# Patient Record
Sex: Female | Born: 1950
Health system: Southern US, Community
[De-identification: ages and names within clinical notes are randomized; demographics above are authoritative.]

## PROBLEM LIST (undated history)

## (undated) DIAGNOSIS — J45909 Unspecified asthma, uncomplicated: Secondary | ICD-10-CM

## (undated) DIAGNOSIS — E669 Obesity, unspecified: Secondary | ICD-10-CM

## (undated) DIAGNOSIS — K219 Gastro-esophageal reflux disease without esophagitis: Secondary | ICD-10-CM

## (undated) DIAGNOSIS — I10 Essential (primary) hypertension: Secondary | ICD-10-CM

## (undated) DIAGNOSIS — F329 Major depressive disorder, single episode, unspecified: Secondary | ICD-10-CM

## (undated) DIAGNOSIS — K5730 Diverticulosis of large intestine without perforation or abscess without bleeding: Secondary | ICD-10-CM

## (undated) DIAGNOSIS — G500 Trigeminal neuralgia: Secondary | ICD-10-CM

## (undated) DIAGNOSIS — K8020 Calculus of gallbladder without cholecystitis without obstruction: Secondary | ICD-10-CM

## (undated) DIAGNOSIS — C44311 Basal cell carcinoma of skin of nose: Secondary | ICD-10-CM

## (undated) DIAGNOSIS — F32A Depression, unspecified: Secondary | ICD-10-CM

## (undated) DIAGNOSIS — G4730 Sleep apnea, unspecified: Secondary | ICD-10-CM

## (undated) DIAGNOSIS — K635 Polyp of colon: Secondary | ICD-10-CM

## (undated) DIAGNOSIS — F419 Anxiety disorder, unspecified: Secondary | ICD-10-CM

## (undated) HISTORY — PX: ABDOMINAL HYSTERECTOMY: SHX81

## (undated) HISTORY — DX: Major depressive disorder, single episode, unspecified: F32.9

## (undated) HISTORY — DX: Depression, unspecified: F32.A

## (undated) HISTORY — PX: UPPER GASTROINTESTINAL ENDOSCOPY: SHX188

## (undated) HISTORY — DX: Calculus of gallbladder without cholecystitis without obstruction: K80.20

## (undated) HISTORY — DX: Unspecified asthma, uncomplicated: J45.909

## (undated) HISTORY — DX: Basal cell carcinoma of skin of nose: C44.311

## (undated) HISTORY — PX: COLONOSCOPY: SHX174

## (undated) HISTORY — DX: Essential (primary) hypertension: I10

## (undated) HISTORY — DX: Gastro-esophageal reflux disease without esophagitis: K21.9

## (undated) HISTORY — DX: Polyp of colon: K63.5

## (undated) HISTORY — DX: Sleep apnea, unspecified: G47.30

## (undated) HISTORY — PX: EYE SURGERY: SHX253

## (undated) HISTORY — DX: Obesity, unspecified: E66.9

## (undated) HISTORY — DX: Anxiety disorder, unspecified: F41.9

## (undated) HISTORY — DX: Diverticulosis of large intestine without perforation or abscess without bleeding: K57.30

## (undated) HISTORY — DX: Trigeminal neuralgia: G50.0

---

## 1999-08-17 HISTORY — PX: TOTAL ABDOMINAL HYSTERECTOMY W/ BILATERAL SALPINGOOPHORECTOMY: SHX83

## 1999-10-26 ENCOUNTER — Other Ambulatory Visit: Admission: RE | Admit: 1999-10-26 | Discharge: 1999-10-26 | Payer: Self-pay | Admitting: Obstetrics and Gynecology

## 2000-08-16 HISTORY — PX: CHOLECYSTECTOMY: SHX55

## 2000-09-29 ENCOUNTER — Encounter: Payer: Self-pay | Admitting: Internal Medicine

## 2000-11-29 ENCOUNTER — Other Ambulatory Visit: Admission: RE | Admit: 2000-11-29 | Discharge: 2000-11-29 | Payer: Self-pay | Admitting: Orthopedic Surgery

## 2001-02-02 ENCOUNTER — Other Ambulatory Visit: Admission: RE | Admit: 2001-02-02 | Discharge: 2001-02-02 | Payer: Self-pay | Admitting: Obstetrics and Gynecology

## 2003-10-14 ENCOUNTER — Ambulatory Visit (HOSPITAL_COMMUNITY): Admission: RE | Admit: 2003-10-14 | Discharge: 2003-10-14 | Payer: Self-pay | Admitting: Family Medicine

## 2003-10-14 ENCOUNTER — Encounter: Payer: Self-pay | Admitting: Family Medicine

## 2004-09-08 ENCOUNTER — Ambulatory Visit: Payer: Self-pay | Admitting: Family Medicine

## 2004-09-15 ENCOUNTER — Ambulatory Visit: Payer: Self-pay | Admitting: Family Medicine

## 2004-09-30 ENCOUNTER — Ambulatory Visit: Payer: Self-pay | Admitting: Internal Medicine

## 2004-11-23 ENCOUNTER — Ambulatory Visit: Payer: Self-pay | Admitting: Family Medicine

## 2004-11-25 ENCOUNTER — Encounter: Admission: RE | Admit: 2004-11-25 | Discharge: 2004-11-25 | Payer: Self-pay | Admitting: Family Medicine

## 2004-12-11 ENCOUNTER — Ambulatory Visit (HOSPITAL_COMMUNITY): Admission: RE | Admit: 2004-12-11 | Discharge: 2004-12-11 | Payer: Self-pay | Admitting: Neurology

## 2006-01-26 ENCOUNTER — Ambulatory Visit: Payer: Self-pay | Admitting: Family Medicine

## 2006-05-10 ENCOUNTER — Ambulatory Visit: Payer: Self-pay | Admitting: Family Medicine

## 2007-04-12 ENCOUNTER — Telehealth: Payer: Self-pay | Admitting: Family Medicine

## 2007-05-17 ENCOUNTER — Ambulatory Visit: Payer: Self-pay | Admitting: Family Medicine

## 2007-05-17 LAB — CONVERTED CEMR LAB
ALT: 23 units/L (ref 0–35)
AST: 22 units/L (ref 0–37)
Albumin: 4.2 g/dL (ref 3.5–5.2)
Alkaline Phosphatase: 58 units/L (ref 39–117)
BUN: 11 mg/dL (ref 6–23)
Basophils Absolute: 0 10*3/uL (ref 0.0–0.1)
Basophils Relative: 0.5 % (ref 0.0–1.0)
Bilirubin Urine: NEGATIVE
Bilirubin, Direct: 0.1 mg/dL (ref 0.0–0.3)
CO2: 31 meq/L (ref 19–32)
Calcium: 9.6 mg/dL (ref 8.4–10.5)
Chloride: 106 meq/L (ref 96–112)
Cholesterol: 207 mg/dL (ref 0–200)
Creatinine, Ser: 0.7 mg/dL (ref 0.4–1.2)
Direct LDL: 125.3 mg/dL
Eosinophils Absolute: 0.1 10*3/uL (ref 0.0–0.6)
Eosinophils Relative: 1.4 % (ref 0.0–5.0)
GFR calc Af Amer: 111 mL/min
GFR calc non Af Amer: 92 mL/min
Glucose, Bld: 94 mg/dL (ref 70–99)
Glucose, Urine, Semiquant: NEGATIVE
HCT: 36.7 % (ref 36.0–46.0)
HDL: 34.8 mg/dL — ABNORMAL LOW (ref >39.0)
Hemoglobin: 12.7 g/dL (ref 12.0–15.0)
Ketones, urine, test strip: NEGATIVE
Lymphocytes Relative: 20.7 % (ref 12.0–46.0)
MCHC: 34.7 g/dL (ref 30.0–36.0)
MCV: 83.7 fL (ref 78.0–100.0)
Monocytes Absolute: 0.4 10*3/uL (ref 0.2–0.7)
Monocytes Relative: 5.5 % (ref 3.0–11.0)
Neutro Abs: 4.8 10*3/uL (ref 1.4–7.7)
Neutrophils Relative %: 71.9 % (ref 43.0–77.0)
Nitrite: NEGATIVE
Platelets: 248 10*3/uL (ref 150–400)
Potassium: 4.7 meq/L (ref 3.5–5.1)
Protein, U semiquant: NEGATIVE
RBC: 4.39 M/uL (ref 3.87–5.11)
RDW: 13.2 % (ref 11.5–14.6)
Sodium: 143 meq/L (ref 135–145)
Specific Gravity, Urine: <1.005
TSH: 1.28 microintl units/mL (ref 0.35–5.50)
Total Bilirubin: 0.8 mg/dL (ref 0.3–1.2)
Total CHOL/HDL Ratio: 5.9
Total Protein: 6.8 g/dL (ref 6.0–8.3)
Triglycerides: 285 mg/dL (ref 0–149)
Urobilinogen, UA: 0.2
VLDL: 57 mg/dL — ABNORMAL HIGH (ref 0–40)
WBC: 6.7 10*3/uL (ref 4.5–10.5)
pH: 5.5

## 2007-05-24 ENCOUNTER — Ambulatory Visit: Payer: Self-pay | Admitting: Family Medicine

## 2007-05-24 DIAGNOSIS — R635 Abnormal weight gain: Secondary | ICD-10-CM | POA: Insufficient documentation

## 2007-05-24 DIAGNOSIS — F411 Generalized anxiety disorder: Secondary | ICD-10-CM | POA: Insufficient documentation

## 2007-05-24 DIAGNOSIS — F32A Depression, unspecified: Secondary | ICD-10-CM | POA: Insufficient documentation

## 2007-05-24 DIAGNOSIS — F329 Major depressive disorder, single episode, unspecified: Secondary | ICD-10-CM

## 2007-05-24 DIAGNOSIS — K5730 Diverticulosis of large intestine without perforation or abscess without bleeding: Secondary | ICD-10-CM | POA: Insufficient documentation

## 2008-01-29 ENCOUNTER — Ambulatory Visit: Payer: Self-pay | Admitting: Family Medicine

## 2008-04-01 ENCOUNTER — Encounter: Payer: Self-pay | Admitting: Family Medicine

## 2008-06-12 ENCOUNTER — Telehealth: Payer: Self-pay | Admitting: Family Medicine

## 2008-07-16 ENCOUNTER — Ambulatory Visit: Payer: Self-pay | Admitting: Family Medicine

## 2008-07-16 ENCOUNTER — Telehealth: Payer: Self-pay | Admitting: Family Medicine

## 2008-07-29 ENCOUNTER — Encounter: Payer: Self-pay | Admitting: *Deleted

## 2008-08-19 ENCOUNTER — Ambulatory Visit: Payer: Self-pay | Admitting: Family Medicine

## 2008-08-19 DIAGNOSIS — G500 Trigeminal neuralgia: Secondary | ICD-10-CM | POA: Insufficient documentation

## 2008-08-22 ENCOUNTER — Telehealth: Payer: Self-pay | Admitting: Family Medicine

## 2008-10-02 ENCOUNTER — Ambulatory Visit: Payer: Self-pay | Admitting: Family Medicine

## 2008-10-02 LAB — CONVERTED CEMR LAB
ALT: 21 units/L (ref 0–35)
AST: 21 units/L (ref 0–37)
Albumin: 4.1 g/dL (ref 3.5–5.2)
Alkaline Phosphatase: 63 units/L (ref 39–117)
BUN: 9 mg/dL (ref 6–23)
Basophils Absolute: 0 10*3/uL (ref 0.0–0.1)
Basophils Relative: 0.2 % (ref 0.0–3.0)
Bilirubin Urine: NEGATIVE
Bilirubin, Direct: 0.1 mg/dL (ref 0.0–0.3)
CO2: 26 meq/L (ref 19–32)
Calcium: 9.3 mg/dL (ref 8.4–10.5)
Chloride: 103 meq/L (ref 96–112)
Cholesterol: 192 mg/dL (ref 0–200)
Creatinine, Ser: 0.6 mg/dL (ref 0.4–1.2)
Direct LDL: 109.2 mg/dL
Eosinophils Absolute: 0.1 10*3/uL (ref 0.0–0.7)
Eosinophils Relative: 1.6 % (ref 0.0–5.0)
GFR calc Af Amer: 132 mL/min
GFR calc non Af Amer: 109 mL/min
Glucose, Bld: 91 mg/dL (ref 70–99)
Glucose, Urine, Semiquant: NEGATIVE
HCT: 39.3 % (ref 36.0–46.0)
HDL: 34.8 mg/dL — ABNORMAL LOW (ref >39.0)
Hemoglobin: 13.3 g/dL (ref 12.0–15.0)
Ketones, urine, test strip: NEGATIVE
Lymphocytes Relative: 20.3 % (ref 12.0–46.0)
MCHC: 33.9 g/dL (ref 30.0–36.0)
MCV: 85.8 fL (ref 78.0–100.0)
Monocytes Absolute: 0.4 10*3/uL (ref 0.1–1.0)
Monocytes Relative: 6.9 % (ref 3.0–12.0)
Neutro Abs: 4 10*3/uL (ref 1.4–7.7)
Neutrophils Relative %: 71 % (ref 43.0–77.0)
Nitrite: NEGATIVE
Platelets: 218 10*3/uL (ref 150–400)
Potassium: 3.9 meq/L (ref 3.5–5.1)
Protein, U semiquant: NEGATIVE
RBC: 4.58 M/uL (ref 3.87–5.11)
RDW: 12.9 % (ref 11.5–14.6)
Sodium: 139 meq/L (ref 135–145)
Specific Gravity, Urine: 1.01
TSH: 1.12 microintl units/mL (ref 0.35–5.50)
Total Bilirubin: 0.7 mg/dL (ref 0.3–1.2)
Total CHOL/HDL Ratio: 5.5
Total Protein: 7.3 g/dL (ref 6.0–8.3)
Triglycerides: 208 mg/dL (ref 0–149)
Urobilinogen, UA: 0.2
VLDL: 42 mg/dL — ABNORMAL HIGH (ref 0–40)
WBC: 5.7 10*3/uL (ref 4.5–10.5)
pH: 5

## 2008-10-11 ENCOUNTER — Ambulatory Visit: Payer: Self-pay | Admitting: Family Medicine

## 2009-09-01 ENCOUNTER — Telehealth: Payer: Self-pay | Admitting: Family Medicine

## 2009-11-11 ENCOUNTER — Telehealth: Payer: Self-pay | Admitting: Family Medicine

## 2009-11-26 ENCOUNTER — Ambulatory Visit: Payer: Self-pay | Admitting: Family Medicine

## 2009-11-26 LAB — CONVERTED CEMR LAB
ALT: 16 units/L (ref 0–35)
AST: 15 units/L (ref 0–37)
Albumin: 4.2 g/dL (ref 3.5–5.2)
Alkaline Phosphatase: 53 units/L (ref 39–117)
BUN: 13 mg/dL (ref 6–23)
Basophils Absolute: 0 10*3/uL (ref 0.0–0.1)
Basophils Relative: 0.3 % (ref 0.0–3.0)
Bilirubin Urine: NEGATIVE
Bilirubin, Direct: 0.1 mg/dL (ref 0.0–0.3)
CO2: 29 meq/L (ref 19–32)
Calcium: 9.1 mg/dL (ref 8.4–10.5)
Chloride: 104 meq/L (ref 96–112)
Cholesterol: 178 mg/dL (ref 0–200)
Creatinine, Ser: 0.5 mg/dL (ref 0.4–1.2)
Direct LDL: 63.1 mg/dL
Eosinophils Absolute: 0.1 10*3/uL (ref 0.0–0.7)
Eosinophils Relative: 1.4 % (ref 0.0–5.0)
GFR calc non Af Amer: 133.62 mL/min (ref >60)
Glucose, Bld: 97 mg/dL (ref 70–99)
Glucose, Urine, Semiquant: NEGATIVE
HCT: 35.3 % — ABNORMAL LOW (ref 36.0–46.0)
HDL: 35.4 mg/dL — ABNORMAL LOW (ref >39.00)
Hemoglobin: 12.3 g/dL (ref 12.0–15.0)
Ketones, urine, test strip: NEGATIVE
Lymphocytes Relative: 24.1 % (ref 12.0–46.0)
Lymphs Abs: 1.5 10*3/uL (ref 0.7–4.0)
MCHC: 35 g/dL (ref 30.0–36.0)
MCV: 83.9 fL (ref 78.0–100.0)
Monocytes Absolute: 0.3 10*3/uL (ref 0.1–1.0)
Monocytes Relative: 5.2 % (ref 3.0–12.0)
Neutro Abs: 4.2 10*3/uL (ref 1.4–7.7)
Neutrophils Relative %: 69 % (ref 43.0–77.0)
Nitrite: NEGATIVE
Platelets: 202 10*3/uL (ref 150.0–400.0)
Potassium: 4 meq/L (ref 3.5–5.1)
Protein, U semiquant: NEGATIVE
RBC: 4.21 M/uL (ref 3.87–5.11)
RDW: 13.9 % (ref 11.5–14.6)
Sodium: 139 meq/L (ref 135–145)
Specific Gravity, Urine: 1.02
TSH: 1.09 microintl units/mL (ref 0.35–5.50)
Total Bilirubin: 0.5 mg/dL (ref 0.3–1.2)
Total CHOL/HDL Ratio: 5
Total Protein: 7.2 g/dL (ref 6.0–8.3)
Triglycerides: 533 mg/dL — ABNORMAL HIGH (ref 0.0–149.0)
Urobilinogen, UA: 0.2
VLDL: 106.6 mg/dL — ABNORMAL HIGH (ref 0.0–40.0)
WBC Urine, dipstick: NEGATIVE
WBC: 6 10*3/uL (ref 4.5–10.5)
pH: 5

## 2009-12-03 ENCOUNTER — Ambulatory Visit: Payer: Self-pay | Admitting: Family Medicine

## 2009-12-03 ENCOUNTER — Encounter (INDEPENDENT_AMBULATORY_CARE_PROVIDER_SITE_OTHER): Payer: Self-pay | Admitting: *Deleted

## 2009-12-03 DIAGNOSIS — G4733 Obstructive sleep apnea (adult) (pediatric): Secondary | ICD-10-CM | POA: Insufficient documentation

## 2009-12-15 ENCOUNTER — Ambulatory Visit: Payer: Self-pay | Admitting: Family Medicine

## 2009-12-17 ENCOUNTER — Ambulatory Visit: Payer: Self-pay | Admitting: Family Medicine

## 2009-12-17 ENCOUNTER — Telehealth: Payer: Self-pay | Admitting: Family Medicine

## 2009-12-19 ENCOUNTER — Telehealth: Payer: Self-pay | Admitting: Internal Medicine

## 2009-12-19 ENCOUNTER — Ambulatory Visit: Payer: Self-pay | Admitting: Family Medicine

## 2009-12-19 DIAGNOSIS — K219 Gastro-esophageal reflux disease without esophagitis: Secondary | ICD-10-CM | POA: Insufficient documentation

## 2009-12-19 DIAGNOSIS — K449 Diaphragmatic hernia without obstruction or gangrene: Secondary | ICD-10-CM | POA: Insufficient documentation

## 2009-12-19 DIAGNOSIS — Z860100 Personal history of colon polyps, unspecified: Secondary | ICD-10-CM | POA: Insufficient documentation

## 2009-12-19 DIAGNOSIS — K649 Unspecified hemorrhoids: Secondary | ICD-10-CM | POA: Insufficient documentation

## 2009-12-25 ENCOUNTER — Ambulatory Visit: Payer: Self-pay | Admitting: Internal Medicine

## 2010-06-10 ENCOUNTER — Encounter (INDEPENDENT_AMBULATORY_CARE_PROVIDER_SITE_OTHER): Payer: Self-pay | Admitting: *Deleted

## 2010-06-11 LAB — HM MAMMOGRAPHY: HM Mammogram: NORMAL

## 2010-06-12 ENCOUNTER — Encounter: Payer: Self-pay | Admitting: Family Medicine

## 2010-07-15 ENCOUNTER — Encounter (INDEPENDENT_AMBULATORY_CARE_PROVIDER_SITE_OTHER): Payer: Self-pay | Admitting: *Deleted

## 2010-07-16 ENCOUNTER — Ambulatory Visit: Payer: Self-pay | Admitting: Internal Medicine

## 2010-07-28 ENCOUNTER — Ambulatory Visit: Payer: Self-pay | Admitting: Internal Medicine

## 2010-07-29 ENCOUNTER — Encounter: Payer: Self-pay | Admitting: Internal Medicine

## 2010-07-30 LAB — HM COLONOSCOPY

## 2010-09-05 ENCOUNTER — Encounter: Payer: Self-pay | Admitting: Family Medicine

## 2010-09-14 NOTE — Progress Notes (Addendum)
Summary: TRIAGE: Diverticulitits    Phone Note Call from Patient Call back at Home Phone [phone]   Call For: Dr Juanda Chance Reason for Call: Talk to Nurse Summary of Call: Really bad diverticulitis attack wonders if she can be worked in. 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 Initial call taken by: Leanor Kail [facility],  Dec 19, 2009 12:00 PM  Follow-up for Phone Call        Pt. can see Mike Gip [facility] today at 2:30pm.  Message left for patient to callback.  Follow-up by: Laureen Ochs LPN,  Dec 20, 2950 12:06 PM  Additional Follow-up for Phone Call Additional follow up Details #1::        Pt. states she has had diverticulitis and is on Cipro/Flagyl from her PCP, she is feeling much better, just wants to schedule a follow-up w/Dr.Colleen Donahoe to discuss possible surgery. She is scheduled for 12-25-09 at 4pm. Pt. instructed to call back as needed.  Additional Follow-up by: Laureen Ochs LPN,  Dec 20, 8411 12:46 PM

## 2010-09-14 NOTE — Miscellaneous (Addendum)
Summary: mammogram update   Clinical Lists Changes  Observations: Added new observation of MAMMOGRAM: normal (06/11/2010 11:06)      Preventive Care Screening  Mammogram:    Date:  06/11/2010    Results:  normal

## 2010-09-14 NOTE — Progress Notes (Addendum)
Summary: lexapro refill  Phone Note Refill Request Message from:  Fax from Pharmacy on August 22, 2008 11:04 AM  Refills Requested: Medication #1:  LEXAPRO 10 MG TABS Take 1 tablet by mouth once a day Initial call taken by: Kern Reap CMA,  August 22, 2008 11:04 AM      Prescriptions: LEXAPRO 10 MG TABS (ESCITALOPRAM OXALATE) Take 1 tablet by mouth once a day  #100 x 1   Entered by:   Kern Reap CMA   Authorized by:   Roderick Pee MD   Signed by:   Kern Reap CMA on 08/22/2008   Method used:   F[facility]ed to ...       [facility]       [address]       [place], [place]  [address]       Ph: [phone] or [phone]       Fax: [phone]   RxID:   [id number]

## 2010-09-14 NOTE — Assessment & Plan Note (Addendum)
Summary: Gastroenterology  Krystal Monroe   MR#:  [id number] Page #2  NAME:  Krystal Monroe, Krystal Monroe  OFFICE NO:  [id number]  DATE: 09/30/04  DOB: Jul 08, 2051  The patient is a 60 year old white female who is here today at Dr. Nelida Meuse recommendation because of intermittent discomfort in her chest when swallowing, suggestive of odynophagia.  She is aware of the food going down and there is actually a hesitation of the food at times, although there has never been any regurgitation of the food.  This has occurred during the day after meals, about once a week to once a month.  About a year ago, she was started on Nexium, which she took with complete resolution on her symptoms, but she discontinued the medicine.  This time Dr. Tawanna Cooler put her on Protonix 40 a day and she has been on it for the past 2 weeks.  She is not sure yet whether it has improved her symptoms.  Krystal Monroe was seen in the past in our office for gastroesophageal reflux in 1996 by Sharrell Ku, and actually underwent an upper endoscopy, which showed a 3 cm hiatal hernia.  There was no endoscopic evidence of stricture.  She since then has had some symptoms of reflux, although she does not have true heartburn during the day.  Her symptoms are more subtle.  She also had a cholecystectomy in 1996 by Dr. Orpah Greek and had 2 colonoscopies, one in 1996 for Hemoccult-positive stool and one in February 2002 with finding of a left colon polyp which was removed but not retrieved.  She is due for a colonoscopy' in February 2007.  PHYSICAL EXAMINATION:  Blood pressure 110/62, pulse 70, weight 172 pounds.  Her abdominal exam showed soft abdomen with no palpable mass or tenderness in epigastrium, liver edge was at margin costal margin.  Bowel sounds were normoactive.  Lower abdomen was unremarkable.  IMPRESSION:  A 60 year old white female with intermittent odynophagia suggestive of gastroesophageal reflux and possibly mild stricture.  She has a documented history of a hiatal hernia, and  I am suspecting that she has a fibrous ring at the gastroesophageal junction, which is partially obstructing at times depending on what she eats and how fast she eats.  Her eating habits are not very good.  She often eats very late and that may be precipitating more acid reflux.  Since she is not experiencing a heartburn during the day, I feel that her disease is more of a silent nocturnal reflux.   She feels that her symptoms are not severe enough to warrant any further evaluation at this time, but would be willing to have an endoscopy done at the time of her next colonoscopy, which would be in February 2007.  PLAN: 1.  Nexium 40 mg p.o. q. d.  The patient felt that Nexium was helping her more than Protonix. 2.  Colonoscopy scheduled for February 2007. 3.  Antireflux measures which include head of the bed elevation and eating early in the evening. 5.  Upper endoscopy at the time of colonoscopy.  If this symptom becomes more bothersome, we will proceed with endoscopy prior to that.  At the time of endoscopy, we would plan to obtain biopsies at the gastroesophageal junction to rule out Barrett's esophagus.      Hedwig Morton. Juanda Chance, M.D.  DMB/kl/cc cc:  Alonza Smoker, M.D. D:  09/30/04; T:  10/01/04; Job [phone]

## 2010-09-14 NOTE — Letter (Addendum)
Summary: Previsit letter  [facility]  [address] [place], [place] [address]   Phone: [phone]  Fax: [phone]       12/03/2009 MRN: [id number]  [facility] [address] [place], [place]  [address]  Dear Ms. Faulds,  Welcome to the Gastroenterology Division at [facility].    You are scheduled to see a nurse for your pre-procedure visit on 12-16-09 at 1:30p.m. on the 3rd floor at [facility], [address]  We ask that you try to arrive at our office 15 minutes prior to your appointment time to allow for check-in.  Your nurse visit will consist of discussing your medical and surgical history, your immediate family medical history, and your medications.    Please bring a complete list of all your medications or, if you prefer, bring the medication bottles and we will list them.  We will need to be aware of both prescribed and over the counter drugs.  We will need to know exact dosage information as well.  If you are on blood thinners (Coumadin, Plavix, Aggrenox, Ticlid, etc.) please call our office today/prior to your appointment, as we need to consult with your physician about holding your medication.   Please be prepared to read and sign documents such as consent forms, a financial agreement, and acknowledgement forms.  If necessary, and with your consent, a friend or relative is welcome to sit-in on the nurse visit with you.  Please bring your insurance card so that we may make a copy of it.  If your insurance requires a referral to see a specialist, please bring your referral form from your primary care physician.  No co-pay is required for this nurse visit.     If you cannot keep your appointment, please call [phone] to cancel or reschedule prior to your appointment date.  This allows [place] the opportunity to schedule an appointment for another patient in need of care.    Thank you for choosing [facility] for your medical needs.  We  appreciate the opportunity to care for you.  Please visit [place] at our website  to learn more about our practice.                     Sincerely.                                                                                                                   The Gastroenterology Division

## 2010-09-14 NOTE — Assessment & Plan Note (Addendum)
Summary: CPX/PAP/NJR   Vital Signs:  Patient profile:   60 year old female Menstrual status:  hysterectomy Height:      63 inches Weight:      180 pounds BMI:     32.00 Temp:     98.4 degrees F oral BP sitting:   120 / 80  (left arm) Cuff size:   regular  Vitals Entered By: Kern Reap CMA Duncan Dull) (December 03, 2009 8:42 AM) CC: cpx Is Patient Diabetic? No Pain Assessment Patient in pain? no          Menstrual Status hysterectomy   CC:  cpx.  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  Comes in today for physical vibration.  She takes Lexapro 10 mg nightly for mild depression.  Doing well.  She has a history of reflux esophagitis.  She is currently taking Prilosec 40 mg daily.  She ran out of her medication a week ago and started having severe reflux.  Again.  She tried over-the-counter Zantac, but it didn't help.  She also takes Trileptal 300 mg q.i.d. because of a history of trigeminal neuralgia.  She also uses Premarin vaginal cream for vaginal dryness.  She gets routine eye care.  Dental care does BSE monthly.  Gets any mammography.  She's had her uterus removed for nonmalignant reasons.  Tetanus 2010 seasonal flu 2010.  She's due for a follow-up screening colonoscopy.  Her biggest problem now is snoring.  She sleeps well at night.  Does not complain of daytime fatigue, but her family says she snores all night long.  It's gotten to a point where she would like an evaluation.  She had a skin lesion.  This year on her nose.  She went to a dermatologist.  It turned out to be a basal cell carcinoma.  She had Mohs surgery.  Successful  Allergies: No Known Drug Allergies  Past History:  Past medical, surgical, family and social histories (including risk factors) reviewed, and no changes noted (except as noted below).  Past Medical  History: Anxiety Depression Diverticulosis, colon Gerd status post TAH, BSO,  status post gallbladder removal history of trigeminal neuralgia hx basal cell - nose  Past Surgical History: Reviewed history from 10/11/2008 and no changes required. Cholecystectomy Hysterectomy Oophorectomy  Family History: Reviewed history from 10/11/2008 and no changes required. Family History of Anxiety Family History Depression  Social History: Reviewed history from 05/24/2007 and no changes required. Occupation: Press photographer div. Married Never Smoked Alcohol use-no Drug use-no Regular exercise-no  Review of Systems      See HPI  Physical Exam  General:  Well-developed,well-nourished,in no acute distress; alert,appropriate and cooperative throughout examination Head:  Normocephalic and atraumatic without obvious abnormalities. No apparent alopecia or balding. Eyes:  No corneal or conjunctival inflammation noted. EOMI. Perrla. Funduscopic exam benign, without hemorrhages, exudates or papilledema. Vision grossly normal. Ears:  External ear exam shows no significant lesions or deformities.  Otoscopic examination reveals clear canals, tympanic membranes are intact bilaterally without bulging, retraction, inflammation or discharge. Hearing is grossly normal bilaterally. Nose:  External nasal examination shows no deformity or inflammation. Nasal mucosa are pink and moist without lesions or exudates. Mouth:  Oral mucosa and oropharynx without lesions or exudates.  Teeth in good repair. Neck:  No deformities, masses, or tenderness noted. Chest Wall:  No deformities, masses, or tenderness noted. Breasts:  No mass, nodules, thickening, tenderness, bulging, retraction,  inflamation, nipple discharge or skin changes noted.   Lungs:  Normal respiratory effort, chest expands symmetrically. Lungs are clear to auscultation, no crackles or wheezes. Heart:  Normal rate and regular rhythm. S1 and S2 normal  without gallop, murmur, click, rub or other extra sounds. Abdomen:  Bowel sounds positive,abdomen soft and non-tender without masses, organomegaly or hernias noted. Rectal:  No external abnormalities noted. Normal sphincter tone. No rectal masses or tenderness. Genitalia:  Pelvic Exam:        External: normal female genitalia without lesions or masses        Vagina: normal without lesions or masses        Cervix: normal without lesions or masses        Adnexa: normal bimanual exam without masses or fullness        Uterus: normal by palpation        Pap smear: not performed Msk:  No deformity or scoliosis noted of thoracic or lumbar spine.   Pulses:  R and L carotid,radial,femoral,dorsalis pedis and posterior tibial pulses are full and equal bilaterally Extremities:  No clubbing, cyanosis, edema, or deformity noted with normal full range of motion of all joints.   Neurologic:  No cranial nerve deficits noted. Station and gait are normal. Plantar reflexes are down-going bilaterally. DTRs are symmetrical throughout. Sensory, motor and coordinative functions appear intact. Skin:  Intact without suspicious lesions or rashes Cervical Nodes:  No lymphadenopathy noted Axillary Nodes:  No palpable lymphadenopathy Inguinal Nodes:  No significant adenopathy Psych:  Cognition and judgment appear intact. Alert and cooperative with normal attention span and concentration. No apparent delusions, illusions, hallucinations   Impression & Recommendations:  Problem # 1:  NEURALGIA, TRIGEMINAL (ICD-350.1) Assessment Improved  Orders: Prescription Created Electronically [id number])  Problem # 2:  DEPRESSION (ICD-311) Assessment: Improved  Her updated medication list for this problem includes:    Lexapro 10 Mg Tabs (Escitalopram oxalate) .Marland Kitchen... Take 1 tablet by mouth once a day  Orders: Prescription Created Electronically [id number])  Problem # 3:  HEALTH SCREENING (ICD-V70.0) Assessment:  Unchanged  Orders: Prescription Created Electronically [phone]) EKG w/ Interpretation ([address])  Problem # 4:  HYPERSOMNIA WITH SLEEP APNEA UNSPECIFIED (ICD-780.53) Assessment: New  Orders: Pulmonary Referral (Pulmonary) Prescription Created Electronically [phone] [id number]) EKG w/ Interpretation ([address])  Complete Medication List: 1)  Lexapro 10 Mg Tabs (Escitalopram oxalate) .... Take 1 tablet by mouth once a day 2)  Prilosec 40 Mg Cpdr (Omeprazole) .... Once daily 3)  Metronidazole 500 Mg Tabs (Metronidazole) .... Take 1 tablet by mouth two times a day 4)  Trileptal 300 Mg Tabs (Oxcarbazepine) .... Take one tab four times a day 5)  Premarin 0.625 Mg/gm Crea (Estrogens, conjugated) .... Appply weekly  Other Orders: Gastroenterology Referral (GI)  Patient Instructions: 1)  Please schedule a follow-up appointment in 1 year. 2)  It is important that you exercise regularly at least 20 minutes 5 times a week. If you develop chest pain, have severe difficulty breathing, or feel very tired , stop exercising immediately and seek medical attention. 3)  Schedule your mammogram. 4)  Schedule a colonoscopy/sigmoidoscopy to help detect colon cancer. 5)  Take calcium +Vitamin D daily. 6)  do a thorough skin exam monthly when you do your breast exam. 7)  I will call and get his for a consult with Mellody Dance about the snoring.  Issue.  Also put a note for Dora for follow-up colonoscopy Prescriptions: PREMARIN 0.625 MG/GM CREA (ESTROGENS, CONJUGATED) appply weekly  #3 tubes x  3   Entered and Authorized by:   Roderick Pee MD   Signed by:   Roderick Pee MD on 12/03/2009   Method used:   Electronically to        [facility]* (retail)       [address]       [address] Shipping/mailing       [place], [place]  [address]       Ph: [phone]       Fax: [phone]   RxID:   [id number] TRILEPTAL 300 MG TABS (OXCARBAZEPINE) take one tab four times a day  #360 x 3   Entered and  Authorized by:   Roderick Pee MD   Signed by:   Roderick Pee MD on 12/03/2009   Method used:   Electronically to        [facility]* (retail)       [address]       [address] Shipping/mailing       [place], [place]  [address]       Ph: [phone]       Fax: [phone]   RxID:   [id number] PRILOSEC 40 MG  CPDR (OMEPRAZOLE) once daily  #100 x 3   Entered and Authorized by:   Roderick Pee MD   Signed by:   Roderick Pee MD on 12/03/2009   Method used:   Electronically to        [facility]* (retail)       [address]       [address] Shipping/mailing       [place], [place]  [address]       Ph: [phone]       Fax: [phone]   RxID:   [id number] LEXAPRO 10 MG TABS (ESCITALOPRAM OXALATE) Take 1 tablet by mouth once a day  #100 x 3   Entered and Authorized by:   Roderick Pee MD   Signed by:   Roderick Pee MD on 12/03/2009   Method used:   Electronically to        [facility]* (retail)       [address]       [address]/mail[address]       [place], [place]  [address]       Ph: [phone]       Fax: [phone]   RxID:   [id number]

## 2010-09-14 NOTE — Assessment & Plan Note (Addendum)
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  She states today she starting to feel better.  Her pain is less.  She continues to stay on a soft diet and take her medication.  This in hindsight, is the worst episode of diverticulitis, that she's had.  We discussed options going forward, which would include consideration of a partial colectomy.  I'm concerned because of severe nature of her episodes that she could perforate and then require emergency surgery.  She had a screening colonoscopy set up with Dr. Dickie La, the 19th of this month.  Instead will get her set up for a office consult.  Allergies: No Known Drug Allergies  Past History:  Past medical, surgical, family and social histories (including risk factors) reviewed for relevance to current acute and chronic problems.  Past Medical History: Reviewed history from 12/03/2009 and no changes required. Anxiety Depression Diverticulosis, colon Gerd status post TAH, BSO,  status post gallbladder removal history of trigeminal neuralgia hx basal cell - nose  Past Surgical History: Reviewed history from 10/11/2008 and no changes required. Cholecystectomy Hysterectomy Oophorectomy  Family History: Reviewed history from 10/11/2008 and no changes required. Family History of Anxiety Family History Depression  Social History: Reviewed history from 05/24/2007 and no changes required. Occupation: Press photographer div. Married Never Smoked Alcohol use-no Drug use-no Regular exercise-no  Review of Systems      See HPI  Physical  Exam  General:  Well-developed,well-nourished,in no acute distress; alert,appropriate and cooperative throughout examination Abdomen:  the abdomen is no longer bloated.  Bowel sounds normal tenderness.  Left lower quadrant.  No rebound   Impression & Recommendations:  Problem # 1:  DIVERTICULITIS, COLON, NOS (ICD-562.11) Assessment Improved  Complete Medication List: 1)  Lexapro 10 Mg Tabs (Escitalopram oxalate) .... Take 1 tablet by mouth once a day 2)  Prilosec 40 Mg Cpdr (Omeprazole) .... Once daily 3)  Metronidazole 500 Mg Tabs (Metronidazole) .... Take 1 tablet by mouth two times a day 4)  Trileptal 300 Mg Tabs (Oxcarbazepine) .... Take one tab four times a day 5)  Premarin 0.625 Mg/gm Crea (Estrogens, conjugated) .... Appply weekly 6)  Ciprofloxacin Hcl 500 Mg Tabs (Ciprofloxacin hcl) .... Take 1 tablet by mouth two times a day  Patient Instructions: 1)  continue current medications.  See Dr. Dickie La in two weeks for follow-up.

## 2010-09-14 NOTE — Letter (Addendum)
Summary: [facility] Instructions  [facility]  [address] [place], [place] [address]   Phone: [phone]  Fax: [phone]       Krystal Monroe    06/20/1951    MRN: [id number]       Procedure Day /Date:  Tuesday 07/28/2010     Arrival Time: 9:30 am     Procedure Time:  10:30 am     Location of Procedure:                    _x _  [facility] (4th Floor)   PREPARATION FOR COLONOSCOPY WITH MIRALAX  Starting 5 days prior to your procedure Thursday 12/8 do not eat nuts, seeds, popcorn, corn, beans, peas,  salads, or any raw vegetables.  Do not take any fiber supplements (e.g. Metamucil, Citrucel, and Benefiber). ____________________________________________________________________________________________________   THE DAY BEFORE YOUR PROCEDURE         DATE: Monday 12/12  1   Drink clear liquids the entire day-NO SOLID FOOD  2   Do not drink anything colored red or purple.  Avoid juices with pulp.  No orange juice.  3   Drink at least 64 oz. (8 glasses) of fluid/clear liquids during the day to prevent dehydration and help the prep work efficiently.  CLEAR LIQUIDS INCLUDE: Water Jello Ice Popsicles Tea (sugar ok, no milk/cream) Powdered fruit flavored drinks Coffee (sugar ok, no milk/cream) Gatorade Juice: apple, white grape, white cranberry  Lemonade Clear bullion, consomm, broth Carbonated beverages (any kind) Strained chicken noodle soup Hard Candy  4   Mix the entire bottle of Miralax with 64 oz. of Gatorade/Powerade in the morning and put in the refrigerator to chill.  5   At 3:00 pm take 2 Dulcolax/Bisacodyl tablets.  6   At 4:30 pm take one Reglan/Metoclopramide tablet.  7  Starting at 5:00 pm drink one 8 oz glass of the Miralax mixture every 15-20 minutes until you have finished drinking the entire 64 oz.  You should finish drinking prep around 7:30 or 8:00 pm.  8   If you are nauseated, you may take the 2nd Reglan/Metoclopramide  tablet at 6:30 pm.        9    At 8:00 pm take 2 more DULCOLAX/Bisacodyl tablets.     THE DAY OF YOUR PROCEDURE      DATE:  Tuesday 12/13  You may drink clear liquids until 8:30 am   (2 HOURS BEFORE PROCEDURE).   MEDICATION INSTRUCTIONS  Unless otherwise instructed, you should take regular prescription medications with a small sip of water as early as possible the morning of your procedure.         OTHER INSTRUCTIONS  You will need a responsible adult at least 62 years of age to accompany you and drive you home.   This person must remain in the waiting room during your procedure.  Wear loose fitting clothing that is easily removed.  Leave jewelry and other valuables at home.  However, you may wish to bring a book to read or an iPod/MP3 player to listen to music as you wait for your procedure to start.  Remove all body piercing jewelry and leave at home.  Total time from sign-in until discharge is approximately 2-3 hours.  You should go home directly after your procedure and rest.  You can resume normal activities the day after your procedure.  The day of your procedure you should not:   Drive  Make legal decisions   Operate machinery   Drink alcohol   Return to work  You will receive specific instructions about eating, activities and medications before you leave.   The above instructions have been reviewed and explained to me by   Wyona Almas RN  July 16, 2010 9:31 AM     I fully understand and can verbalize these instructions _____________________________ Date _______

## 2010-09-14 NOTE — Progress Notes (Addendum)
Summary: DOC PLEASE CALL  Medications Added LEXAPRO 10 MG TABS (ESCITALOPRAM OXALATE) Take 1 tablet by mouth once a day LIPITOR 20 MG TABS (ATORVASTATIN CALCIUM) Take 1 tablet by mouth once a day       Phone Note Call from Patient Call back at Home Phone [phone]   Caller: Patient Call For: DR TODD Summary of Call: PT WOULD LIKE DR TODD TO CALL HER REGARDING HER DAUGHTER ANNIE Initial call taken by: Heron Sabins,  April 12, 2007 10:13 AM  Follow-up for Phone Call        Phone Call Completed Follow-up by: Roderick Pee MD,  April 12, 2007 11:59 AM    New/Updated Medications: LEXAPRO 10 MG TABS (ESCITALOPRAM OXALATE) Take 1 tablet by mouth once a day LIPITOR 20 MG TABS (ATORVASTATIN CALCIUM) Take 1 tablet by mouth once a day

## 2010-09-14 NOTE — Letter (Addendum)
Summary: Pre Visit Letter Revised  [facility]  [address] [place], [place] [address]   Phone: [phone]  Fax: [phone]        06/10/2010 MRN: [id number] Krystal Monroe [address] [place], [place]  [address]             Procedure Date:  07/28/2010   Welcome to the Gastroenterology Division at [facility].    You are scheduled to see a nurse for your pre-procedure visit on 07/16/2010 at 9:00AM on the 3rd floor at [facility], [address]  We ask that you try to arrive at our office 15 minutes prior to your appointment time to allow for check-in.  Please take a minute to review the attached form.  If you answer "Yes" to one or more of the questions on the first page, we ask that you call the person listed at your earliest opportunity.  If you answer "No" to all of the questions, please complete the rest of the form and bring it to your appointment.    Your nurse visit will consist of discussing your medical and surgical history, your immediate family medical history, and your medications.   If you are unable to list all of your medications on the form, please bring the medication bottles to your appointment and we will list them.  We will need to be aware of both prescribed and over the counter drugs.  We will need to know exact dosage information as well.    Please be prepared to read and sign documents such as consent forms, a financial agreement, and acknowledgement forms.  If necessary, and with your consent, a friend or relative is welcome to sit-in on the nurse visit with you.  Please bring your insurance card so that we may make a copy of it.  If your insurance requires a referral to see a specialist, please bring your referral form from your primary care physician.  No co-pay is required for this nurse visit.     If you cannot keep your appointment, please call [phone] to cancel or reschedule prior to your appointment date.  This allows [place] the  opportunity to schedule an appointment for another patient in need of care.    Thank you for choosing [facility] for your medical needs.  We appreciate the opportunity to care for you.  Please visit [place] at our website  to learn more about our practice.  Sincerely, The Gastroenterology Division

## 2010-09-14 NOTE — Progress Notes (Addendum)
Summary: rx refill  Phone Note Refill Request Message from:  Fax from Pharmacy on June 12, 2008 10:53 AM  Refills Requested: Medication #1:  LEXAPRO 10 MG TABS Take 1 tablet by mouth once a day  Medication #2:  PRILOSEC 40 MG  CPDR once daily  Method Requested: Electronic Initial call taken by: Kern Reap CMA,  June 12, 2008 10:53 AM      Prescriptions: PRILOSEC 40 MG  CPDR (OMEPRAZOLE) once daily  #100 x 0   Entered by:   Kern Reap CMA   Authorized by:   Roderick Pee MD   Signed by:   Kern Reap CMA on 06/12/2008   Method used:   Faxed to ...       Hospital doctor (retail)       [address]       [place], [place]  [address]       Ph: [phone] or [phone]       Fax: [phone]   RxID:   [id number] LEXAPRO 10 MG TABS (ESCITALOPRAM OXALATE) Take 1 tablet by mouth once a day  #100 x 1   Entered by:   Kern Reap CMA   Authorized by:   Roderick Pee MD   Signed by:   Kern Reap CMA on 06/12/2008   Method used:   Faxed to ...       Hospital doctor (retail)       [address]       [place], [place]  [address]       Ph: [phone] or [phone]       Fax: [phone]   RxID:   [id number]

## 2010-09-14 NOTE — Progress Notes (Addendum)
Summary: trileptal  Phone Note Refill Request Message from:  Fax from Pharmacy on September 01, 2009 2:20 PM  Refills Requested: Medication #1:  TRILEPTAL 300 MG TABS take one tab four times a day Initial call taken by: Kern Reap CMA Duncan Dull),  September 01, 2009 2:20 PM    Prescriptions: TRILEPTAL 300 MG TABS (OXCARBAZEPINE) take one tab four times a day  #120 x 0   Entered by:   Kern Reap CMA (AAMA)   Authorized by:   Roderick Pee MD   Signed by:   Kern Reap CMA (AAMA) on 09/01/2009   Method used:   Faxed to ...       Hospital doctor (retail)       [address]       [place], [place]  [address]       Ph: [phone] or [phone]       Fax: [phone]   RxID:   [id number]

## 2010-09-14 NOTE — Procedures (Addendum)
Summary: COLON   Colonoscopy  Procedure date:  09/29/2000  Findings:      Location:  [facility].    Procedures Next Due Date:    Colonoscopy: 09/2005 Patient Name: Krystal Monroe MRN:  Procedure Procedures: Colonoscopy CPT: 69629.    with polypectomy. CPT: A3573898.  Personnel: Endoscopist: Dora L. Juanda Chance, MD.  Referred By: Dr. Kendrick Ranch.  Exam Location: Exam performed in Outpatient Clinic. Outpatient  Patient Consent: Procedure, Alternatives, Risks and Benefits discussed, consent obtained, from patient.  Indications  Evaluation of: faile flex.sigmoidoscopy due to pain, hx of diverticulitis several years ago requiring hospitalization.  History  Pre-Exam Physical: Performed Sep 29, 2000. Cardio-pulmonary exam, Rectal exam, HEENT exam , Abdominal exam, Extremity exam, Neurological exam, Mental status exam WNL.  Exam Exam: Extent of exam reached: Cecum, extent intended: Cecum.  Colon retroflexion performed. Images taken. ASA Classification: I. Tolerance: excellent.  Monitoring: Pulse and BP monitoring, Oximetry used. Supplemental O2 given.  Colon Prep Used Golytely for colon prep. Prep results: excellent.  Sedation Meds: Fentanyl 100 mcg. Versed 10 mg.  Findings POLYP: Descending Colon, Maximum size: 9 mm. sessile polyp. Distance from Anus 50 cm. Procedure:  snare with cautery, removed, not retrieved, ICD9: Colon Polyps: 211.3.  NORMAL EXAM: Cecum.  - DIVERTICULOSIS: Sigmoid Colon. Not bleeding. ICD9: Diverticulosis: 562.10. Comments: moderately severe diverticulosis.   Assessment Abnormal examination, see findings above.  Diagnoses: 562.10: Diverticulosis.  211.3: Colon Polyps.   Events  Unplanned Interventions: No intervention was required.  Unplanned Events: There were no complications. Plans Medication Plan: Fiber supplements: Methylcellulose 1 Tbsp QD, starting Sep 29, 2000   Patient Education: Patient given standard instructions  for: Diverticulosis. Yearly hemoccult testing recommended. Patient instructed to get routine colonoscopy every 5 years.  Disposition: After procedure patient sent to recovery. After recovery patient sent home.   This report was created from the original endoscopy report, which was reviewed and signed by the above listed endoscopist.

## 2010-09-14 NOTE — Miscellaneous (Addendum)
  Medications Added TRILEPTAL 300 MG/5ML SUSP (OXCARBAZEPINE) take one tab three times a day       Clinical Lists Changes  Medications: Removed medication of TRILEPTAL 150 MG  TABS (OXCARBAZEPINE) two times a day Added new medication of TRILEPTAL 300 MG/5ML SUSP (OXCARBAZEPINE) take one tab three times a day

## 2010-09-14 NOTE — Miscellaneous (Addendum)
Summary: [facility] Previsit/prep  Clinical Lists Changes  Medications: Added new medication of DULCOLAX 5 MG  TBEC (BISACODYL) Day before procedure take 2 at 3pm and 2 at 8pm. - Signed Added new medication of METOCLOPRAMIDE HCL 10 MG  TABS (METOCLOPRAMIDE HCL) As per prep instructions. - Signed Added new medication of MIRALAX   POWD (POLYETHYLENE GLYCOL 3350) As per prep  instructions. - Signed Rx of DULCOLAX 5 MG  TBEC (BISACODYL) Day before procedure take 2 at 3pm and 2 at 8pm.;  #4 x 0;  Signed;  Entered by: Wyona Almas RN;  Authorized by: Hart Carwin MD;  Method used: Electronically to [facility]*, [address] Shipping/mailing, [place], [place]  [address], Ph: [phone], Fax: [phone] Rx of METOCLOPRAMIDE HCL 10 MG  TABS (METOCLOPRAMIDE HCL) As per prep instructions.;  #2 x 0;  Signed;  Entered by: Wyona Almas RN;  Authorized by: Hart Carwin MD;  Method used: Electronically to [facility]*, [address] Shipping/mailing, [place], [place]  [address], Ph: [phone], Fax: [phone] Rx of MIRALAX   POWD (POLYETHYLENE GLYCOL 3350) As per prep  instructions.;  #255gm x 0;  Signed;  Entered by: Wyona Almas RN;  Authorized by: Hart Carwin MD;  Method used: Electronically to [facility]*, [address] Shipping/mailing, [place], [place]  [address], Ph: [phone], Fax: [phone] Observations: Added new observation of NKA: T (07/16/2010 9:03)    Prescriptions: MIRALAX   POWD (POLYETHYLENE GLYCOL 3350) As per prep  instructions.  #255gm x 0   Entered by:   Wyona Almas RN   Authorized by:   Hart Carwin MD   Signed by:   Wyona Almas RN on 07/16/2010   Method used:   Electronically to        [facility]* (retail)       [address]       [address] Shipping/mailing       [place], [place]  [address]       Ph: [phone]       Fax: [phone]  RxID:   [id number] METOCLOPRAMIDE HCL 10 MG  TABS (METOCLOPRAMIDE HCL) As per prep instructions.  #2 x 0   Entered by:   Wyona Almas RN   Authorized by:   Hart Carwin MD   Signed by:   Wyona Almas RN on 07/16/2010   Method used:   Electronically to        [facility]* (retail)       [address]       [address] Shipping/mailing       [place], [place]  [address]       Ph: [phone]       Fax: [phone]   RxID:   [id number] DULCOLAX 5 MG  TBEC (BISACODYL) Day before procedure take 2 at 3pm and 2 at 8pm.  #4 x 0   Entered by:   Wyona Almas RN   Authorized by:   Hart Carwin MD   Signed by:   Wyona Almas RN on 07/16/2010   Method used:   Electronically to        [facility]* (retail)       [address]       [address] Shipping/mailing       [place], [place]  [address]       Ph: [phone]       Fax: [phone]   RxID:   [id number]

## 2010-09-14 NOTE — Progress Notes (Addendum)
Summary: oxcarbazepine refill  Phone Note Refill Request   Refills Requested: Medication #1:  TRILEPTAL 150 MG  TABS two times a day Initial call taken by: Kern Reap CMA,  July 16, 2008 2:47 PM      Prescriptions: TRILEPTAL 150 MG  TABS (OXCARBAZEPINE) two times a day  #200 x 4   Entered by:   Kern Reap CMA   Authorized by:   Roderick Pee MD   Signed by:   Kern Reap CMA on 07/16/2008   Method used:   Faxed to ...       Hospital doctor (retail)       [address]       [place], [place]  [address]       Ph: [phone] or [phone]       Fax: [phone]   RxID:   [id number]

## 2010-09-14 NOTE — Assessment & Plan Note (Addendum)
Summary: tick bite and talk to doctor/mhf   Vital Signs:  Patient Profile:   60 Years Old Female Height:     62.5 inches (158.75 cm) Weight:      182 pounds Temp:     98.7 degrees F oral BP sitting:   132 / 80  (left arm)  Vitals Entered By: Kern Reap CMA (January 29, 2008 2:44 PM)                 Chief Complaint:  tick bite.  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  She noticed a tick bite on her left groin about 4 days ago.  She is on her way to [place] to visit with her daughter and wants to know how to address this issue.  She also has episodic panic attacks would like some medication for traveling.  She says the tic.  The small like to have the pin so it may have been a deer tick.  We talked about [facility] spotted fever and Lyme's disease    Current Allergies: No known allergies   Past Medical History:    Reviewed history from 05/24/2007 and no changes required:       Anxiety       Depression       Diverticulosis, colon       Gerd       status post TAH, BSO,        status post gallbladder removal       history of trigeminal neuralgia     Review of Systems      See HPI   Physical Exam  General:     Well-developed,well-nourished,in no acute distress; alert,appropriate and cooperative throughout examination Skin:     bug bite, left groin.  No lymphadenopathy    Impression & Recommendations:  Problem # 1:  ANXIETY (ICD-300.00) Assessment: Deteriorated  Her updated medication list for this problem includes:    Lexapro 10 Mg Tabs (Escitalopram oxalate) .Marland Kitchen... Take 1 tablet by mouth once a day    Ativan 0.5 Mg Tabs (Lorazepam) ..... Uad   Problem # 2:  TICK BITE (ICD-E906.4) Assessment: New  Complete Medication List: 1)  Lexapro 10 Mg Tabs (Escitalopram oxalate) .... Take 1 tablet by mouth once a day 2)  Trileptal 150 Mg Tabs (Oxcarbazepine)  .... Two times a day 3)  Prilosec 40 Mg Cpdr (Omeprazole) .... Once daily 4)  Doxycycline Hyclate 100 Mg Tabs (Doxycycline hyclate) .... Take 1 tablet by mouth two times a day 5)  Ativan 0.5 Mg Tabs (Lorazepam) .... Uad   Patient Instructions: 1)  if in the next two weeks.  He start running fever begin doxycycline hundred milligrams twice a day for two weeks. 2)  If you have symptoms of Lyme's disease and back and see [place] for further evaluation. 3)  I will also write a prescription for Ativan .5 to take it as directed for the plane flight   Prescriptions: ATIVAN 0.5 MG  TABS (LORAZEPAM) UAD  #30 x 1   Entered and Authorized by:   Roderick Pee MD   Signed by:   Roderick Pee MD on 01/29/2008   Method used:   Print then Give to Patient   RxID:   [id number] DOXYCYCLINE HYCLATE 100 MG  TABS (DOXYCYCLINE HYCLATE) Take 1 tablet by mouth two times a day  #30 x 1  Entered and Authorized by:   Roderick Pee MD   Signed by:   Roderick Pee MD on 01/29/2008   Method used:   Print then Give to Patient   RxID:   [id number]  ]

## 2010-09-14 NOTE — Assessment & Plan Note (Addendum)
Summary: F/U//ALP   Vital Signs:  Patient profile:   60 year old female Menstrual status:  hysterectomy Weight:      176 pounds Temp:     98.6 degrees F oral BP sitting:   110 / 80  (left arm) Cuff size:   regular  Vitals Entered By: Kern Reap CMA Duncan Dull) (Dec 17, 2009 8:26 AM) CC: follow-up visit   CC:  follow-up visit.  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  She was seen on Monday with a flare of her diverticulitis.  Exam that time was negative except for some tenderness in her left lower quadrant with no rebound.  We put her on Flagyl and Cipro.  She states last night.  She felt warm but her body temperature was 98.  No chills.  She continues to feel bloated  Allergies: No Known Drug Allergies  Review of Systems      See HPI  Physical Exam  General:  Well-developed,well-nourished,in no acute distress; alert,appropriate and cooperative throughout examination Abdomen:  the abdomen appears bloated bowel sounds are normal.  Tenderness.  Persistent left lower quadrant.  No rebound   Impression & Recommendations:  Problem # 1:  DIVERTICULITIS, COLON, NOS (ICD-562.11) Assessment Unchanged  Orders: T-Abdomen 2-view (74020TC)  Complete Medication List: 1)  Lexapro 10 Mg Tabs (Escitalopram oxalate) .... Take 1 tablet by mouth once a day 2)  Prilosec 40 Mg Cpdr (Omeprazole) .... Once daily 3)  Metronidazole 500 Mg Tabs (Metronidazole) .... Take 1 tablet by mouth two times a day 4)  Trileptal 300 Mg Tabs (Oxcarbazepine) .... Take one tab four times a day 5)  Premarin 0.625 Mg/gm Crea (Estrogens, conjugated) .... Appply weekly 6)  Ciprofloxacin Hcl 500 Mg Tabs (Ciprofloxacin hcl) .... Take 1 tablet by mouth two times a day  Patient Instructions: 1)  full liquid diet, rest at home, continue the oral antibiotics.  Follow-up Friday, 815

## 2010-09-14 NOTE — Assessment & Plan Note (Addendum)
Summary: cpx/pap/njr   Vital Signs:  Patient Profile:   60 Years Old Female Height:     62.75 inches (158.75 cm) Weight:      182 pounds Temp:     98.5 degrees F oral BP sitting:   120 / 80  (left arm)  Vitals Entered By: Kern Reap CMA (October 11, 2008 2:50 PM)                 Chief Complaint:  cpx.  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  She takes Lexapro 10 mg daily, and her mood is good.  She is sleeping well.  She takes Prilosec 40 mg daily for reflux esophagitis.  Currently asymptomatic.  She is also on Trileptal 300 mg 4 times a day because of trigeminal neuralgia.  She is currently asymptomatic and pain free.  She is 9 years postmenopausal and is having trouble dried vagina.  Would give her some hormonal cream.  Last tetanus fissure 2000.  She had both a flu shot in the fall.  Will give her a tetanus booster today.    Updated Prior Medication List: LEXAPRO 10 MG TABS (ESCITALOPRAM OXALATE) Take 1 tablet by mouth once a day PRILOSEC 40 MG  CPDR (OMEPRAZOLE) once daily METRONIDAZOLE 500 MG TABS (METRONIDAZOLE) Take 1 tablet by mouth two times a day TRILEPTAL 300 MG TABS (OXCARBAZEPINE) take one tab four times a day  Current Allergies (reviewed today): No known allergies   Past Medical History:    Reviewed history from 05/24/2007 and no changes required:       Anxiety       Depression       Diverticulosis, colon       Gerd       status post TAH, BSO,        status post gallbladder removal       history of trigeminal neuralgia  Past Surgical History:    Reviewed history and no changes required:       Cholecystectomy       Hysterectomy       Oophorectomy   Family History:    Reviewed history and no changes required:       Family  History of Anxiety       Family History Depression  Social History:    Reviewed history from 05/24/2007 and no changes required:       Occupation: Press photographer div.       Married       Never Smoked       Alcohol use-no       Drug use-no       Regular exercise-no    Review of Systems      See HPI   Physical Exam  General:     Well-developed,well-nourished,in no acute distress; alert,appropriate and cooperative throughout examination Head:     Normocephalic and atraumatic without obvious abnormalities. No apparent alopecia or balding. Eyes:     No corneal or conjunctival inflammation noted. EOMI. Perrla. Funduscopic exam benign, without hemorrhages, exudates or papilledema. Vision grossly normal. Ears:     External ear exam shows no significant lesions or deformities.  Otoscopic examination reveals clear canals, tympanic membranes are intact bilaterally without bulging, retraction, inflammation or discharge. Hearing is grossly normal bilaterally. Nose:  External nasal examination shows no deformity or inflammation. Nasal mucosa are pink and moist without lesions or exudates. Mouth:     Oral mucosa and oropharynx without lesions or exudates.  Teeth in good repair. Neck:     No deformities, masses, or tenderness noted. Chest Wall:     No deformities, masses, or tenderness noted. Breasts:     No mass, nodules, thickening, tenderness, bulging, retraction, inflamation, nipple discharge or skin changes noted.   Lungs:     Normal respiratory effort, chest expands symmetrically. Lungs are clear to auscultation, no crackles or wheezes. Heart:     Normal rate and regular rhythm. S1 and S2 normal without gallop, murmur, click, rub or other extra sounds. Abdomen:     Bowel sounds positive,abdomen soft and non-tender without masses, organomegaly or hernias noted. Rectal:     No external abnormalities noted. Normal sphincter tone. No rectal masses or tenderness. Genitalia:      Pelvic Exam:        External: normal female genitalia without lesions or masses        Vagina: normal without lesions or masses        Cervix: normal without lesions or masses        Adnexa: normal bimanual exam without masses or fullness        Uterus: normal by palpation        Pap smear: not performed Msk:     No deformity or scoliosis noted of thoracic or lumbar spine.   Pulses:     R and L carotid,radial,femoral,dorsalis pedis and posterior tibial pulses are full and equal bilaterally Extremities:     No clubbing, cyanosis, edema, or deformity noted with normal full range of motion of all joints.   Neurologic:     No cranial nerve deficits noted. Station and gait are normal. Plantar reflexes are down-going bilaterally. DTRs are symmetrical throughout. Sensory, motor and coordinative functions appear intact. Skin:     Intact without suspicious lesions or rashes Cervical Nodes:     No lymphadenopathy noted Axillary Nodes:     No palpable lymphadenopathy Inguinal Nodes:     No significant adenopathy Psych:     Cognition and judgment appear intact. Alert and cooperative with normal attention span and concentration. No apparent delusions, illusions, hallucinations    Impression & Recommendations:  Problem # 1:  NEURALGIA, TRIGEMINAL (ICD-350.1) Assessment: Improved  Problem # 2:  DEPRESSION (ICD-311) Assessment: Improved  Her updated medication list for this problem includes:    Lexapro 10 Mg Tabs (Escitalopram oxalate) .Marland Kitchen... Take 1 tablet by mouth once a day   Problem # 3:  ANXIETY (ICD-300.00) Assessment: Improved  Her updated medication list for this problem includes:    Lexapro 10 Mg Tabs (Escitalopram oxalate) .Marland Kitchen... Take 1 tablet by mouth once a day   Complete Medication List: 1)  Lexapro 10 Mg Tabs (Escitalopram oxalate) .... Take 1 tablet by mouth once a day 2)  Prilosec 40 Mg Cpdr (Omeprazole) .... Once daily 3)  Metronidazole 500 Mg Tabs (Metronidazole) ....  Take 1 tablet by mouth two times a day 4)  Trileptal 300 Mg Tabs (Oxcarbazepine) .... Take one tab four times a day 5)  Premarin 0.625 Mg/gm Crea (Estrogens, conjugated) .... Appply weekly  Other Orders: EKG w/ Interpretation (93000) Tdap => 66yrs IM ([address]) Admin 1st Vaccine ([address])   Patient Instructions: 1)  Please schedule a follow-up appointment in 1 year. 2)  It is important that you exercise regularly at  least 20 minutes 5 times a week. If you develop chest pain, have severe difficulty breathing, or feel very tired , stop exercising immediately and seek medical attention. 3)  Schedule your mammogram. 4)  Schedule a colonoscopy/sigmoidoscopy to help detect colon cancer. 5)  Take calcium +Vitamin D daily. 6)  Take an Aspirin every day. 7)  apply Premarin vaginal cream weekly, do   not use the applicator   Prescriptions: TRILEPTAL 300 MG TABS (OXCARBAZEPINE) take one tab four times a day  #120 x 5   Entered and Authorized by:   Roderick Pee MD   Signed by:   Roderick Pee MD on 10/11/2008   Method used:   Print then Give to Patient   RxID:   [id number] PRILOSEC 40 MG  CPDR (OMEPRAZOLE) once daily  #100 x 3   Entered and Authorized by:   Roderick Pee MD   Signed by:   Roderick Pee MD on 10/11/2008   Method used:   Print then Give to Patient   RxID:   [id number] LEXAPRO 10 MG TABS (ESCITALOPRAM OXALATE) Take 1 tablet by mouth once a day  #100 x 3   Entered and Authorized by:   Roderick Pee MD   Signed by:   Roderick Pee MD on 10/11/2008   Method used:   Print then Give to Patient   RxID:   [id number] PREMARIN 0.625 MG/GM CREA (ESTROGENS, CONJUGATED) appply weekly  #3 tubes x 3   Entered and Authorized by:   Roderick Pee MD   Signed by:   Roderick Pee MD on 10/11/2008   Method used:   Print then Give to Patient   RxID:   [id number]    Tetanus/Td Vaccine    Vaccine Type: Tdap    Site: left deltoid    Mfr: [employer]oSmith[employer]    Dose:  0.5 ml    Route: IM    Given by: Kern Reap CMA    Exp. Date: 10/09/2010    Lot #: [id number]

## 2010-09-14 NOTE — Progress Notes (Addendum)
Summary: refills  Phone Note Refill Request   Refills Requested: Medication #1:  TRILEPTAL 300 MG TABS take one tab four times a day  Medication #2:  PRILOSEC 40 MG  CPDR once daily Initial call taken by: Kern Reap CMA Duncan Dull),  November 11, 2009 5:27 PM    Prescriptions: PRILOSEC 40 MG  CPDR (OMEPRAZOLE) once daily  #30 x 0   Entered by:   Kern Reap CMA (AAMA)   Authorized by:   Roderick Pee MD   Signed by:   Kern Reap CMA (AAMA) on 11/11/2009   Method used:   Faxed to ...       [facility]       [address]       [place], [place]  [address]       Ph: [phone] or [phone]       Fax: [phone]   RxID:   [id number] TRILEPTAL 300 MG TABS (OXCARBAZEPINE) take one tab four times a day  #120 x 0   Entered by:   Kern Reap CMA (AAMA)   Authorized by:   Roderick Pee MD   Signed by:   Kern Reap CMA (AAMA) on 11/11/2009   Method used:   Faxed to ...       Hospital doctor (retail)       [address]       [place], [place]  [address]       Ph: [phone] or [phone]       Fax: [phone]   RxID:   [id number]

## 2010-09-14 NOTE — Progress Notes (Addendum)
Summary: return call  Phone Note Call from Patient   Summary of Call: patient returning our call cell number [phone] Initial call taken by: Kern Reap CMA Duncan Dull),  Dec 17, 2009 10:03 AM  Follow-up for Phone Call        Phone Call Completed Follow-up by: Roderick Pee MD,  Dec 17, 2009 10:10 AM

## 2010-09-14 NOTE — Assessment & Plan Note (Addendum)
Summary: abdominal pain/dm   Vital Signs:  Patient Profile:   60 Years Old Female Height:     62.5 inches (158.75 cm) Weight:      183 pounds Temp:     98.8 degrees F oral Pulse rate:   68 / minute Pulse rhythm:   regular BP sitting:   138 / 84  (left arm) Cuff size:   regular  Vitals Entered By: Kern Reap CMA (July 16, 2008 3:13 PM)                 Chief Complaint:  diverticulitis.  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 Four.  [facility] who comes in today for evaluation of left lower quadrant abdominal pain.  She said a history of sigmoid diverticulitis.  Last week she developed some constipation and on Saturday began to have left lower quadrant abdominal pain.  If same type of pain but when her diverticulitis flares up.  She's had no fever, chills, nausea, vomiting, nor diarrhea.  She has side effects, and the Flagyl was a bad metallic taste    Current Allergies: No known allergies   Past Medical History:    Reviewed history from 05/24/2007 and no changes required:       Anxiety       Depression       Diverticulosis, colon       Gerd       status post TAH, BSO,        status post gallbladder removal       history of trigeminal neuralgia   Social History:    Reviewed history from 05/24/2007 and no changes required:       Occupation: Press photographer div.       Married       Never Smoked       Alcohol use-no       Drug use-no       Regular exercise-no    Review of Systems      See HPI   Physical Exam  General:     Well-developed,well-nourished,in no acute distress; alert,appropriate and cooperative throughout examination Abdomen:     abdomen appears normal.  Bowel sounds normal tenderness left lower quadrant without rebound    Impression & Recommendations:  Problem # 1:  DIVERTICULOSIS, COLON (ICD-562.10) Assessment: Deteriorated  Complete Medication List: 1)  Lexapro 10  Mg Tabs (Escitalopram oxalate) .... Take 1 tablet by mouth once a day 2)  Trileptal 150 Mg Tabs (Oxcarbazepine) .... Two times a day 3)  Prilosec 40 Mg Cpdr (Omeprazole) .... Once daily 4)  Ciprofloxacin Hcl 500 Mg Tabs (Ciprofloxacin hcl) .... Take 1 tablet by mouth two times a day 5)  Metronidazole 500 Mg Tabs (Metronidazole) .... Take 1 tablet by mouth two times a day   Patient Instructions: 1)  stay on a clear liquid diet until your pain free.  Also take a stool softener two to 3 times a day clean, your colon out.  Begin Cipro 500 mg twice a day for two weeks.  If you begin  running fever or your pain gets worse at Flagyl 500 mg b.i.d.   Prescriptions: METRONIDAZOLE 500 MG TABS (METRONIDAZOLE) Take 1 tablet by mouth two times a day  #30 x 1   Entered and Authorized by:   Roderick Pee MD   Signed by:   Roderick Pee MD on 07/16/2008   Method used:  Print then Give to Patient   RxID:   [id number] CIPROFLOXACIN HCL 500 MG TABS (CIPROFLOXACIN HCL) Take 1 tablet by mouth two times a day  #30 x 1   Entered and Authorized by:   Roderick Pee MD   Signed by:   Roderick Pee MD on 07/16/2008   Method used:   Print then Give to Patient   RxID:   [id number]  ]

## 2010-09-14 NOTE — Assessment & Plan Note (Addendum)
Summary: F/U ON DIVERTICULITIS FLARE            Krystal Monroe    History of Present Illness Visit Type: new patient  Primary GI MD: Lina Sar MD Primary Provider: Kelle Darting, MD  Requesting Provider: na Chief Complaint: Diverticulitis flare, acid reflux, heartburn, and hemorrhoids History of Present Illness:   This is a 60 year old white female currently much improved after 10 days of Flagyl and Cipro. Her first episode occurred in the 1990s and required hospitalization ( intravenous antibiotics). Her second  espisode occurred in 2005. She presented with lower abdominal pain and a CT scan showed a thickened inflamed sigmoid colon consistent with diverticulitis. The third episode occurred last year and lasted only 4 days and patient responded to oral antibiotics that she had at home. She never went to the doctor. The last episode occurred 2 weeks ago and started rather suddenly with left lower quadrant abdominal pain. She was immediately started on Flagyl 500 mg twice a day and Cipro 500 mg twice a day for 10 days with almost complete resolution of her pain. Her last colonoscopy in February 2002 and prior to that in 1996 showed moderately severe diverticulosis and a 9 mm sessile polyp which was removed but not retrieved.   GI Review of Systems      Denies abdominal pain, acid reflux, belching, bloating, chest pain, dysphagia with liquids, dysphagia with solids, heartburn, loss of appetite, nausea, vomiting, vomiting blood, weight loss, and  weight gain.      Reports diverticulosis and  hemorrhoids.     Denies anal fissure, black tarry stools, change in bowel habit, constipation, diarrhea, fecal incontinence, heme positive stool, irritable bowel syndrome, jaundice, light color stool, liver problems, rectal bleeding, and  rectal pain.    Current Medications (verified): 1)  Lexapro 10 Mg Tabs (Escitalopram Oxalate) .... Take 1 Tablet By Mouth Once A Day 2)  Prilosec 40 Mg  Cpdr (Omeprazole) ....  Once Daily 3)  Metronidazole 500 Mg Tabs (Metronidazole) .... Take 1 Tablet By Mouth Two Times A Day 4)  Trileptal 300 Mg Tabs (Oxcarbazepine) .... Take One Tab Four Times A Day 5)  Premarin 0.625 Mg/gm Crea (Estrogens, Conjugated) .... Appply Weekly 6)  Ciprofloxacin Hcl 500 Mg Tabs (Ciprofloxacin Hcl) .... Take 1 Tablet By Mouth Two Times A Day 7)  Fish Oil 1000 Mg Caps (Omega-3 Fatty Acids) .... One Capsule By Mouth Two Times A Day 8)  Vitamin D3 1000 Unit Tabs (Cholecalciferol) .... One Tablet By Mouth Two Times A Day  Allergies (verified): No Known Drug Allergies  Past History:  Past Medical History: Reviewed history from 12/03/2009 and no changes required. Anxiety Depression Diverticulosis, colon Gerd status post TAH, BSO,  status post gallbladder removal history of trigeminal neuralgia hx basal cell - nose  Past Surgical History: Reviewed history from 12/19/2009 and no changes required. Cholecystectomy Hysterectomy Oophorectomy C-Section x 3  Family History: Family History of Anxiety Family History Depression Family History of Diabetes: Grandmother Family History of Crohn's: Father Family History of Breast Cancer:Mother   Social History: Reviewed history from 12/19/2009 and no changes required. 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 Married Never Smoked Alcohol use-no Drug use-no Regular exercise-no  Review of Systems  The patient denies allergy/sinus, anemia, anxiety-new, arthritis/joint pain, back pain, blood in urine, breast changes/lumps, change in vision, confusion, cough, coughing up blood, depression-new, fainting, fatigue, fever, headaches-new, hearing problems, heart murmur, heart rhythm changes, itching, menstrual pain, muscle pains/cramps, night sweats, nosebleeds, pregnancy symptoms, shortness  of breath, skin rash, sleeping problems, sore throat, swelling of feet/legs, swollen lymph glands, thirst - excessive , urination - excessive , urination changes/pain,  urine leakage, vision changes, and voice change.         Pertinent positive and negative review of systems were noted in the above HPI. All other ROS was otherwise negative.   Vital Signs:  Patient profile:   60 year old female Menstrual status:  hysterectomy Height:      63 inches Weight:      180 pounds BMI:     32.00 BSA:     1.85 Pulse rate:   68 / minute Pulse rhythm:   regular BP sitting:   116 / 64  (left arm) Cuff size:   regular  Vitals Entered By: Ok Anis CMA (Dec 25, 2009 4:22 PM)  Physical Exam  General:  Well developed, well nourished, no acute distress. Mouth:  No deformity or lesions, dentition normal. Neck:  Supple; no masses or thyromegaly. Lungs:  Clear throughout to auscultation. Heart:  Regular rate and rhythm; no murmurs, rubs,  or bruits. Abdomen:  relaxed abdomen with normoactive bowel sounds and very mild tenderness on deep pressure in the left lower quadrant. No mass or rebound. Right lower and upper quadrants were unremarkable. Status post a laparoscopic cholecystectomy scar in right upper quadrant. Rectal:  normal rectal tone. stool is soft Hemoccult-negative. Extremities:  No clubbing, cyanosis, edema or deformities noted. Skin:  Intact without significant lesions or rashes. Psych:  Alert and cooperative. Normal mood and affect.   Impression & Recommendations:  Problem # 1:  DIVERTICULITIS, COLON, NOS (ICD-562.11) This is the patient's fourth episode of diverticulitis in the last 15 years. It has now resolved on antibiotics but there is some residual tenderness in the left lower quadrant. I would prefer for her to take one more week of antibiotics to make sure that she does not have any residual focus of infection. If the pain or tenderness continues, we will have to proceed with a CT scan of the abdomen. She is due for a colonoscopy but I would prefer to wait till the acute attack resollves. We discussed the possibility of a sigmoid resection.  However, she would prefer to wait since she has had only 3-4 attacks in the last 15 years. We have discussed possible complications  if she has recurrent episode  complicated by an abcess or perforation necessitating a diverting colostomy and subsequent takedown of colostomy  All her attacks have been well managed using antibiotics without delay and she complied with bowel rest ,so  it is of acceptable for her to hold off on a surgical resection. She will resume Metamucil 1 heaping teaspoon daily.  Problem # 2:  COLONIC POLYPS, HX OF (ICD-V12.72) Patient had adenomatous polyps of the colon in 2002. She will be scheduled for a colonoscopy within the next 3 months after complete resolution of her acute episode.  Patient Instructions: 1)  Cipro 250 mg by mouth two times a day x 7 days. 2)  Flagyl 250 mg by mouth three times a day x 7 days. 3)  Please pick up your prescriptions at [facility] pharmacy. 4)  colonoscopy for followup of adenomatous polyp at the end of July or August 2011 5)  Copy sent to : Alonza Smoker, MD 6)  The medication list was reviewed and reconciled.  All changed / newly prescribed medications were explained.  A complete medication list was provided to the patient / caregiver.  Prescriptions: FLAGYL 250 MG TABS (METRONIDAZOLE) Take 1 tablet by mouth three times a day x 7 days  #21 x 0   Entered by:   Lamona Curl CMA (AAMA)   Authorized by:   Hart Carwin MD   Signed by:   Lamona Curl CMA (AAMA) on 12/25/2009   Method used:   Electronically to        [facility]* (retail)       [address]       [address] Shipping/mailing       [place], [place]  [address]       Ph: [phone]       Fax: [phone]   RxID:   [id number] CIPRO 250 MG TABS (CIPROFLOXACIN HCL) Take 1 tablet by mouth two times a day x 7 days  #14 x 0   Entered by:   Lamona Curl CMA (AAMA)   Authorized by:   Hart Carwin MD   Signed by:   Lamona Curl CMA (AAMA) on 12/25/2009   Method used:   Electronically to        [facility]* (retail)       [address]       [address] Shipping/mailing       [place], [place]  [address]       Ph: [phone]       Fax: [phone]   RxID:   [id number]

## 2010-09-14 NOTE — Assessment & Plan Note (Addendum)
Summary: DIVERTICULITIS//CCM   Vital Signs:  Patient profile:   60 year old female Menstrual status:  hysterectomy Temp:     98.2 degrees F oral BP sitting:   118 / 78  (left arm) Cuff size:   regular  Vitals Entered By: Kern Reap CMA Duncan Dull) (Dec 15, 2009 12:05 PM) CC: diverticulitis   CC:  diverticulitis.  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  She said a history of severe diverticulitis.  She's had recurrent episodes and she knows if she gets off schedule with her stool softeners.  That will trigger a bout of diverticulitis.  She draws scheduled last week and began having left lower quadrant abdominal pain.  Yesterday.  She says on a scale of one to 10 yesterday was a 10 days decrease to about a 5.  She states it does not hurt unless she moves.  She's had no fever, chills, nausea or vomiting, or diarrhea.  No urinary tract symptoms.  She took some Cipro and Flagyl that she had left over from a previous episode of diverticulitis and feels somewhat better today   Allergies: No Known Drug Allergies  Past History:  Past medical, surgical, family and social histories (including risk factors) reviewed for relevance to current acute and chronic problems.  Past Medical History: Reviewed history from 12/03/2009 and no changes required. Anxiety Depression Diverticulosis, colon Gerd status post TAH, BSO,  status post gallbladder removal history of trigeminal neuralgia hx basal cell - nose  Past Surgical History: Reviewed history from 10/11/2008 and no changes required. Cholecystectomy Hysterectomy Oophorectomy  Family History: Reviewed history from 10/11/2008 and no changes required. Family History of Anxiety Family History Depression  Social History: Reviewed history from 05/24/2007 and no changes required. Occupation: Press photographer  div. Married Never Smoked Alcohol use-no Drug use-no Regular exercise-no  Review of Systems      See HPI  Physical Exam  General:  Well-developed,well-nourished,in no acute distress; alert,appropriate and cooperative throughout examination Abdomen:  the abdomen is soft, but somewhat bloated.  The bowel sounds are normal.  Tenderness left lower quadrant with no rebound   Problems:  Medical Problems Added: 1)  Dx of Diverticulitis, Colon, Nos  (ICD-562.11)  Impression & Recommendations:  Problem # 1:  DIVERTICULITIS, COLON, NOS (ICD-562.11) Assessment Deteriorated  Complete Medication List: 1)  Lexapro 10 Mg Tabs (Escitalopram oxalate) .... Take 1 tablet by mouth once a day 2)  Prilosec 40 Mg Cpdr (Omeprazole) .... Once daily 3)  Metronidazole 500 Mg Tabs (Metronidazole) .... Take 1 tablet by mouth two times a day 4)  Trileptal 300 Mg Tabs (Oxcarbazepine) .... Take one tab four times a day 5)  Premarin 0.625 Mg/gm Crea (Estrogens, conjugated) .... Appply weekly 6)  Ciprofloxacin Hcl 500 Mg Tabs (Ciprofloxacin hcl) .... Take 1 tablet by mouth two times a day  Patient Instructions: 1)  clear liquid diet,  milk of mag prune juice Metamucil, etc., to clean your colon out.  Flagyl and Cipro one of each twice daily.  Rest at home.  Return Wednesday morning 815 Prescriptions: METRONIDAZOLE 500 MG TABS (METRONIDAZOLE) Take 1 tablet by mouth two times a day  #30 x 1   Entered and Authorized by:   Roderick Pee MD   Signed by:   Roderick Pee MD on 12/15/2009   Method used:   Electronically to  [facility]* (retail)       [address]       [address] Shipping/mailing       [place], [place]  [address]       Ph: [phone]       Fax: [phone]   RxID:   [id number] CIPROFLOXACIN HCL 500 MG TABS (CIPROFLOXACIN HCL) Take 1 tablet by mouth two times a day  #30 x 1   Entered and Authorized by:   Roderick Pee MD   Signed by:   Roderick Pee MD  on 12/15/2009   Method used:   Electronically to        [facility]* (retail)       [address]       [address] Shipping/mailing       [place], [place]  [address]       Ph: [phone]       Fax: [phone]   RxID:   [id number]

## 2010-09-14 NOTE — Assessment & Plan Note (Addendum)
Summary: jaw pain/njr   Vital Signs:  Patient Profile:   60 Years Old Female Height:     62.5 inches (158.75 cm) Temp:     98.3 degrees F oral Pulse rate:   68 / minute Pulse rhythm:   regular BP sitting:   120 / 84  (left arm) Cuff size:   regular  Vitals Entered By: Kern Reap CMA (August 19, 2008 11:59 AM)                 Chief Complaint:  jaw pain.  History of Present Illness: Krystal Monroe is a 60 year old female, who comes in today with worsening trigeminal neuralgia.  This was diagnosed 3 years ago.  Since that time.  She has been seen and treated by Dr. Rosanne Ashing love her neurologist.  She is currently on Trileptal 300 mg t.i.d. however, today, he increased it to 600 mg t.i.d. because since New Year's Eve.  She's had a flareup in her pain.  She says her pain on a scale of one to 10 is at 20.  She would also like to discuss other options besides continuing the medication.    Updated Prior Medication List: LEXAPRO 10 MG TABS (ESCITALOPRAM OXALATE) Take 1 tablet by mouth once a day PRILOSEC 40 MG  CPDR (OMEPRAZOLE) once daily CIPROFLOXACIN HCL 500 MG TABS (CIPROFLOXACIN HCL) Take 1 tablet by mouth two times a day METRONIDAZOLE 500 MG TABS (METRONIDAZOLE) Take 1 tablet by mouth two times a day TRILEPTAL 600 MG TABS (OXCARBAZEPINE) take one tablet three times a day  Current Allergies: No known allergies   Past Medical History:    Reviewed history from 05/24/2007 and no changes required:       Anxiety       Depression       Diverticulosis, colon       Gerd       status post TAH, BSO,        status post gallbladder removal       history of trigeminal neuralgia   Social History:    Reviewed history from 05/24/2007 and no changes required:       Occupation: Press photographer div.       Married       Never Smoked       Alcohol use-no       Drug use-no       Regular exercise-no    Review of Systems      See HPI   Physical Exam  General:  Well-developed,well-nourished,in no acute distress; alert,appropriate and cooperative throughout examination Neurologic:     No cranial nerve deficits noted. Station and gait are normal. Plantar reflexes are down-going bilaterally. DTRs are symmetrical throughout. Sensory, motor and coordinative functions appear intact.    Impression & Recommendations:  Problem # 1:  NEURALGIA, TRIGEMINAL (ICD-350.1) Assessment: Deteriorated  Complete Medication List: 1)  Lexapro 10 Mg Tabs (Escitalopram oxalate) .... Take 1 tablet by mouth once a day 2)  Prilosec 40 Mg Cpdr (Omeprazole) .... Once daily 3)  Ciprofloxacin Hcl 500 Mg Tabs (Ciprofloxacin hcl) .... Take 1 tablet by mouth two times a day 4)  Metronidazole 500 Mg Tabs (Metronidazole) .... Take 1 tablet by mouth two times a day 5)  Trileptal 600 Mg Tabs (Oxcarbazepine) .... Take one tablet three times a day 6)  Dilaudid 4 Mg Tabs (Hydromorphone hcl) .... One,q 4 h. as needed pain   Patient Instructions: 1)  continue the Trileptal at 600 mg 3 times  a day, and the pain pills, one every 4 to 6 hours as needed for pain.  Also called Dr. love to be seen sometime in the next two weeks to discuss other options   Prescriptions: DILAUDID 4 MG TABS (HYDROMORPHONE HCL) one,q 4 h. as needed pain  #50 x 0   Entered and Authorized by:   Roderick Pee MD   Signed by:   Roderick Pee MD on 08/19/2008   Method used:   Print then Give to Patient   RxID:   [id number]  ]

## 2010-09-17 NOTE — Letter (Signed)
Summary: Patient Notice- Polyp Results  [facility]  [address] [place], [place] [address]   Phone: [phone]  Fax: [phone]        July 29, 2010 MRN: [id number]    Krystal Monroe [address] [place], [place]  [address]    Dear Ms. Zehren,  I am pleased to inform you that the colon polyp(s) removed during your recent colonoscopy was (were) found to be benign (no cancer detected) upon pathologic examination.The polyps were adenomatous ( precancerous) as well as hyperplastic ( not precancerous)  I recommend you have a repeat colonoscopy examination in 5_ years to look for recurrent polyps, as having colon polyps increases your risk for having recurrent polyps or even colon cancer in the future.  Should you develop new or worsening symptoms of abdominal pain, bowel habit changes or bleeding from the rectum or bowels, please schedule an evaluation with either your primary care physician or with me.  Additional information/recommendations:  _x_ No further action with gastroenterology is needed at this time. Please      follow-up with your primary care physician for your other healthcare      needs.  __ Please call [phone] to schedule a return visit to review your      situation.  __ Please keep your follow-up visit as already scheduled.  __ Continue treatment plan as outlined the day of your exam.  Please call us if you are having persistent problems or have questions about your condition that have not been fully answered at this time.  Sincerely,  Hart Carwin MD  This letter has been electronically signed by your physician.  Appended Document: Patient Notice- Polyp Results Letter Mailed

## 2010-09-17 NOTE — Procedures (Signed)
Summary: Colonoscopy  Patient: Krystal Monroe Note: All result statuses are Final unless otherwise noted.  Tests: (1) Colonoscopy (COL)   COL Colonoscopy           DONE     [facility]     [address]     [place], [place]  [address]           COLONOSCOPY PROCEDURE REPORT           PATIENT:  Krystal, Monroe  MR#:  [id number]     BIRTHDATE:  [birth year], 58 yrs. old  GENDER:  female     ENDOSCOPIST:  Hedwig Morton. Juanda Chance, MD     REF. BY:     PROCEDURE DATE:  07/28/2010     PROCEDURE:  Colonoscopy 86578     ASA CLASS:  Class I     INDICATIONS:  history of polyps normal colon 1996     colon polyp 2002 not retrieved     MEDICATIONS:   Versed 12 mg, Fentanyl 125 mcg           DESCRIPTION OF PROCEDURE:   After the risks benefits and     alternatives of the procedure were thoroughly explained, informed     consent was obtained.  Digital rectal exam was performed and     revealed no rectal masses.   The LB PCF-H180AL [id number] endoscope     was introduced through the anus and advanced to the cecum, which     was identified by both the appendix and ileocecal valve, without     limitations.  The quality of the prep was excellent, using     MiraLax.  The instrument was then slowly withdrawn as the colon     was fully examined.     <<PROCEDUREIMAGES>>           FINDINGS:  Three polyps were found. 2-3 mm polyp at 50 cm and 2     polyps in the cecum The polyps were removed using cold biopsy     forceps (see image1, image2, image6, and image5).  Mild     diverticulosis was found in the sigmoid colon (see image7).     Otherwise normal colonoscopy without other polyps, masses, vascular     ectasias, or inflammatory changes (see image8, image4, and     image5).   Retroflexed views in the rectum revealed no     abnormalities.    The scope was then withdrawn from the patient     and the procedure completed.           COMPLICATIONS:  None     ENDOSCOPIC IMPRESSION:     1) Three polyps     2)  Mild diverticulosis in the sigmoid colon     3) Otherwise nl colonoscopy WMO     RECOMMENDATIONS:     1) Await pathology results     2) High fiber diet.     REPEAT EXAM:  In 10 year(s) for.           ______________________________     Hedwig Morton. Juanda Chance, MD           CC:           n.     eSIGNED:   Hedwig Morton. Brodie at 07/28/2010 11:10 AM           Krystal Monroe, Krystal Monroe, [id number]  Note: An exclamation mark (!) indicates a result that was not dispersed into the flowsheet. Document  Creation Date: 07/28/2010 11:10 AM _______________________________________________________________________  (1) Order result status: Final Collection or observation date-time: 07/28/2010 11:00 Requested date-time:  Receipt date-time:  Reported date-time:  Referring Physician:   Ordering Physician: Lina Sar [phone]) Specimen Source:  Source: Launa Grill Order Number: [id number] Lab site:   Appended Document: Colonoscopy     Procedures Next Due Date:    Colonoscopy: 07/2015

## 2011-02-18 ENCOUNTER — Other Ambulatory Visit (INDEPENDENT_AMBULATORY_CARE_PROVIDER_SITE_OTHER): Payer: 59

## 2011-02-18 DIAGNOSIS — Z0000 Encounter for general adult medical examination without abnormal findings: Secondary | ICD-10-CM

## 2011-02-18 LAB — LIPID PANEL
Cholesterol: 192 mg/dL (ref 0–200)
HDL: 41.2 mg/dL (ref >39.00)
Total CHOL/HDL Ratio: 5
Triglycerides: 287 mg/dL — ABNORMAL HIGH (ref 0.0–149.0)
VLDL: 57.4 mg/dL — ABNORMAL HIGH (ref 0.0–40.0)

## 2011-02-18 LAB — BASIC METABOLIC PANEL
BUN: 14 mg/dL (ref 6–23)
CO2: 27 mEq/L (ref 19–32)
Calcium: 9.2 mg/dL (ref 8.4–10.5)
Chloride: 100 mEq/L (ref 96–112)
Creatinine, Ser: 0.6 mg/dL (ref 0.4–1.2)
GFR: 100.08 mL/min (ref >60.00)
Glucose, Bld: 107 mg/dL — ABNORMAL HIGH (ref 70–99)
Potassium: 4.5 mEq/L (ref 3.5–5.1)
Sodium: 135 mEq/L (ref 135–145)

## 2011-02-18 LAB — CBC WITH DIFFERENTIAL/PLATELET
Basophils Absolute: 0 10*3/uL (ref 0.0–0.1)
Basophils Relative: 0.5 % (ref 0.0–3.0)
Eosinophils Absolute: 0.1 10*3/uL (ref 0.0–0.7)
Eosinophils Relative: 1.8 % (ref 0.0–5.0)
HCT: 38.5 % (ref 36.0–46.0)
Hemoglobin: 13.2 g/dL (ref 12.0–15.0)
Lymphocytes Relative: 26.2 % (ref 12.0–46.0)
Lymphs Abs: 1.4 10*3/uL (ref 0.7–4.0)
MCHC: 34.3 g/dL (ref 30.0–36.0)
MCV: 85.7 fl (ref 78.0–100.0)
Monocytes Absolute: 0.3 10*3/uL (ref 0.1–1.0)
Monocytes Relative: 6 % (ref 3.0–12.0)
Neutro Abs: 3.5 10*3/uL (ref 1.4–7.7)
Neutrophils Relative %: 65.5 % (ref 43.0–77.0)
Platelets: 204 10*3/uL (ref 150.0–400.0)
RBC: 4.49 Mil/uL (ref 3.87–5.11)
RDW: 13.9 % (ref 11.5–14.6)
WBC: 5.3 10*3/uL (ref 4.5–10.5)

## 2011-02-18 LAB — POCT URINALYSIS DIPSTICK
Bilirubin, UA: NEGATIVE
Blood, UA: NEGATIVE
Glucose, UA: NEGATIVE
Ketones, UA: NEGATIVE
Nitrite, UA: NEGATIVE
Protein, UA: NEGATIVE
Spec Grav, UA: 1.02
Urobilinogen, UA: 0.2
pH, UA: 5

## 2011-02-18 LAB — HEPATIC FUNCTION PANEL
ALT: 25 U/L (ref 0–35)
AST: 23 U/L (ref 0–37)
Albumin: 4.6 g/dL (ref 3.5–5.2)
Alkaline Phosphatase: 56 U/L (ref 39–117)
Bilirubin, Direct: 0.1 mg/dL (ref 0.0–0.3)
Total Bilirubin: 0.6 mg/dL (ref 0.3–1.2)
Total Protein: 6.9 g/dL (ref 6.0–8.3)

## 2011-02-18 LAB — TSH: TSH: 1.19 u[IU]/mL (ref 0.35–5.50)

## 2011-02-18 LAB — LDL CHOLESTEROL, DIRECT: Direct LDL: 120.1 mg/dL

## 2011-02-25 ENCOUNTER — Encounter: Payer: Self-pay | Admitting: Family Medicine

## 2011-02-25 ENCOUNTER — Ambulatory Visit (INDEPENDENT_AMBULATORY_CARE_PROVIDER_SITE_OTHER): Payer: 59 | Admitting: Family Medicine

## 2011-02-25 DIAGNOSIS — G500 Trigeminal neuralgia: Secondary | ICD-10-CM

## 2011-02-25 DIAGNOSIS — F3289 Other specified depressive episodes: Secondary | ICD-10-CM

## 2011-02-25 DIAGNOSIS — Z2911 Encounter for prophylactic immunotherapy for respiratory syncytial virus (RSV): Secondary | ICD-10-CM

## 2011-02-25 DIAGNOSIS — F329 Major depressive disorder, single episode, unspecified: Secondary | ICD-10-CM

## 2011-02-25 DIAGNOSIS — Z23 Encounter for immunization: Secondary | ICD-10-CM

## 2011-02-25 DIAGNOSIS — R635 Abnormal weight gain: Secondary | ICD-10-CM

## 2011-02-25 DIAGNOSIS — Z0000 Encounter for general adult medical examination without abnormal findings: Secondary | ICD-10-CM

## 2011-02-25 DIAGNOSIS — K219 Gastro-esophageal reflux disease without esophagitis: Secondary | ICD-10-CM

## 2011-02-25 MED ORDER — METHYLPHENIDATE HCL 10 MG PO TABS: ORAL_TABLET | ORAL | Status: AC

## 2011-02-25 MED ORDER — ESCITALOPRAM OXALATE 10 MG PO TABS: 10.0000 mg | ORAL_TABLET | Freq: Every day | ORAL | Status: DC

## 2011-02-25 MED ORDER — OMEPRAZOLE 40 MG PO CPDR: 40.0000 mg | DELAYED_RELEASE_CAPSULE | Freq: Every day | ORAL | Status: DC

## 2011-02-25 NOTE — Progress Notes (Signed)
  Subjective:    Patient ID: Krystal Monroe, female    DOB: 1951/04/03, 60 y.o.   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  Doing well.  She takes Prilosec 40 mg daily for reflux.  She takes calcium and vitamin D for bone health.  She has a history of a basal cell carcinoma on her nose.  She had Mohs surgery.  She was a life guard as a teenager.  She had her tonight care, dental care, BSE monthly, and you mammography, colonoscopy, 2012 normal, tetanus, 2010, information given on shingles.  Her weight is 179, she would like to lose some weight    Review of Systems     Objective:   Physical Exam  Constitutional: She appears well-developed and well-nourished.  HENT:  Head: Normocephalic and atraumatic.  Right Ear: External ear normal.  Left Ear: External ear normal.  Nose: Nose normal.  Mouth/Throat: Oropharynx is clear and moist.  Eyes: EOM are normal. Pupils are equal, round, and reactive to light.  Neck: Normal range of motion. Neck supple. No thyromegaly present.  Cardiovascular: Normal rate, regular rhythm, normal heart sounds and intact distal pulses.  Exam reveals no gallop and no friction rub.   No murmur heard. Pulmonary/Chest: Effort normal and breath sounds normal.  Abdominal: Soft. Bowel sounds are normal. She exhibits no distension and no mass. There is no tenderness. There is no rebound.  Genitourinary: Vagina normal. Guaiac negative stool. No vaginal discharge found.  Musculoskeletal: Normal range of motion.  Lymphadenopathy:    She has no cervical adenopathy.  Neurological: She is alert. She has normal reflexes. No cranial nerve deficit. She exhibits normal muscle tone. Coordination normal.  Skin: Skin is warm and dry.       Total body skin exam normal except for a scar in her  nose from previous Mohs surgery.  Psychiatric: She has a normal mood and affect. Her behavior is normal. Judgment and thought content normal.          Assessment & Plan:  Healthy female.  Mild depression.  Continue Lexapro 10 mg a day at bedtime.  Reflux esophagitis.  Continue Prilosec 40 daily  Trigeminal neuralgia, resolved.  Obesity,,,,,,,, diet exercise ,,,,,,,,,,,,,,Off label, Ritalin 10 mg daily x 1 month

## 2011-02-25 NOTE — Patient Instructions (Signed)
Continue your current medications.  Let's begin a diet program along with 15 minutes of walking daily along with 10 mg of Ritalin in the morning for two months.  Return one year or sooner if any problems.  Set up a 30 minute appointment sometime in the next month for removal of the skin tags

## 2011-04-08 ENCOUNTER — Ambulatory Visit: Payer: 59 | Admitting: Family Medicine

## 2012-03-17 ENCOUNTER — Other Ambulatory Visit: Payer: Self-pay | Admitting: *Deleted

## 2012-03-17 DIAGNOSIS — F329 Major depressive disorder, single episode, unspecified: Secondary | ICD-10-CM

## 2012-03-17 MED ORDER — ESCITALOPRAM OXALATE 10 MG PO TABS: 10.0000 mg | ORAL_TABLET | Freq: Every day | ORAL | Status: DC

## 2012-05-04 ENCOUNTER — Other Ambulatory Visit: Payer: Self-pay | Admitting: Family Medicine

## 2012-05-15 ENCOUNTER — Other Ambulatory Visit (INDEPENDENT_AMBULATORY_CARE_PROVIDER_SITE_OTHER): Payer: 59

## 2012-05-15 DIAGNOSIS — Z0000 Encounter for general adult medical examination without abnormal findings: Secondary | ICD-10-CM

## 2012-05-15 DIAGNOSIS — Z13220 Encounter for screening for lipoid disorders: Secondary | ICD-10-CM

## 2012-05-15 LAB — CBC WITH DIFFERENTIAL/PLATELET
Basophils Absolute: 0 10*3/uL (ref 0.0–0.1)
Basophils Relative: 0.2 % (ref 0.0–3.0)
Eosinophils Absolute: 0.1 10*3/uL (ref 0.0–0.7)
Eosinophils Relative: 1.5 % (ref 0.0–5.0)
HCT: 39.6 % (ref 36.0–46.0)
Hemoglobin: 13.1 g/dL (ref 12.0–15.0)
Lymphocytes Relative: 20.8 % (ref 12.0–46.0)
Lymphs Abs: 1.5 10*3/uL (ref 0.7–4.0)
MCHC: 33 g/dL (ref 30.0–36.0)
MCV: 85.6 fl (ref 78.0–100.0)
Monocytes Absolute: 0.4 10*3/uL (ref 0.1–1.0)
Monocytes Relative: 5.4 % (ref 3.0–12.0)
Neutro Abs: 5.1 10*3/uL (ref 1.4–7.7)
Neutrophils Relative %: 72.1 % (ref 43.0–77.0)
Platelets: 210 10*3/uL (ref 150.0–400.0)
RBC: 4.63 Mil/uL (ref 3.87–5.11)
RDW: 13.6 % (ref 11.5–14.6)
WBC: 7 10*3/uL (ref 4.5–10.5)

## 2012-05-15 LAB — LIPID PANEL
Cholesterol: 203 mg/dL — ABNORMAL HIGH (ref 0–200)
HDL: 37.3 mg/dL — ABNORMAL LOW (ref >39.00)
Total CHOL/HDL Ratio: 5
Triglycerides: 244 mg/dL — ABNORMAL HIGH (ref 0.0–149.0)
VLDL: 48.8 mg/dL — ABNORMAL HIGH (ref 0.0–40.0)

## 2012-05-15 LAB — TSH: TSH: 1.95 u[IU]/mL (ref 0.35–5.50)

## 2012-05-15 LAB — HEPATIC FUNCTION PANEL
ALT: 33 U/L (ref 0–35)
AST: 28 U/L (ref 0–37)
Albumin: 4.3 g/dL (ref 3.5–5.2)
Alkaline Phosphatase: 55 U/L (ref 39–117)
Bilirubin, Direct: 0.1 mg/dL (ref 0.0–0.3)
Total Bilirubin: 0.9 mg/dL (ref 0.3–1.2)
Total Protein: 7.4 g/dL (ref 6.0–8.3)

## 2012-05-15 LAB — BASIC METABOLIC PANEL
BUN: 12 mg/dL (ref 6–23)
CO2: 24 mEq/L (ref 19–32)
Calcium: 9.5 mg/dL (ref 8.4–10.5)
Chloride: 103 mEq/L (ref 96–112)
Creatinine, Ser: 0.7 mg/dL (ref 0.4–1.2)
GFR: 97.91 mL/min (ref >60.00)
Glucose, Bld: 103 mg/dL — ABNORMAL HIGH (ref 70–99)
Potassium: 4 mEq/L (ref 3.5–5.1)
Sodium: 137 mEq/L (ref 135–145)

## 2012-05-15 LAB — POCT URINALYSIS DIPSTICK
Bilirubin, UA: NEGATIVE
Glucose, UA: NEGATIVE
Ketones, UA: NEGATIVE
Nitrite, UA: NEGATIVE
Protein, UA: NEGATIVE
Spec Grav, UA: 1.01
Urobilinogen, UA: 0.2
pH, UA: 5.5

## 2012-05-15 LAB — LDL CHOLESTEROL, DIRECT: Direct LDL: 119.2 mg/dL

## 2012-05-22 ENCOUNTER — Ambulatory Visit (INDEPENDENT_AMBULATORY_CARE_PROVIDER_SITE_OTHER): Payer: 59 | Admitting: Family Medicine

## 2012-05-22 ENCOUNTER — Encounter: Payer: Self-pay | Admitting: Family Medicine

## 2012-05-22 VITALS — BP 132/80 | HR 69 | Temp 98.1°F | Resp 18 | Wt 184.0 lb

## 2012-05-22 DIAGNOSIS — K5730 Diverticulosis of large intestine without perforation or abscess without bleeding: Secondary | ICD-10-CM

## 2012-05-22 DIAGNOSIS — K449 Diaphragmatic hernia without obstruction or gangrene: Secondary | ICD-10-CM

## 2012-05-22 DIAGNOSIS — G4710 Hypersomnia, unspecified: Secondary | ICD-10-CM

## 2012-05-22 DIAGNOSIS — K219 Gastro-esophageal reflux disease without esophagitis: Secondary | ICD-10-CM

## 2012-05-22 DIAGNOSIS — F329 Major depressive disorder, single episode, unspecified: Secondary | ICD-10-CM

## 2012-05-22 DIAGNOSIS — Z0000 Encounter for general adult medical examination without abnormal findings: Secondary | ICD-10-CM

## 2012-05-22 DIAGNOSIS — R635 Abnormal weight gain: Secondary | ICD-10-CM

## 2012-05-22 DIAGNOSIS — G4730 Sleep apnea, unspecified: Secondary | ICD-10-CM

## 2012-05-22 DIAGNOSIS — G500 Trigeminal neuralgia: Secondary | ICD-10-CM

## 2012-05-22 MED ORDER — OMEPRAZOLE 40 MG PO CPDR: 40.0000 mg | DELAYED_RELEASE_CAPSULE | Freq: Every day | ORAL | Status: DC

## 2012-05-22 MED ORDER — HYDROMORPHONE HCL 4 MG PO TABS: 4.0000 mg | ORAL_TABLET | ORAL | Status: DC | PRN

## 2012-05-22 MED ORDER — ESCITALOPRAM OXALATE 10 MG PO TABS: 10.0000 mg | ORAL_TABLET | Freq: Every day | ORAL | Status: DC

## 2012-05-22 NOTE — Progress Notes (Signed)
  Subjective:    Patient ID: Krystal Monroe, female    DOB: Dec 19, 1950, 61 y.o.   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  She states that her husband says that she snores and may stop breathing at night. He's concerned she may have sleep apnea. We will get a consult from Dr. Mellody Dance  She gets routine eye care, dental care referred to Dr. Alvester Morin, BSE monthly at home, and you mammography, colonoscopy, tetanus 2010, shingles 2012, flu shot today.  She also has a history of a basal cell carcinoma on her face    Review of Systems  Constitutional: Negative.   HENT: Negative.   Eyes: Negative.   Respiratory: Negative.   Cardiovascular: Negative.   Gastrointestinal: Negative.   Genitourinary: Negative.   Musculoskeletal: Negative.   Neurological: Negative.   Hematological: Negative.   Psychiatric/Behavioral: Negative.        Objective:   Physical Exam  Constitutional: She appears well-developed and well-nourished.  HENT:  Head: Normocephalic and atraumatic.  Right Ear: External ear normal.  Left Ear: External ear normal.  Nose: Nose normal.  Mouth/Throat: Oropharynx is clear and moist.  Eyes: EOM are normal. Pupils are equal, round, and reactive to light.  Neck: Normal range of motion. Neck supple. No thyromegaly present.  Cardiovascular: Normal rate, regular rhythm, normal heart sounds and intact distal pulses.  Exam reveals no gallop and no friction rub.   No murmur heard. Pulmonary/Chest: Effort normal and breath sounds normal.  Abdominal: Soft. Bowel sounds are normal. She exhibits no distension and no mass. There is no tenderness. There is no rebound.  Genitourinary:  Vagina normal. Guaiac negative stool. No vaginal discharge found.       Bilateral breast exam normal  Musculoskeletal: Normal range of motion.  Lymphadenopathy:    She has no cervical adenopathy.  Neurological: She is alert. She has normal reflexes. No cranial nerve deficit. She exhibits normal muscle tone. Coordination normal.  Skin: Skin is warm and dry.  Psychiatric: She has a normal mood and affect. Her behavior is normal. Judgment and thought content normal.          Assessment & Plan:  Healthy female  History of mild depression continue Lexapro  History of reflux esophagitis with underlying hiatal hernia continue Prilosec 40 mg daily  History of trigeminal neuralgia asymptomatic x2 years  History of stowing plus questionable sleep apnea consult from Dr. Mellody Dance

## 2012-05-22 NOTE — Patient Instructions (Addendum)
Continue your current medications  Try a 45 minute walking program Saturday and Sunday  We will set up a pulmonary consult to evaluate the possibility of sleep apnea  Return in one year sooner if any problems  I also gave you a prescription for Dilaudid just in case the trigeminal neuralgia would recur

## 2012-05-30 ENCOUNTER — Encounter: Payer: Self-pay | Admitting: Family Medicine

## 2012-06-21 ENCOUNTER — Ambulatory Visit (INDEPENDENT_AMBULATORY_CARE_PROVIDER_SITE_OTHER): Payer: 59 | Admitting: Pulmonary Disease

## 2012-06-21 ENCOUNTER — Encounter: Payer: Self-pay | Admitting: Pulmonary Disease

## 2012-06-21 VITALS — BP 128/72 | HR 73 | Temp 98.3°F | Ht 64.0 in | Wt 189.0 lb

## 2012-06-21 DIAGNOSIS — G4710 Hypersomnia, unspecified: Secondary | ICD-10-CM

## 2012-06-21 DIAGNOSIS — G4730 Sleep apnea, unspecified: Secondary | ICD-10-CM

## 2012-06-21 NOTE — Assessment & Plan Note (Signed)
The patient's history is suspicious for clinically significant sleep disordered breathing.  At this point, I think she needs to have a sleep study.  Given her lack of comorbid medical issues, and the fact that she sleeps through the night, I think she would be a good candidate for home sleep testing.  The patient is agreeable to this approach.

## 2012-06-21 NOTE — Patient Instructions (Addendum)
Will set up for home sleep testing.  Will call you with results. Work on weight reduction.

## 2012-06-21 NOTE — Progress Notes (Signed)
  Subjective:    Patient ID: Krystal Monroe, female    DOB: 1950-12-07, 61 y.o.   MRN: [id number]  HPI The patient is a 61 year old female who I've been asked to see for possible obstructive sleep apnea.  She has been noted to have loud snoring, but is unsure if she has had an abnormal breathing pattern during sleep.  She feels that she sleeps well through the night, but is not rested in the mornings upon arising.  She is satisfied with her alertness during the day, but will fall asleep in the evening if she tries to watch television or movies.  She denies any sleepiness with driving.  The patient states that her weight is neutral over the last 2 years, and her Epworth score today is only 3.  Sleep Questionnaire: What time do you typically go to bed?( Between what hours) 2200 How long does it take you to fall asleep? 30 minutes How many times during the night do you wake up? 1 What time do you get out of bed to start your day? 0600 Do you drive or operate heavy machinery in your occupation? No How much has your weight changed (up or down) over the past two years? (In pounds) 4 lb (1.814 kg) Have you ever had a sleep study before? No Do you currently use CPAP? No Do you wear oxygen at any time? No    Review of Systems  Constitutional: Negative for fever and unexpected weight change.  HENT: Positive for postnasal drip. Negative for ear pain, nosebleeds, congestion, sore throat, rhinorrhea, sneezing, trouble swallowing, dental problem and sinus pressure.   Eyes: Negative for redness and itching.  Respiratory: Negative for cough, chest tightness, shortness of breath and wheezing.   Cardiovascular: Negative for palpitations and leg swelling.  Gastrointestinal: Negative for nausea and vomiting.  Genitourinary: Negative for dysuria.  Musculoskeletal: Negative for joint swelling.  Skin: Negative for rash.  Neurological: Negative for headaches.  Hematological: Does not bruise/bleed easily.    Psychiatric/Behavioral: Negative for dysphoric mood. The patient is nervous/anxious.        Objective:   Physical Exam Constitutional:  Overweight female, no acute distress  HENT:  Nares patent without discharge, but narrowed.  Oropharynx without exudate, palate and uvula are moderately elongated.    Eyes:  Perrla, eomi, no scleral icterus  Neck:  No JVD, no TMG  Cardiovascular:  Normal rate, regular rhythm, no rubs or gallops.  No murmurs        Intact distal pulses  Pulmonary :  Normal breath sounds, no stridor or respiratory distress   No rales, rhonchi, or wheezing  Abdominal:  Soft, nondistended, bowel sounds present.  No tenderness noted.   Musculoskeletal:  No lower extremity edema noted.  Lymph Nodes:  No cervical lymphadenopathy noted  Skin:  No cyanosis noted  Neurologic:  Appears mildly sleepy, appropriate, moves all 4 extremities without obvious deficit.         Assessment & Plan:

## 2012-06-30 ENCOUNTER — Telehealth: Payer: Self-pay | Admitting: Pulmonary Disease

## 2012-06-30 ENCOUNTER — Ambulatory Visit (INDEPENDENT_AMBULATORY_CARE_PROVIDER_SITE_OTHER): Payer: 59 | Admitting: Pulmonary Disease

## 2012-06-30 DIAGNOSIS — G4730 Sleep apnea, unspecified: Secondary | ICD-10-CM

## 2012-06-30 DIAGNOSIS — G4733 Obstructive sleep apnea (adult) (pediatric): Secondary | ICD-10-CM

## 2012-06-30 DIAGNOSIS — G4710 Hypersomnia, unspecified: Secondary | ICD-10-CM

## 2012-06-30 NOTE — Telephone Encounter (Signed)
OV sch with KC on Tues., 07/04/12 @ 4:30 to discuss sleep results.

## 2012-06-30 NOTE — Telephone Encounter (Signed)
Pt needs ov to review sleep study 

## 2012-07-04 ENCOUNTER — Encounter: Payer: Self-pay | Admitting: Pulmonary Disease

## 2012-07-04 ENCOUNTER — Ambulatory Visit (INDEPENDENT_AMBULATORY_CARE_PROVIDER_SITE_OTHER): Payer: 59 | Admitting: Pulmonary Disease

## 2012-07-04 VITALS — BP 118/72 | HR 69 | Temp 98.7°F | Ht 64.0 in | Wt 184.0 lb

## 2012-07-04 DIAGNOSIS — G4733 Obstructive sleep apnea (adult) (pediatric): Secondary | ICD-10-CM

## 2012-07-04 NOTE — Assessment & Plan Note (Signed)
The patient has very mild obstructive sleep apnea, but has significant symptoms both at night and during the day.  I have outlined a conservative as well as aggressive approach, with a conservative approach focusing on a trial of weight loss alone.  The more aggressive approach would consist of either a dental appliance or a trial of CPAP.  After a long discussion, the patient would like to take the next 3 months to work on aggressive weight loss, and if unsuccessful, would like to consider a dental appliance.

## 2012-07-04 NOTE — Patient Instructions (Addendum)
Take the next 3 mos to work on weight loss.  Please call us to give update.

## 2012-07-04 NOTE — Progress Notes (Signed)
  Subjective:    Patient ID: Krystal Monroe, female    DOB: 1951/01/22, 61 y.o.   MRN: [id number]  HPI Patient comes in today for followup after her recent sleep study.  She was found to have very mild obstructive sleep apnea with an AHI of 5 events per hour.  I have reviewed this study with her in detail, and answered all of her questions.   Review of Systems  Constitutional: Negative for fever and unexpected weight change.  HENT: Negative for ear pain, nosebleeds, congestion, sore throat, rhinorrhea, sneezing, trouble swallowing, dental problem, postnasal drip and sinus pressure.   Eyes: Negative for redness and itching.  Respiratory: Negative for cough, chest tightness, shortness of breath and wheezing.   Cardiovascular: Negative for palpitations and leg swelling.  Gastrointestinal: Negative for nausea and vomiting.  Genitourinary: Negative for dysuria.  Musculoskeletal: Negative for joint swelling.  Skin: Negative for rash.  Neurological: Negative for headaches.  Hematological: Does not bruise/bleed easily.  Psychiatric/Behavioral: Negative for dysphoric mood. The patient is not nervous/anxious.        Objective:   Physical Exam Overweight female in no acute distress Nose without purulence or discharge noted Neck without lymphadenopathy or thyromegaly Lower extremities without edema, no cyanosis Alert, does not appear to be overly sleepy, moves all 4 extremities.       Assessment & Plan:

## 2012-12-01 ENCOUNTER — Emergency Department (HOSPITAL_COMMUNITY): Payer: 59

## 2012-12-01 ENCOUNTER — Emergency Department (HOSPITAL_COMMUNITY)
Admission: EM | Admit: 2012-12-01 | Discharge: 2012-12-01 | Disposition: A | Discharge status: Home / Self Care | Payer: 59 | Attending: Emergency Medicine | Admitting: Emergency Medicine

## 2012-12-01 DIAGNOSIS — Z8719 Personal history of other diseases of the digestive system: Secondary | ICD-10-CM | POA: Insufficient documentation

## 2012-12-01 DIAGNOSIS — Z85828 Personal history of other malignant neoplasm of skin: Secondary | ICD-10-CM | POA: Insufficient documentation

## 2012-12-01 DIAGNOSIS — Z79899 Other long term (current) drug therapy: Secondary | ICD-10-CM | POA: Insufficient documentation

## 2012-12-01 DIAGNOSIS — F411 Generalized anxiety disorder: Secondary | ICD-10-CM | POA: Insufficient documentation

## 2012-12-01 DIAGNOSIS — Z8669 Personal history of other diseases of the nervous system and sense organs: Secondary | ICD-10-CM | POA: Insufficient documentation

## 2012-12-01 DIAGNOSIS — K5732 Diverticulitis of large intestine without perforation or abscess without bleeding: Secondary | ICD-10-CM | POA: Insufficient documentation

## 2012-12-01 DIAGNOSIS — F3289 Other specified depressive episodes: Secondary | ICD-10-CM | POA: Insufficient documentation

## 2012-12-01 DIAGNOSIS — F329 Major depressive disorder, single episode, unspecified: Secondary | ICD-10-CM | POA: Insufficient documentation

## 2012-12-01 DIAGNOSIS — K5792 Diverticulitis of intestine, part unspecified, without perforation or abscess without bleeding: Secondary | ICD-10-CM

## 2012-12-01 LAB — CBC WITH DIFFERENTIAL/PLATELET
Basophils Absolute: 0 10*3/uL (ref 0.0–0.1)
Basophils Relative: 0 % (ref 0–1)
Eosinophils Absolute: 0.1 10*3/uL (ref 0.0–0.7)
Eosinophils Relative: 0 % (ref 0–5)
HCT: 37.6 % (ref 36.0–46.0)
Hemoglobin: 13.2 g/dL (ref 12.0–15.0)
Lymphocytes Relative: 10 % — ABNORMAL LOW (ref 12–46)
Lymphs Abs: 1.1 10*3/uL (ref 0.7–4.0)
MCH: 28.9 pg (ref 26.0–34.0)
MCHC: 35.1 g/dL (ref 30.0–36.0)
MCV: 82.3 fL (ref 78.0–100.0)
Monocytes Absolute: 0.6 10*3/uL (ref 0.1–1.0)
Monocytes Relative: 5 % (ref 3–12)
Neutro Abs: 9.9 10*3/uL — ABNORMAL HIGH (ref 1.7–7.7)
Neutrophils Relative %: 85 % — ABNORMAL HIGH (ref 43–77)
Platelets: 297 10*3/uL (ref 150–400)
RBC: 4.57 MIL/uL (ref 3.87–5.11)
RDW: 13.5 % (ref 11.5–15.5)
WBC: 11.6 10*3/uL — ABNORMAL HIGH (ref 4.0–10.5)

## 2012-12-01 LAB — LIPASE, BLOOD: Lipase: 28 U/L (ref 11–59)

## 2012-12-01 LAB — URINALYSIS, ROUTINE W REFLEX MICROSCOPIC
Bilirubin Urine: NEGATIVE
Glucose, UA: NEGATIVE mg/dL
Hgb urine dipstick: NEGATIVE
Ketones, ur: NEGATIVE mg/dL
Leukocytes, UA: NEGATIVE
Nitrite: NEGATIVE
Protein, ur: NEGATIVE mg/dL
Specific Gravity, Urine: 1.016 (ref 1.005–1.030)
Urobilinogen, UA: 0.2 mg/dL (ref 0.0–1.0)
pH: 5.5 (ref 5.0–8.0)

## 2012-12-01 LAB — COMPREHENSIVE METABOLIC PANEL
ALT: 25 U/L (ref 0–35)
AST: 20 U/L (ref 0–37)
Albumin: 4.1 g/dL (ref 3.5–5.2)
Alkaline Phosphatase: 69 U/L (ref 39–117)
BUN: 11 mg/dL (ref 6–23)
CO2: 24 mEq/L (ref 19–32)
Calcium: 9.6 mg/dL (ref 8.4–10.5)
Chloride: 102 mEq/L (ref 96–112)
Creatinine, Ser: 0.62 mg/dL (ref 0.50–1.10)
GFR calc Af Amer: >90 mL/min (ref >90)
GFR calc non Af Amer: >90 mL/min (ref >90)
Glucose, Bld: 120 mg/dL — ABNORMAL HIGH (ref 70–99)
Potassium: 4.1 mEq/L (ref 3.5–5.1)
Sodium: 137 mEq/L (ref 135–145)
Total Bilirubin: 0.6 mg/dL (ref 0.3–1.2)
Total Protein: 7.6 g/dL (ref 6.0–8.3)

## 2012-12-01 MED ORDER — METRONIDAZOLE IN NACL 5-0.79 MG/ML-% IV SOLN
500.0000 mg | Freq: Once | INTRAVENOUS | Status: AC
  Administered 2012-12-01: 500 mg via INTRAVENOUS
  Filled 2012-12-01: qty 100

## 2012-12-01 MED ORDER — METRONIDAZOLE 500 MG PO TABS: 500.0000 mg | ORAL_TABLET | Freq: Two times a day (BID) | ORAL | Status: DC

## 2012-12-01 MED ORDER — IOHEXOL 300 MG/ML  SOLN
80.0000 mL | Freq: Once | INTRAMUSCULAR | Status: AC | PRN
  Administered 2012-12-01: 100 mL via INTRAVENOUS

## 2012-12-01 MED ORDER — ONDANSETRON 4 MG PO TBDP: ORAL_TABLET | ORAL | Status: DC

## 2012-12-01 MED ORDER — OXYCODONE-ACETAMINOPHEN 5-325 MG PO TABS: 1.0000 | ORAL_TABLET | ORAL | Status: DC | PRN

## 2012-12-01 MED ORDER — CIPROFLOXACIN IN D5W 400 MG/200ML IV SOLN
400.0000 mg | Freq: Once | INTRAVENOUS | Status: AC
  Administered 2012-12-01: 400 mg via INTRAVENOUS
  Filled 2012-12-01: qty 200

## 2012-12-01 MED ORDER — SODIUM CHLORIDE 0.9 % IV BOLUS (SEPSIS)
500.0000 mL | Freq: Once | INTRAVENOUS | Status: AC
  Administered 2012-12-01: 500 mL via INTRAVENOUS

## 2012-12-01 MED ORDER — IOHEXOL 300 MG/ML  SOLN
25.0000 mL | INTRAMUSCULAR | Status: AC
  Administered 2012-12-01 (×2): 25 mL via ORAL

## 2012-12-01 MED ORDER — MORPHINE SULFATE 4 MG/ML IJ SOLN
4.0000 mg | Freq: Once | INTRAMUSCULAR | Status: AC
  Administered 2012-12-01: 4 mg via INTRAVENOUS
  Filled 2012-12-01: qty 1

## 2012-12-01 MED ORDER — ONDANSETRON HCL 4 MG/2ML IJ SOLN
4.0000 mg | Freq: Once | INTRAMUSCULAR | Status: AC
  Administered 2012-12-01: 4 mg via INTRAVENOUS
  Filled 2012-12-01: qty 2

## 2012-12-01 MED ORDER — CIPROFLOXACIN HCL 500 MG PO TABS: 500.0000 mg | ORAL_TABLET | Freq: Two times a day (BID) | ORAL | Status: DC

## 2012-12-01 NOTE — ED Notes (Signed)
Antibiotics started.  No distress noted.  Dr. Ranae Palms in to see pt.

## 2012-12-01 NOTE — ED Notes (Signed)
Family at bedside. 

## 2012-12-01 NOTE — ED Notes (Signed)
Pt finished drinking 1st cup of contrast. CT made aware.

## 2012-12-01 NOTE — ED Provider Notes (Signed)
History     CSN: [id number]  Arrival date & time 12/01/12  4098   First MD Initiated Contact with Patient 12/01/12 0840      Chief Complaint  Patient presents with  . Abdominal Pain    (Consider location/radiation/quality/duration/timing/severity/associated sxs/prior treatment) HPI Pt with 1 day of LLQ pain similar to previous bouts of diverticulitis. No fever chills, urinary symptoms, blood in stool, V/D. Past Medical History  Diagnosis Date  . Anxiety   . Depression   . GERD (gastroesophageal reflux disease)   . Diverticulosis of colon   . Trigeminal neuralgia   . Basal cell carcinoma of nose     Past Surgical History  Procedure Laterality Date  . Total abdominal hysterectomy w/ bilateral salpingoophorectomy    . Cholecystectomy    . Cesarean section      x 3    Family History  Problem Relation Age of Onset  . Anxiety disorder      fhx  . Depression      fhx  . Diabetes      grandmother  . Crohn's disease Father   . Cancer Mother     breast    History  Substance Use Topics  . Smoking status: Never Smoker   . Smokeless tobacco: Not on file  . Alcohol Use: No    OB History   Grav Para Term Preterm Abortions TAB SAB Ect Mult Living                  Review of Systems  Constitutional: Negative for fever, chills and fatigue.  Respiratory: Negative for shortness of breath.   Cardiovascular: Negative for chest pain and palpitations.  Gastrointestinal: Positive for abdominal pain. Negative for nausea, vomiting, diarrhea, constipation and blood in stool.  Genitourinary: Negative for dysuria, frequency and flank pain.  Musculoskeletal: Negative for myalgias and back pain.  Skin: Negative for rash and wound.  Neurological: Negative for dizziness, weakness, light-headedness, numbness and headaches.  All other systems reviewed and are negative.    Allergies  Other  Home Medications   Current Outpatient Rx  Name  Route  Sig  Dispense  Refill  .  escitalopram (LEXAPRO) 10 MG tablet   Oral   Take 1 tablet (10 mg total) by mouth daily.   100 tablet   3     Office visit for more refills   . ibuprofen (ADVIL,MOTRIN) 200 MG tablet   Oral   Take 600 mg by mouth every 6 (six) hours as needed for pain.         . pseudoephedrine (SUDAFED) 30 MG tablet   Oral   Take 30 mg by mouth every 4 (four) hours as needed for congestion.         . psyllium (METAMUCIL) 58.6 % powder   Oral   Take 1 packet by mouth 3 (three) times daily.         . ciprofloxacin (CIPRO) 500 MG tablet   Oral   Take 1 tablet (500 mg total) by mouth 2 (two) times daily.   20 tablet   0   . metroNIDAZOLE (FLAGYL) 500 MG tablet   Oral   Take 1 tablet (500 mg total) by mouth 2 (two) times daily.   20 tablet   0   . ondansetron (ZOFRAN ODT) 4 MG disintegrating tablet      4mg  ODT q4 hours prn nausea/vomit   8 tablet   0   . Oxcarbazepine (TRILEPTAL) 300 MG tablet  Oral   Take 300 mg by mouth 4 (four) times daily.           Marland Kitchen oxyCODONE-acetaminophen (PERCOCET) 5-325 MG per tablet   Oral   Take 1 tablet by mouth every 4 (four) hours as needed for pain.   20 tablet   0     BP 124/67  Pulse 77  Temp(Src) 99.2 F (37.3 C) (Oral)  Resp 16  SpO2 98%  Physical Exam  Nursing note and vitals reviewed. Constitutional: She is oriented to person, place, and time. She appears well-developed and well-nourished. No distress.  HENT:  Head: Normocephalic and atraumatic.  Mouth/Throat: Oropharynx is clear and moist.  Eyes: EOM are normal. Pupils are equal, round, and reactive to light.  Neck: Normal range of motion. Neck supple.  Cardiovascular: Normal rate and regular rhythm.   Pulmonary/Chest: Effort normal and breath sounds normal. No respiratory distress. She has no wheezes. She has no rales.  Abdominal: Soft. Bowel sounds are normal. She exhibits no distension and no mass. There is tenderness (LLQ TTP, +rebound and guarding. ). There is rebound  and guarding.  Musculoskeletal: Normal range of motion. She exhibits no edema and no tenderness.  Neurological: She is alert and oriented to person, place, and time.  Skin: Skin is warm and dry. No rash noted. No erythema.  Psychiatric: She has a normal mood and affect. Her behavior is normal.    ED Course  Procedures (including critical care time)  Labs Reviewed  CBC WITH DIFFERENTIAL - Abnormal; Notable for the following:    WBC 11.6 (*)    Neutrophils Relative 85 (*)    Neutro Abs 9.9 (*)    Lymphocytes Relative 10 (*)    All other components within normal limits  COMPREHENSIVE METABOLIC PANEL - Abnormal; Notable for the following:    Glucose, Bld 120 (*)    All other components within normal limits  LIPASE, BLOOD  URINALYSIS, ROUTINE W REFLEX MICROSCOPIC   Ct Abdomen Pelvis W Contrast  12/01/2012  *RADIOLOGY REPORT*  Clinical Data: Left lower quadrant abdominal pain.  CT ABDOMEN AND PELVIS WITH CONTRAST  Technique:  Multidetector CT imaging of the abdomen and pelvis was performed following the standard protocol during bolus administration of intravenous contrast.  Contrast: OMNIPAQUE IOHEXOL 300 MG/ML  SOLN  Comparison: 10/14/2003.  Findings: The lung bases are clear.  No pleural effusion and/or pulmonary nodule.  There are a few small areas of subpleural dependent atelectasis.  The liver demonstrates mild diffuse fatty infiltration but no focal hepatic lesions or intrahepatic biliary dilatation.  The gallbladder is surgically absent.  No common bile duct dilatation. The pancreas is normal.  The spleen is borderline enlarged measuring 14 x 10 x 10 cm.  No focal lesions.  The adrenal glands and kidneys are unremarkable.  The stomach, duodenum and small bowel are unremarkable.  No inflammatory changes or mass lesions.  There is focal diverticulitis involving the distal descending colon with wall thickening, edema and surrounding inflammation.  No complicating features such as  perforation or abscess.  The remainder the sigmoid colon demonstrates diverticulosis.  No mesenteric or retroperitoneal masses or adenopathy.  Small scattered nodes are noted.  The aorta is normal in caliber.  The major branch vessels are normal.  Minimal atherosclerotic calcifications.  The bladder is mildly distended.  The uterus is surgically absent. No pelvic mass, adenopathy or significant free pelvic fluid collections.  No inguinal mass or adenopathy.  No significant bony findings.  IMPRESSION:  Uncomplicated diverticulitis involving the distal descending colon.   Original Report Authenticated By: Rudie Meyer, M.D.      1. Diverticulitis       MDM   Pt continues to be well appearing. D/C home to f/u with Gi. Return precautions given.        Loren Racer, MD 12/01/12 1438

## 2012-12-01 NOTE — ED Notes (Signed)
Pt states that she began to have LLQ abd pain onset last pm.  No distress noted.  States that the pain feels the same as her normal diverticulitis.  Resp symmetrical and unlabored.

## 2012-12-20 ENCOUNTER — Telehealth: Payer: Self-pay | Admitting: Family Medicine

## 2012-12-20 MED ORDER — METRONIDAZOLE 250 MG PO TABS: 250.0000 mg | ORAL_TABLET | Freq: Two times a day (BID) | ORAL | Status: DC

## 2012-12-20 MED ORDER — CIPROFLOXACIN HCL 250 MG PO TABS: 250.0000 mg | ORAL_TABLET | Freq: Two times a day (BID) | ORAL | Status: DC

## 2012-12-20 NOTE — Telephone Encounter (Signed)
Patient Information:  Caller Name: Merrily  Phone: [phone]  Patient: Krystal Monroe, Krystal Monroe  Gender: Female  DOB: 1951/07/03  Age: 61 Years  PCP: Kelle Darting [facility])  Office Follow Up:  Does the office need to follow up with this patient?: Yes  Instructions For The Office: OFFICE PLEASE FOLLOW UP WITH PT REGARDING MEDICATION REQUESTS.  PT USES [facility]  RN Note:  pt is requesting another course of Cipro and Flagyl to get rid of soreness in her abdomen.  (Pt states this happened before where she needed to have a second course of antibiotics to get rid of symptoms)  Symptoms  Reason For Call & Symptoms: seen in the ER and diagnosed with Diverticulitis.  Pt states she took a whole course of Cipro and Flagyl.  Pt states she is still having some residual soreness in lower abdomen.  Reviewed Health History In EMR: Yes  Reviewed Medications In EMR: Yes  Reviewed Allergies In EMR: Yes  Reviewed Surgeries / Procedures: Yes  Date of Onset of Symptoms: 12/01/2012  Guideline(s) Used:  Abdominal Pain - Female  Disposition Per Guideline:   See Today in Office  Reason For Disposition Reached:   Age > 60 years  Advice Given:  N/A  Patient Refused Recommendation:  Patient Requests Prescription  Pt requests Cipro and Flagyl prescriptions

## 2012-12-20 NOTE — Telephone Encounter (Signed)
Per Dr Tawanna Cooler.  cipro and flagyl sent to pharmacy.

## 2013-04-06 ENCOUNTER — Ambulatory Visit (INDEPENDENT_AMBULATORY_CARE_PROVIDER_SITE_OTHER): Payer: 59 | Admitting: Internal Medicine

## 2013-04-06 ENCOUNTER — Encounter: Payer: Self-pay | Admitting: Internal Medicine

## 2013-04-06 VITALS — BP 120/80 | HR 76 | Temp 98.5°F | Wt 180.0 lb

## 2013-04-06 DIAGNOSIS — J069 Acute upper respiratory infection, unspecified: Secondary | ICD-10-CM

## 2013-04-06 DIAGNOSIS — J029 Acute pharyngitis, unspecified: Secondary | ICD-10-CM

## 2013-04-06 MED ORDER — HYDROCODONE-HOMATROPINE 5-1.5 MG/5ML PO SYRP: 5.0000 mL | ORAL_SOLUTION | Freq: Four times a day (QID) | ORAL | Status: DC | PRN

## 2013-04-06 MED ORDER — HYDROCODONE-HOMATROPINE 5-1.5 MG/5ML PO SYRP: 5.0000 mL | ORAL_SOLUTION | Freq: Four times a day (QID) | ORAL | Status: AC | PRN

## 2013-04-06 NOTE — Patient Instructions (Signed)
Acute bronchitis symptoms for less than 10 days are generally not helped by antibiotics.  Take over-the-counter expectorants and cough medications such as  Mucinex DM.  Call if there is no improvement in 5 to 7 days or if he developed worsening cough, fever, or new symptoms, such as shortness of breath or chest pain.    

## 2013-04-06 NOTE — Progress Notes (Signed)
Subjective:    Patient ID: Krystal Monroe, female    DOB: September 20, 1950, 62 y.o.   MRN: [id number]  HPI  62 year old patient who presents with a one-week history of sore throat. This is her chief complaint but also has had some bilateral ear pain right greater than the left. She describes postnasal drip and largely nonproductive cough. She has really felt unwell. She states that she slept her report last night due to cough and sore throat. She is concerned about a strep pharyngitis. She has been using ibuprofen 800 mg 3 times daily.  Past Medical History  Diagnosis Date  . Anxiety   . Depression   . GERD (gastroesophageal reflux disease)   . Diverticulosis of colon   . Trigeminal neuralgia   . Basal cell carcinoma of nose     History   Social History  . Marital Status: Married    Spouse Name: N/A    Number of Children: N/A  . Years of Education: N/A   Occupational History  . Not on file.   Social History Main Topics  . Smoking status: Never Smoker   . Smokeless tobacco: Not on file  . Alcohol Use: No  . Drug Use: No  . Sexual Activity:    Other Topics Concern  . Not on file   Social History Narrative  . No narrative on file    Past Surgical History  Procedure Laterality Date  . Total abdominal hysterectomy w/ bilateral salpingoophorectomy    . Cholecystectomy    . Cesarean section      x 3    Family History  Problem Relation Age of Onset  . Anxiety disorder      fhx  . Depression      fhx  . Diabetes      grandmother  . Crohn's disease Father   . Cancer Mother     breast    Allergies  Allergen Reactions  . Other Hives and Rash    Magnavist Contrast    Current Outpatient Prescriptions on File Prior to Visit  Medication Sig Dispense Refill  . escitalopram (LEXAPRO) 10 MG tablet Take 1 tablet (10 mg total) by mouth daily.  100 tablet  3  . ibuprofen (ADVIL,MOTRIN) 200 MG tablet Take 600 mg by mouth every 6 (six) hours as needed for pain.      Marland Kitchen  ondansetron (ZOFRAN ODT) 4 MG disintegrating tablet 4mg  ODT q4 hours prn nausea/vomit  8 tablet  0  . Oxcarbazepine (TRILEPTAL) 300 MG tablet Take 300 mg by mouth 4 (four) times daily.        . pseudoephedrine (SUDAFED) 30 MG tablet Take 30 mg by mouth every 4 (four) hours as needed for congestion.      . psyllium (METAMUCIL) 58.6 % powder Take 1 packet by mouth 3 (three) times daily.       No current facility-administered medications on file prior to visit.    BP 120/80  Pulse 76  Temp(Src) 98.5 F (36.9 C) (Oral)  Wt 180 lb (81.647 kg)  BMI 30.88 kg/m2  SpO2 96%       Review of Systems  Constitutional: Positive for activity change, appetite change and fatigue.  HENT: Positive for ear pain, sore throat, rhinorrhea and postnasal drip. Negative for hearing loss, congestion, dental problem, sinus pressure and tinnitus.   Eyes: Negative for pain, discharge and visual disturbance.  Respiratory: Positive for cough. Negative for shortness of breath.   Cardiovascular: Negative for chest pain,  palpitations and leg swelling.  Gastrointestinal: Negative for nausea, vomiting, abdominal pain, diarrhea, constipation, blood in stool and abdominal distention.  Genitourinary: Negative for dysuria, urgency, frequency, hematuria, flank pain, vaginal bleeding, vaginal discharge, difficulty urinating, vaginal pain and pelvic pain.  Musculoskeletal: Negative for joint swelling, arthralgias and gait problem.  Skin: Negative for rash.  Neurological: Negative for dizziness, syncope, speech difficulty, weakness, numbness and headaches.  Hematological: Negative for adenopathy.  Psychiatric/Behavioral: Negative for behavioral problems, dysphoric mood and agitation. The patient is not nervous/anxious.        Objective:   Physical Exam  Constitutional: She is oriented to person, place, and time. She appears well-developed and well-nourished.  HENT:  Head: Normocephalic.  Right Ear: External ear normal.   Left Ear: External ear normal.  Oropharynx is erythematous without exudate  Tympanic membranes are both normal  No cervical adenopathy  Eyes: Conjunctivae and EOM are normal. Pupils are equal, round, and reactive to light.  Neck: Normal range of motion. Neck supple. No thyromegaly present.  Cardiovascular: Normal rate, regular rhythm, normal heart sounds and intact distal pulses.   Pulmonary/Chest: Effort normal and breath sounds normal. No respiratory distress. She has no wheezes. She has no rales.  Abdominal: Soft. Bowel sounds are normal. She exhibits no mass. There is no tenderness.  Musculoskeletal: Normal range of motion.  Lymphadenopathy:    She has no cervical adenopathy.  Neurological: She is alert and oriented to person, place, and time.  Skin: Skin is warm and dry. No rash noted.  Psychiatric: She has a normal mood and affect. Her behavior is normal.          Assessment & Plan:   Viral URI with cough.  We'll continue ibuprofen. Will treat with Hydromet for cough and postnasal drip. Will call if unimproved

## 2013-06-21 ENCOUNTER — Other Ambulatory Visit: Payer: Self-pay

## 2013-08-14 ENCOUNTER — Other Ambulatory Visit (INDEPENDENT_AMBULATORY_CARE_PROVIDER_SITE_OTHER): Payer: 59

## 2013-08-14 DIAGNOSIS — Z0000 Encounter for general adult medical examination without abnormal findings: Secondary | ICD-10-CM

## 2013-08-14 LAB — POCT URINALYSIS DIPSTICK
Bilirubin, UA: NEGATIVE
Glucose, UA: NEGATIVE
Ketones, UA: NEGATIVE
Nitrite, UA: NEGATIVE
Protein, UA: NEGATIVE
Spec Grav, UA: 1.025
Urobilinogen, UA: 0.2
pH, UA: 5

## 2013-08-14 LAB — LIPID PANEL
Cholesterol: 175 mg/dL (ref 0–200)
HDL: 36.8 mg/dL — ABNORMAL LOW (ref >39.00)
LDL Cholesterol: 108 mg/dL — ABNORMAL HIGH (ref 0–99)
Total CHOL/HDL Ratio: 5
Triglycerides: 152 mg/dL — ABNORMAL HIGH (ref 0.0–149.0)
VLDL: 30.4 mg/dL (ref 0.0–40.0)

## 2013-08-14 LAB — CBC WITH DIFFERENTIAL/PLATELET
Basophils Absolute: 0 10*3/uL (ref 0.0–0.1)
Basophils Relative: 0.2 % (ref 0.0–3.0)
Eosinophils Absolute: 0.1 10*3/uL (ref 0.0–0.7)
Eosinophils Relative: 2 % (ref 0.0–5.0)
HCT: 39.3 % (ref 36.0–46.0)
Hemoglobin: 13.3 g/dL (ref 12.0–15.0)
Lymphocytes Relative: 20.8 % (ref 12.0–46.0)
Lymphs Abs: 1.2 10*3/uL (ref 0.7–4.0)
MCHC: 33.9 g/dL (ref 30.0–36.0)
MCV: 83.1 fl (ref 78.0–100.0)
Monocytes Absolute: 0.3 10*3/uL (ref 0.1–1.0)
Monocytes Relative: 5.2 % (ref 3.0–12.0)
Neutro Abs: 4.2 10*3/uL (ref 1.4–7.7)
Neutrophils Relative %: 71.8 % (ref 43.0–77.0)
Platelets: 217 10*3/uL (ref 150.0–400.0)
RBC: 4.73 Mil/uL (ref 3.87–5.11)
RDW: 13.6 % (ref 11.5–14.6)
WBC: 5.8 10*3/uL (ref 4.5–10.5)

## 2013-08-14 LAB — HEPATIC FUNCTION PANEL
ALT: 21 U/L (ref 0–35)
AST: 17 U/L (ref 0–37)
Albumin: 4.3 g/dL (ref 3.5–5.2)
Alkaline Phosphatase: 50 U/L (ref 39–117)
Bilirubin, Direct: 0.1 mg/dL (ref 0.0–0.3)
Total Bilirubin: 0.9 mg/dL (ref 0.3–1.2)
Total Protein: 7.2 g/dL (ref 6.0–8.3)

## 2013-08-14 LAB — BASIC METABOLIC PANEL
BUN: 14 mg/dL (ref 6–23)
CO2: 26 mEq/L (ref 19–32)
Calcium: 9.1 mg/dL (ref 8.4–10.5)
Chloride: 106 mEq/L (ref 96–112)
Creatinine, Ser: 0.7 mg/dL (ref 0.4–1.2)
GFR: 94.17 mL/min (ref >60.00)
Glucose, Bld: 105 mg/dL — ABNORMAL HIGH (ref 70–99)
Potassium: 4.1 mEq/L (ref 3.5–5.1)
Sodium: 137 mEq/L (ref 135–145)

## 2013-08-14 LAB — TSH: TSH: 1 u[IU]/mL (ref 0.35–5.50)

## 2013-08-21 ENCOUNTER — Other Ambulatory Visit: Payer: Self-pay | Admitting: Family Medicine

## 2013-08-21 ENCOUNTER — Encounter: Payer: Self-pay | Admitting: Family Medicine

## 2013-08-21 ENCOUNTER — Ambulatory Visit (INDEPENDENT_AMBULATORY_CARE_PROVIDER_SITE_OTHER): Payer: 59 | Admitting: Family Medicine

## 2013-08-21 VITALS — BP 140/80 | Temp 98.5°F | Ht 62.5 in | Wt 184.0 lb

## 2013-08-21 DIAGNOSIS — R3129 Other microscopic hematuria: Secondary | ICD-10-CM

## 2013-08-21 DIAGNOSIS — F3289 Other specified depressive episodes: Secondary | ICD-10-CM

## 2013-08-21 DIAGNOSIS — F329 Major depressive disorder, single episode, unspecified: Secondary | ICD-10-CM

## 2013-08-21 DIAGNOSIS — G500 Trigeminal neuralgia: Secondary | ICD-10-CM

## 2013-08-21 DIAGNOSIS — K5730 Diverticulosis of large intestine without perforation or abscess without bleeding: Secondary | ICD-10-CM

## 2013-08-21 DIAGNOSIS — L989 Disorder of the skin and subcutaneous tissue, unspecified: Secondary | ICD-10-CM

## 2013-08-21 DIAGNOSIS — K449 Diaphragmatic hernia without obstruction or gangrene: Secondary | ICD-10-CM

## 2013-08-21 DIAGNOSIS — K219 Gastro-esophageal reflux disease without esophagitis: Secondary | ICD-10-CM

## 2013-08-21 LAB — POCT URINALYSIS DIPSTICK
Bilirubin, UA: NEGATIVE
Glucose, UA: NEGATIVE
Ketones, UA: NEGATIVE
Leukocytes, UA: NEGATIVE
Nitrite, UA: NEGATIVE
Protein, UA: NEGATIVE
Spec Grav, UA: 1.01
Urobilinogen, UA: 0.2
pH, UA: 6

## 2013-08-21 MED ORDER — ESCITALOPRAM OXALATE 10 MG PO TABS: ORAL_TABLET | ORAL | Status: DC

## 2013-08-21 MED ORDER — OMEPRAZOLE 40 MG PO CPDR: DELAYED_RELEASE_CAPSULE | ORAL | Status: DC

## 2013-08-21 MED ORDER — SULFAMETHOXAZOLE-TMP DS 800-160 MG PO TABS: 1.0000 | ORAL_TABLET | Freq: Two times a day (BID) | ORAL | Status: DC

## 2013-08-21 NOTE — Patient Instructions (Signed)
Continue your current medications  Start a walking program 20 minutes daily at lunch  Remember to get your eye exam and mammogram this winter  Return in one year sooner if any problems

## 2013-08-21 NOTE — Progress Notes (Signed)
Pre visit review using our clinic review tool, if applicable. No additional management support is needed unless otherwise documented below in the visit note. 

## 2013-08-21 NOTE — Addendum Note (Signed)
Addended by: Westley Hummer B on: 08/21/2013 05:35 PM   Modules accepted: Orders

## 2013-08-21 NOTE — Progress Notes (Signed)
   Subjective:    Patient ID: Krystal Monroe, female    DOB: [birth year], 63 y.o.   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 She undergoing the emergency room. CT scan was done and she was given oral meds she recovered and no sequelae.  She's due for eye exam and mammograms, she gets dental care every 6 months. Colonoscopy and GI  Vaccinations up-to-date   Review of Systems  Constitutional: Negative.   HENT: Negative.   Eyes: Negative.   Respiratory: Negative.   Cardiovascular: Negative.   Gastrointestinal: Negative.   Endocrine: Negative.   Genitourinary: Negative.   Musculoskeletal: Negative.   Allergic/Immunologic: Negative.   Neurological: Negative.   Hematological: Negative.   Psychiatric/Behavioral: Negative.        Objective:   Physical Exam  Constitutional: She appears well-developed and well-nourished.  HENT:  Head: Normocephalic and atraumatic.  Right Ear: External ear normal.  Left Ear: External ear normal.  Nose: Nose normal.  Mouth/Throat: Oropharynx is clear and moist.  Eyes: EOM are normal. Pupils are equal, round, and reactive to light.  Neck: Normal range of motion. Neck supple. No thyromegaly present.  Cardiovascular: Normal rate, regular rhythm, normal heart sounds and intact distal pulses.  Exam reveals no gallop and no friction rub.   No murmur heard. Pulmonary/Chest: Effort normal and breath sounds normal.  Abdominal: Soft. Bowel sounds are normal. She exhibits no distension and no mass. There is no tenderness. There is no rebound.  Genitourinary: Vagina normal. Guaiac negative stool. No vaginal discharge found.  Bilateral breast exam normal  Musculoskeletal: Normal range of motion.  Lymphadenopathy:      She has no cervical adenopathy.  Neurological: She is alert. She has normal reflexes. No cranial nerve deficit. She exhibits normal muscle tone. Coordination normal.  Skin: Skin is warm and dry.  Total body skin exam normal except for dark lesion left flank which we will remove today  Psychiatric: She has a normal mood and affect. Her behavior is normal. Judgment and thought content normal.   no carotid bruit bruits peripheral pulses 2+ and symmetrical        Assessment & Plan:  Healthy female  History of mild depression continue Lexapro 10 mg  Reflux esophagitis continue omeprazole 40 mg daily  History of recurrent diverticulitis plan continue stool softener daily

## 2013-09-10 ENCOUNTER — Ambulatory Visit (INDEPENDENT_AMBULATORY_CARE_PROVIDER_SITE_OTHER): Payer: 59 | Admitting: Family Medicine

## 2013-09-10 ENCOUNTER — Encounter: Payer: Self-pay | Admitting: Family Medicine

## 2013-09-10 VITALS — BP 148/80 | Temp 98.8°F | Wt 181.0 lb

## 2013-09-10 DIAGNOSIS — R3129 Other microscopic hematuria: Secondary | ICD-10-CM | POA: Insufficient documentation

## 2013-09-10 DIAGNOSIS — R319 Hematuria, unspecified: Secondary | ICD-10-CM

## 2013-09-10 LAB — POCT URINALYSIS DIPSTICK
Bilirubin, UA: NEGATIVE
Blood, UA: NEGATIVE
Glucose, UA: NEGATIVE
Ketones, UA: NEGATIVE
Nitrite, UA: NEGATIVE
Protein, UA: NEGATIVE
Spec Grav, UA: 1.015
Urobilinogen, UA: 0.2
pH, UA: 7

## 2013-09-10 NOTE — Patient Instructions (Signed)
Drink lots of water  Remembered him to your bladder 4-5 times daily  Return when necessary

## 2013-09-10 NOTE — Progress Notes (Signed)
Pre visit review using our clinic review tool, if applicable. No additional management support is needed unless otherwise documented below in the visit note. 

## 2013-09-10 NOTE — Progress Notes (Signed)
   Subjective:    Patient ID: Krystal Monroe, female    DOB: [birth year], 63 y.o.   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 We started on Septra DS twice a day for 10 days. She finished her last dose a week ago. She's here today for followup    Review of Systems Review of systems negative    Objective:   Physical Exam Well-developed well-nourished female in no acute distress vital signs stable she's afebrile her urinalysis shows no more hematuria       Assessment & Plan:  Hematuria secondary to minor cystitis resolved with antibiotics return when necessary

## 2014-01-10 ENCOUNTER — Telehealth: Payer: Self-pay | Admitting: Family Medicine

## 2014-01-10 MED ORDER — METRONIDAZOLE 250 MG PO TABS: 250.0000 mg | ORAL_TABLET | Freq: Two times a day (BID) | ORAL | Status: DC

## 2014-01-10 MED ORDER — CIPROFLOXACIN HCL 250 MG PO TABS: 250.0000 mg | ORAL_TABLET | Freq: Two times a day (BID) | ORAL | Status: DC

## 2014-01-10 NOTE — Telephone Encounter (Signed)
Pt is having a diverticulitis flare up and would like a refill on cipro and flagyl call into [facility]

## 2014-01-10 NOTE — Telephone Encounter (Signed)
Rx okay per Dr Sherren Mocha and patient is aware

## 2014-04-02 ENCOUNTER — Ambulatory Visit (INDEPENDENT_AMBULATORY_CARE_PROVIDER_SITE_OTHER): Payer: 59 | Admitting: Family Medicine

## 2014-04-02 ENCOUNTER — Encounter: Payer: Self-pay | Admitting: Family Medicine

## 2014-04-02 VITALS — BP 130/80

## 2014-04-02 DIAGNOSIS — J020 Streptococcal pharyngitis: Secondary | ICD-10-CM

## 2014-04-02 NOTE — Progress Notes (Signed)
   Subjective:    Patient ID: Krystal Monroe, female    DOB: March 16, 1951, 63 y.o.   MRN: [id number]  HPI  Krystal Monroe is a 63 year old married female nonsmoker who comes in today for evaluation of a sore throat for 4 weeks  4 weeks ago she had a bilateral sore throat which now is only left-sided. No fever review of systems otherwise negative   Review of Systems Review of systems negative    Objective:   Physical Exam  Well-developed well-nourished female no acute distress vital signs stable she's afebrile HEENT were negative neck was supple no adenopathy thyroid normal      Assessment & Plan:  Viral sore throat,,,,,,,,,,,,, reassured,,,,,

## 2014-04-02 NOTE — Patient Instructions (Signed)
Return when necessary 

## 2014-04-02 NOTE — Progress Notes (Signed)
Pre visit review using our clinic review tool, if applicable. No additional management support is needed unless otherwise documented below in the visit note. 

## 2014-08-26 ENCOUNTER — Other Ambulatory Visit: Payer: Self-pay | Admitting: Family Medicine

## 2014-08-26 LAB — HM MAMMOGRAPHY

## 2015-01-17 ENCOUNTER — Encounter: Payer: Self-pay | Admitting: Family Medicine

## 2015-02-10 ENCOUNTER — Other Ambulatory Visit: Payer: Self-pay

## 2015-02-21 ENCOUNTER — Encounter: Payer: Self-pay | Admitting: Gastroenterology

## 2015-02-21 ENCOUNTER — Encounter: Payer: Self-pay | Admitting: Internal Medicine

## 2015-03-13 ENCOUNTER — Telehealth: Payer: Self-pay | Admitting: Family Medicine

## 2015-03-13 MED ORDER — OXCARBAZEPINE 300 MG PO TABS: 300.0000 mg | ORAL_TABLET | Freq: Four times a day (QID) | ORAL | Status: DC

## 2015-03-13 NOTE — Telephone Encounter (Signed)
[facility] - Client [facility]  Patient Name: Krystal Monroe  DOB: [birth year]    Initial Comment Caller states she her jaw hurts.    Nurse Assessment  Nurse: Germain Osgood, RN, Opal Sidles Date/Time Eilene Ghazi Time): 03/13/2015 4:37:06 PM  Confirm and document reason for call. If symptomatic, describe symptoms. ---Caller reports she has trigeminal neuralgia. Had stopped all the medications she was on about 2 years ago. Is now having a tic about every 2 minutes Has Trileptol at home but is about 66 years old.  Has the patient traveled out of the country within the last 30 days? ---No  Does the patient require triage? ---Yes  Related visit to physician within the last 2 weeks? ---No  Does the PT have any chronic conditions? (i.e. diabetes, asthma, etc.) ---Yes  List chronic conditions. ---Diverticulitis     Guidelines    Guideline Title Affirmed Question Affirmed Notes  Face Pain [1] SEVERE pain (e.g., excruciating) AND [2] not improved after 2 hours of pain medicine    Final Disposition User   See Physician within 4 Hours (or PCP triage) Germain Osgood, RN, Jane    Comments  Patient refused outcome and was advised someone will call them back. NO time frame given.   Referrals  GO TO FACILITY REFUSED   Disagree/Comply: Disagree  Disagree/Comply Reason: Disagree with instructions

## 2015-03-13 NOTE — Telephone Encounter (Signed)
Spoke with patient and she just needed a refill of her medication.  Refill sent.

## 2015-08-17 HISTORY — PX: ROTATOR CUFF REPAIR: SHX139

## 2015-09-08 ENCOUNTER — Encounter: Payer: Self-pay | Admitting: *Deleted

## 2015-09-11 ENCOUNTER — Encounter: Payer: Self-pay | Admitting: Gastroenterology

## 2015-09-25 ENCOUNTER — Other Ambulatory Visit: Payer: 59

## 2015-09-26 ENCOUNTER — Other Ambulatory Visit (INDEPENDENT_AMBULATORY_CARE_PROVIDER_SITE_OTHER): Payer: Medicare Other

## 2015-09-26 DIAGNOSIS — Z0000 Encounter for general adult medical examination without abnormal findings: Secondary | ICD-10-CM

## 2015-09-26 DIAGNOSIS — F329 Major depressive disorder, single episode, unspecified: Secondary | ICD-10-CM

## 2015-09-26 LAB — LIPID PANEL
Cholesterol: 194 mg/dL (ref 0–200)
HDL: 37.6 mg/dL — ABNORMAL LOW (ref >39.00)
LDL Cholesterol: 125 mg/dL — ABNORMAL HIGH (ref 0–99)
NonHDL: 156.47
Total CHOL/HDL Ratio: 5
Triglycerides: 157 mg/dL — ABNORMAL HIGH (ref 0.0–149.0)
VLDL: 31.4 mg/dL (ref 0.0–40.0)

## 2015-09-26 LAB — BASIC METABOLIC PANEL
BUN: 14 mg/dL (ref 6–23)
CO2: 29 mEq/L (ref 19–32)
Calcium: 9.6 mg/dL (ref 8.4–10.5)
Chloride: 103 mEq/L (ref 96–112)
Creatinine, Ser: 0.69 mg/dL (ref 0.40–1.20)
GFR: 90.43 mL/min (ref >60.00)
Glucose, Bld: 103 mg/dL — ABNORMAL HIGH (ref 70–99)
Potassium: 4.1 mEq/L (ref 3.5–5.1)
Sodium: 137 mEq/L (ref 135–145)

## 2015-09-26 LAB — HEPATIC FUNCTION PANEL
ALT: 15 U/L (ref 0–35)
AST: 14 U/L (ref 0–37)
Albumin: 4.4 g/dL (ref 3.5–5.2)
Alkaline Phosphatase: 48 U/L (ref 39–117)
Bilirubin, Direct: 0.1 mg/dL (ref 0.0–0.3)
Total Bilirubin: 0.8 mg/dL (ref 0.2–1.2)
Total Protein: 7.3 g/dL (ref 6.0–8.3)

## 2015-09-26 LAB — POC URINALSYSI DIPSTICK (AUTOMATED)
Bilirubin, UA: NEGATIVE
Blood, UA: NEGATIVE
Glucose, UA: NEGATIVE
Ketones, UA: NEGATIVE
Leukocytes, UA: NEGATIVE
Nitrite, UA: NEGATIVE
Protein, UA: NEGATIVE
Spec Grav, UA: 1.01
Urobilinogen, UA: 0.2
pH, UA: 5.5

## 2015-09-26 LAB — TSH: TSH: 1.04 u[IU]/mL (ref 0.35–4.50)

## 2015-09-28 LAB — CBC WITH DIFFERENTIAL/PLATELET
Basophils Absolute: 0 10*3/uL (ref 0.0–0.1)
Basophils Relative: 0.1 % (ref 0.0–3.0)
Eosinophils Absolute: 0.1 10*3/uL (ref 0.0–0.7)
Eosinophils Relative: 1.8 % (ref 0.0–5.0)
HCT: 43.6 % (ref 36.0–46.0)
Hemoglobin: 13.2 g/dL (ref 12.0–15.0)
Lymphocytes Relative: 15.4 % (ref 12.0–46.0)
Lymphs Abs: 1.2 10*3/uL (ref 0.7–4.0)
MCHC: 30.3 g/dL (ref 30.0–36.0)
MCV: 92.8 fl (ref 78.0–100.0)
Monocytes Absolute: 0.5 10*3/uL (ref 0.1–1.0)
Monocytes Relative: 6.3 % (ref 3.0–12.0)
Neutro Abs: 6 10*3/uL (ref 1.4–7.7)
Neutrophils Relative %: 76.4 % (ref 43.0–77.0)
Platelets: 245 10*3/uL (ref 150.0–400.0)
RBC: 4.7 Mil/uL (ref 3.87–5.11)
RDW: 14.4 % (ref 11.5–15.5)
WBC: 7.8 10*3/uL (ref 4.0–10.5)

## 2015-10-01 ENCOUNTER — Ambulatory Visit (INDEPENDENT_AMBULATORY_CARE_PROVIDER_SITE_OTHER): Payer: Medicare Other | Admitting: Family Medicine

## 2015-10-01 ENCOUNTER — Encounter: Payer: Self-pay | Admitting: Family Medicine

## 2015-10-01 VITALS — BP 110/80 | Temp 98.2°F | Ht 63.0 in | Wt 177.0 lb

## 2015-10-01 DIAGNOSIS — Z0000 Encounter for general adult medical examination without abnormal findings: Secondary | ICD-10-CM

## 2015-10-01 DIAGNOSIS — Z86010 Personal history of colonic polyps: Secondary | ICD-10-CM | POA: Diagnosis not present

## 2015-10-01 DIAGNOSIS — K219 Gastro-esophageal reflux disease without esophagitis: Secondary | ICD-10-CM

## 2015-10-01 DIAGNOSIS — F32A Depression, unspecified: Secondary | ICD-10-CM

## 2015-10-01 DIAGNOSIS — F329 Major depressive disorder, single episode, unspecified: Secondary | ICD-10-CM

## 2015-10-01 DIAGNOSIS — Z23 Encounter for immunization: Secondary | ICD-10-CM | POA: Diagnosis not present

## 2015-10-01 DIAGNOSIS — K5730 Diverticulosis of large intestine without perforation or abscess without bleeding: Secondary | ICD-10-CM

## 2015-10-01 DIAGNOSIS — Z85828 Personal history of other malignant neoplasm of skin: Secondary | ICD-10-CM

## 2015-10-01 MED ORDER — ESCITALOPRAM OXALATE 10 MG PO TABS: 10.0000 mg | ORAL_TABLET | Freq: Every day | ORAL | Status: DC

## 2015-10-01 MED ORDER — ESTROGENS, CONJUGATED 0.625 MG/GM VA CREA: 1.0000 | TOPICAL_CREAM | Freq: Every day | VAGINAL | Status: DC

## 2015-10-01 MED ORDER — OMEPRAZOLE 40 MG PO CPDR: DELAYED_RELEASE_CAPSULE | ORAL | Status: DC

## 2015-10-01 NOTE — Progress Notes (Signed)
Pre visit review using our clinic review tool, if applicable. No additional management support is needed unless otherwise documented below in the visit note. 

## 2015-10-01 NOTE — Patient Instructions (Signed)
Try to decrease the Prilosec to 1 tablet Monday Wednesday Friday  Continue Lexapro 10 mg  Return for removal of the lesion on your chest  Small amounts of hormonal cream 3 times weekly.........Marland Kitchen Astro glide OTC  Walk 30 minutes daily  Return in one year for general physical exam sooner if any problem  Meticulous use of sunscreens

## 2015-10-01 NOTE — Progress Notes (Signed)
Subjective:    Patient ID: Krystal Monroe, female    DOB: 10/18/1950, 65 y.o.   MRN: [id number]  HPI [name] is a 65 year old married female nonsmoker G3 P3........ one son died of a motor vehicle accident 15 years ago..... 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  She gets routine eye care, dental care, BSE monthly, annual mammography. Last mammogram showed a cystic lesion. Ultrasound was done and showed a six-month follow-up which is normal.  Last colonoscopy 2011 shows some diverticuli...... she's had bouts of diverticulitis in the past..... And polyps but she was told to come back in 10 years  She takes 10 mg of Lexapro bedtime to help with her sleep and Prilosec 40 mg daily because of chronic reflux esophagitis  [name] uterus and ovaries removed many years ago for nonmalignant reasons. She does have trouble with vaginal dryness.  Vaccinations up-to-date    Review of Systems  Constitutional: Negative.   HENT: Negative.   Eyes: Negative.   Respiratory: Negative.   Cardiovascular: Negative.   Gastrointestinal: Negative.   Endocrine: Negative.   Genitourinary: Negative.   Musculoskeletal: Negative.   Skin: Negative.   Allergic/Immunologic: Negative.   Neurological: Negative.   Hematological: Negative.   Psychiatric/Behavioral: Negative.        Objective:   Physical Exam  Constitutional: She is oriented to person, place, and time. She appears well-developed and well-nourished.  HENT:  Head: Normocephalic and atraumatic.  Right Ear: External ear normal.  Left Ear: External ear normal.  Nose: Nose normal.  Mouth/Throat: Oropharynx is clear and moist.  Eyes: EOM are normal. Pupils are equal, round, and reactive to light.  Neck: Normal range of motion. Neck supple. No JVD present. No tracheal deviation present. No thyromegaly  present.  Cardiovascular: Normal rate, regular rhythm, normal heart sounds and intact distal pulses.  Exam reveals no gallop and no friction rub.   No murmur heard. No carotid nor aortic bruits peripheral pulses 2+ and symmetrical  Pulmonary/Chest: Effort normal and breath sounds normal. No stridor. No respiratory distress. She has no wheezes. She has no rales. She exhibits no tenderness.  Abdominal: Soft. Bowel sounds are normal. She exhibits no distension and no mass. There is no tenderness. There is no rebound and no guarding.  Genitourinary:  Bilateral breast exam normal  Musculoskeletal: Normal range of motion. She exhibits no edema or tenderness.  Lymphadenopathy:    She has no cervical adenopathy.  Neurological: She is alert and oriented to person, place, and time. She has normal reflexes. No cranial nerve deficit. She exhibits normal muscle tone. Coordination normal.  Skin: Skin is warm and dry. No rash noted. No erythema. No pallor.  Total body skin exam normal except for scar in her nose. She had a skin cancer removed the Mohs surgery this past year  Psychiatric: She has a normal mood and affect. Her behavior is normal. Judgment and thought content normal.  Nursing note and vitals reviewed.         Assessment & Plan:  Healthy female  History of mild depression......... continue Lexapro 10 mg  Chronic reflux esophagitis......... trial of Prilosec Monday Wednesday Friday  History of trigeminal neuralgia,,,,,,,,, currently asymptomatic  Status post TAH and BSO for nonmalignant reasons asymptomatic except for vaginal dryness,,,,,, trial of Premarin vaginal cream  History of diverticulosis and diverticulitis....... currently asymptomatic  History of  skin cancer on her nose 2016 resolve with Mohs surgery

## 2015-10-02 ENCOUNTER — Other Ambulatory Visit: Payer: 59

## 2015-10-07 ENCOUNTER — Encounter: Payer: 59 | Admitting: Family Medicine

## 2015-11-06 MED FILL — OMEPRAZOLE DR 40 MG CAPSULE: 40 | 90 days supply | Qty: 90 | Fill #0

## 2015-11-06 MED FILL — ESCITALOPRAM 10 MG TABLET: 10 | 90 days supply | Qty: 90 | Fill #0

## 2015-11-24 ENCOUNTER — Ambulatory Visit: Payer: Medicare Other | Admitting: Family Medicine

## 2016-01-27 ENCOUNTER — Telehealth: Payer: Self-pay | Admitting: Gastroenterology

## 2016-01-27 NOTE — Telephone Encounter (Signed)
Doc of the Day Patient reports low abdominal tenderness that "feels just like when I have had diverticulitis in the past". Walking causes a pressure sensation in the same area. Denies fever, dysuria or blood in her stools. She takes daily fiber supplement. The patient had Cipro 250 mg and Flagyl 250 mg which she has started taking. The course will last a total of 5 days. She is scheduled for the first available appointment with an APP on 02/02/16. Please advise how to proceed for now.

## 2016-01-27 NOTE — Telephone Encounter (Signed)
Take Cipro and Flagyl twice daily for one week and keep appointment with APP. Modify diet to low residue. If she were to develop worsening abdominal pain, she may need to go to the emergency room.

## 2016-01-27 NOTE — Telephone Encounter (Signed)
Patient is advised.  

## 2016-02-02 ENCOUNTER — Ambulatory Visit (INDEPENDENT_AMBULATORY_CARE_PROVIDER_SITE_OTHER): Payer: Medicare Other | Admitting: Gastroenterology

## 2016-02-02 ENCOUNTER — Encounter: Payer: Self-pay | Admitting: Gastroenterology

## 2016-02-02 VITALS — BP 118/68 | HR 80 | Ht 63.0 in | Wt 176.0 lb

## 2016-02-02 DIAGNOSIS — K5792 Diverticulitis of intestine, part unspecified, without perforation or abscess without bleeding: Secondary | ICD-10-CM

## 2016-02-02 DIAGNOSIS — Z86010 Personal history of colonic polyps: Secondary | ICD-10-CM

## 2016-02-02 MED ORDER — METRONIDAZOLE 250 MG PO TABS: 500.0000 mg | ORAL_TABLET | Freq: Two times a day (BID) | ORAL | Status: DC

## 2016-02-02 MED ORDER — CIPROFLOXACIN HCL 500 MG PO TABS: 500.0000 mg | ORAL_TABLET | Freq: Two times a day (BID) | ORAL | Status: DC

## 2016-02-02 MED ORDER — NA SULFATE-K SULFATE-MG SULF 17.5-3.13-1.6 GM/177ML PO SOLN: 1.0000 | Freq: Once | ORAL | Status: DC

## 2016-02-02 MED FILL — CIPROFLOXACIN HCL 500 MG TA: 500 | 7 days supply | Qty: 14 | Fill #0

## 2016-02-02 MED FILL — metroNIDAZOLE 250 MG TABS: 250 | 14 days supply | Qty: 56 | Fill #0

## 2016-02-02 NOTE — Patient Instructions (Signed)
You have been scheduled for a colonoscopy. Please follow written instructions given to you at your visit today.  Please pick up your prep supplies at the pharmacy within the next 1-3 days. If you use inhalers (even only as needed), please bring them with you on the day of your procedure. Your physician has requested that you go to www.startemmi.com and enter the access code given to you at your visit today. This web site gives a general overview about your procedure. However, you should still follow specific instructions given to you by our office regarding your preparation for the procedure.  We have sent medications to your pharmacy for you to pick up at your convenience.  

## 2016-02-02 NOTE — Progress Notes (Signed)
02/02/2016 Krystal Monroe [id number] May 06, 1951   HISTORY OF PRESENT ILLNESS:  This is a pleasant 65 year old female who is previously known to Dr. Olevia Perches. She has been followed here for her colonoscopies, the last of which was in December 2011 at which time she had diverticulosis and 2 polyps that were removed, one of which was a tubular adenoma so repeat was recommended in 5 years. She's had issues on and off with diverticulitis as well. She says that she tends to get a flare every 1-2 years. She had documented CT scan evidence of this in April 2014. She tells me that she tends to keep a prescription of Cipro and Flagyl at home so that she can take this as needed when symptoms occur. She says that last week she developed lower abdominal pain/pressure and some bloating similar to previous episodes, but maybe not quite as severe. She had Cipro 250 mg twice a day and Flagyl 250 mg twice a day at home which she took for 5 days and also went on a liquid diet for a couple of days. She does feel like helped but she is still somewhat sore and tender.  Denies nausea, vomiting, fever, chills.  Past Medical History  Diagnosis Date  . Anxiety     son killed in MVA  . Depression     son killed in [place]  . GERD (gastroesophageal reflux disease)   . Diverticulosis of colon   . Trigeminal neuralgia   . Basal cell carcinoma of nose   . Colon polyps     2011  . Asthma     as a child  . Gallstones   . Obesity    Past Surgical History  Procedure Laterality Date  . Total abdominal hysterectomy w/ bilateral salpingoophorectomy    . Cholecystectomy  1999  . Cesarean section      x 3    reports that she has never smoked. She has never used smokeless tobacco. She reports that she does not drink alcohol or use illicit drugs. family history includes Breast cancer in her mother; Crohn's disease in her father; Diabetes in her maternal grandmother. There is no history of Colon cancer. Allergies  Allergen  Reactions  . Other Hives and Rash    Magnavist Contrast      Outpatient Encounter Prescriptions as of 02/02/2016  Medication Sig  . escitalopram (LEXAPRO) 10 MG tablet Take 1 tablet (10 mg total) by mouth daily.  Marland Kitchen ibuprofen (ADVIL,MOTRIN) 200 MG tablet Take 600 mg by mouth every 6 (six) hours as needed for pain.  Marland Kitchen omeprazole (PRILOSEC) 40 MG capsule TAKE 1 CAPSULE BY MOUTH DAILY.  Marland Kitchen Oxcarbazepine (TRILEPTAL) 300 MG tablet Take 1 tablet (300 mg total) by mouth 4 (four) times daily. (Patient taking differently: Take 300 mg by mouth 4 (four) times daily as needed. )  . ciprofloxacin (CIPRO) 500 MG tablet Take 1 tablet (500 mg total) by mouth 2 (two) times daily.  . metroNIDAZOLE (FLAGYL) 250 MG tablet Take 2 tablets (500 mg total) by mouth 2 (two) times daily.  . Na Sulfate-K Sulfate-Mg Sulf 17.5-3.13-1.6 GM/180ML SOLN Take 1 kit by mouth once.  . [DISCONTINUED] conjugated estrogens (PREMARIN) vaginal cream Place 1 Applicatorful vaginally daily. Use as directed   No facility-administered encounter medications on file as of 02/02/2016.     REVIEW OF SYSTEMS  : All other systems reviewed and negative except where noted in the History of Present Illness.   PHYSICAL EXAM: BP 118/68  mmHg  Pulse 80  Ht '5\' 3"'$  (1.6 m)  Wt 176 lb (79.833 kg)  BMI 31.18 kg/m2 General: Well developed white female in no acute distress Head: Normocephalic and atraumatic Eyes:  Sclerae anicteric, conjunctiva pink. Ears: Normal auditory acuity Lungs: Clear throughout to auscultation Heart: Regular rate and rhythm Abdomen: Soft, non-distended.  Normal bowel sounds.  Mild LLQ TTP. Rectal:  Will be done at the time of colonoscopy. Musculoskeletal: Symmetrical with no gross deformities  Skin: No lesions on visible extremities Extremities: No edema  Neurological: Alert oriented x 4, grossly non-focal Psychological:  Alert and cooperative. Normal mood and affect  ASSESSMENT AND PLAN: -LLQ abdominal pain:   Suspect some residual diverticulitis. Will treat with seven-day course of Cipro 500 mg twice a day and Flagyl 500 mg twice a day.  Low fiber diet while having acute flare. -Personal history of adenomatous colon polyps:  She will be scheduled for surveillance colonoscopy (several weeks out since treating for diverticulitis).  The risks, benefits, and alternatives to colonoscopy were discussed with the patient and she consents to proceed.  CC:  Dorena Cookey, MD

## 2016-02-24 ENCOUNTER — Telehealth: Payer: Self-pay | Admitting: Gastroenterology

## 2016-02-26 MED FILL — OMEPRAZOLE DR 40 MG CAPSULE: 40 | 90 days supply | Qty: 90 | Fill #1

## 2016-02-26 MED FILL — ESCITALOPRAM 10 MG TABLET: 10 | 90 days supply | Qty: 90 | Fill #1

## 2016-03-05 DIAGNOSIS — Z85828 Personal history of other malignant neoplasm of skin: Secondary | ICD-10-CM | POA: Diagnosis not present

## 2016-03-05 DIAGNOSIS — Z08 Encounter for follow-up examination after completed treatment for malignant neoplasm: Secondary | ICD-10-CM | POA: Diagnosis not present

## 2016-03-05 DIAGNOSIS — Z1283 Encounter for screening for malignant neoplasm of skin: Secondary | ICD-10-CM | POA: Diagnosis not present

## 2016-03-08 ENCOUNTER — Telehealth: Payer: Self-pay | Admitting: *Deleted

## 2016-03-08 NOTE — Telephone Encounter (Signed)
Called the patient to advise we do have a colonoscopy prep sample  ( Suprep) for her at our front desk.  She was very [employer] attorney. She said she would pick it up tomorrow morning, 03-08-2016.

## 2016-03-11 ENCOUNTER — Telehealth: Payer: Self-pay

## 2016-03-11 NOTE — Telephone Encounter (Signed)
Spoke to patient to see if she still needed a Suprep sample but she stated that Pam had already given her one.

## 2016-03-15 ENCOUNTER — Ambulatory Visit (AMBULATORY_SURGERY_CENTER): Payer: Medicare Other | Admitting: Gastroenterology

## 2016-03-15 ENCOUNTER — Encounter: Payer: Self-pay | Admitting: Gastroenterology

## 2016-03-15 VITALS — BP 123/78 | HR 58 | Temp 99.1°F | Resp 17 | Ht 63.0 in | Wt 176.0 lb

## 2016-03-15 DIAGNOSIS — Z86010 Personal history of colonic polyps: Secondary | ICD-10-CM | POA: Diagnosis not present

## 2016-03-15 DIAGNOSIS — K219 Gastro-esophageal reflux disease without esophagitis: Secondary | ICD-10-CM | POA: Diagnosis not present

## 2016-03-15 DIAGNOSIS — D120 Benign neoplasm of cecum: Secondary | ICD-10-CM

## 2016-03-15 DIAGNOSIS — G4733 Obstructive sleep apnea (adult) (pediatric): Secondary | ICD-10-CM | POA: Diagnosis not present

## 2016-03-15 MED ORDER — SODIUM CHLORIDE 0.9 % IV SOLN: 500.0000 mL | INTRAVENOUS | Status: DC

## 2016-03-15 NOTE — Op Note (Signed)
[facility] Patient Name: Krystal Monroe Procedure Date: 03/15/2016 8:03 AM MRN: [id number] Endoscopist: Mauri Pole , MD Age: 65 Referring MD:  Date of Birth: Mar 26, 1951 Gender: Female Account #: [id number] Procedure:                Colonoscopy Indications:              Surveillance: Personal history of adenomatous                            polyps on last colonoscopy > 5 years ago Medicines:                Monitored Anesthesia Care Procedure:                Pre-Anesthesia Assessment:                           - Prior to the procedure, a History and Physical                            was performed, and patient medications and                            allergies were reviewed. The patient's tolerance of                            previous anesthesia was also reviewed. The risks                            and benefits of the procedure and the sedation                            options and risks were discussed with the patient.                            All questions were answered, and informed consent                            was obtained. Prior Anticoagulants: The patient has                            taken no previous anticoagulant or antiplatelet                            agents. ASA Grade Assessment: II - A patient with                            mild systemic disease. After reviewing the risks                            and benefits, the patient was deemed in                            satisfactory condition to undergo the procedure.  After obtaining informed consent, the colonoscope                            was passed under direct vision. Throughout the                            procedure, the patient's blood pressure, pulse, and                            oxygen saturations were monitored continuously. The                            Model CF-HQ190L [id number]) scope was introduced                            through the anus  and advanced to the the terminal                            ileum, with identification of the appendiceal                            orifice and IC valve. The colonoscopy was performed                            without difficulty. The patient tolerated the                            procedure well. The quality of the bowel                            preparation was good. The terminal ileum, ileocecal                            valve, appendiceal orifice, and rectum were                            photographed. Scope In: 8:15:41 AM Scope Out: 8:33:13 AM Scope Withdrawal Time: 0 hours 8 minutes 24 seconds  Total Procedure Duration: 0 hours 17 minutes 32 seconds  Findings:                 The perianal and digital rectal examinations were                            normal.                           A 11 mm polyp was found in the cecum. The polyp was                            sessile. The polyp was removed with a cold snare.                            Resection and retrieval were complete.  Multiple small and large-mouthed diverticula were                            found in the sigmoid colon and descending colon.                           Non-bleeding internal hemorrhoids were found during                            retroflexion. The hemorrhoids were medium-sized. Complications:            No immediate complications. Estimated Blood Loss:     Estimated blood loss was minimal. Impression:               - One 11 mm polyp in the cecum, removed with a cold                            snare. Resected and retrieved.                           - Severe diverticulosis in the sigmoid colon and                            mild to moderate diverticulosis in the descending                            colon.                           - Non-bleeding internal hemorrhoids. Recommendation:           - Patient has a contact number available for                            emergencies.  The signs and symptoms of potential                            delayed complications were discussed with the                            patient. Return to normal activities tomorrow.                            Written discharge instructions were provided to the                            patient.                           - Resume previous diet.                           - Continue present medications.                           - Await pathology results.                           -  Repeat colonoscopy in 3 years for surveillance.                           - Return to GI clinic PRN. Mauri Pole, MD 03/15/2016 8:39:37 AM This report has been signed electronically.

## 2016-03-15 NOTE — Patient Instructions (Signed)
YOU HAD AN ENDOSCOPIC PROCEDURE TODAY AT [facility]:   Refer to the procedure report that was given to you for any specific questions about what was found during the examination.  If the procedure report does not answer your questions, please call your gastroenterologist to clarify.  If you requested that your care partner not be given the details of your procedure findings, then the procedure report has been included in a sealed envelope for you to review at your convenience later.  YOU SHOULD EXPECT: Some feelings of bloating in the abdomen. Passage of more gas than usual.  Walking can help get rid of the air that was put into your GI tract during the procedure and reduce the bloating. If you had a lower endoscopy (such as a colonoscopy or flexible sigmoidoscopy) you may notice spotting of blood in your stool or on the toilet paper. If you underwent a bowel prep for your procedure, you may not have a normal bowel movement for a few days.  Please Note:  You might notice some irritation and congestion in your nose or some drainage.  This is from the oxygen used during your procedure.  There is no need for concern and it should clear up in a day or so.  SYMPTOMS TO REPORT IMMEDIATELY:   Following lower endoscopy (colonoscopy or flexible sigmoidoscopy):  Excessive amounts of blood in the stool  Significant tenderness or worsening of abdominal pains  Swelling of the abdomen that is new, acute  Fever of 100F or higher   For urgent or emergent issues, a gastroenterologist can be reached at any hour by calling [phone].   DIET: Your first meal following the procedure should be a small meal and then it is ok to progress to your normal diet. Heavy or fried foods are harder to digest and may make you feel nauseous or bloated.  Likewise, meals heavy in dairy and vegetables can increase bloating.  Drink plenty of fluids but you should avoid alcoholic beverages for 24  hours.  ACTIVITY:  You should plan to take it easy for the rest of today and you should NOT DRIVE or use heavy machinery until tomorrow (because of the sedation medicines used during the test).    FOLLOW UP: Our staff will call the number listed on your records the next business day following your procedure to check on you and address any questions or concerns that you may have regarding the information given to you following your procedure. If we do not reach you, we will leave a message.  However, if you are feeling well and you are not experiencing any problems, there is no need to return our call.  We will assume that you have returned to your regular daily activities without incident.  If any biopsies were taken you will be contacted by phone or by letter within the next 1-3 weeks.  Please call us at [phone] if you have not heard about the biopsies in 3 weeks.    SIGNATURES/CONFIDENTIALITY: You and/or your care partner have signed paperwork which will be entered into your electronic medical record.  These signatures attest to the fact that that the information above on your After Visit Summary has been reviewed and is understood.  Full responsibility of the confidentiality of this discharge information lies with you and/or your care-partner.   Handouts were given to your care partner on polyps, diverticulosis, and hemorrhoids. Per Dr. Silverio Decamp lessen amount of fiber in your diet. You  may resume your current medications today. Await biopsy results. Please call if any questions or concerns.   

## 2016-03-15 NOTE — Progress Notes (Signed)
Called to room to assist during endoscopic procedure.  Patient ID and intended procedure confirmed with present staff. Received instructions for my participation in the procedure from the performing physician.  

## 2016-03-15 NOTE — Progress Notes (Signed)
No problems noted in the recovery room. maw 

## 2016-03-15 NOTE — Progress Notes (Signed)
Report to PACU, RN, vss, BBS= Clear.  

## 2016-03-16 ENCOUNTER — Telehealth: Payer: Self-pay

## 2016-03-16 NOTE — Telephone Encounter (Signed)
  Follow up Call-  Call back number 03/15/2016  Post procedure Call Back phone  # [phone]  Permission to leave phone message Yes  Some recent data might be hidden     Patient questions:  Do you have a fever, pain , or abdominal swelling? No. Pain Score  0 *  Have you tolerated food without any problems? Yes.    Have you been able to return to your normal activities? Yes.    Do you have any questions about your discharge instructions: Diet   No. Medications  No. Follow up visit  No.  Do you have questions or concerns about your Care? No.  Actions: * If pain score is 4 or above: No action needed, pain <4.

## 2016-03-26 ENCOUNTER — Encounter: Payer: Self-pay | Admitting: Gastroenterology

## 2016-04-01 ENCOUNTER — Ambulatory Visit: Payer: Medicare Other | Admitting: Gastroenterology

## 2016-04-05 DIAGNOSIS — M7541 Impingement syndrome of right shoulder: Secondary | ICD-10-CM | POA: Diagnosis not present

## 2016-05-07 DIAGNOSIS — M7541 Impingement syndrome of right shoulder: Secondary | ICD-10-CM | POA: Diagnosis not present

## 2016-05-17 ENCOUNTER — Ambulatory Visit: Payer: Medicare Other | Attending: Orthopedic Surgery | Admitting: Physical Therapy

## 2016-05-17 DIAGNOSIS — G8929 Other chronic pain: Secondary | ICD-10-CM | POA: Diagnosis not present

## 2016-05-17 DIAGNOSIS — M6281 Muscle weakness (generalized): Secondary | ICD-10-CM | POA: Diagnosis not present

## 2016-05-17 DIAGNOSIS — M25611 Stiffness of right shoulder, not elsewhere classified: Secondary | ICD-10-CM

## 2016-05-17 DIAGNOSIS — M25511 Pain in right shoulder: Secondary | ICD-10-CM | POA: Insufficient documentation

## 2016-05-17 NOTE — Patient Instructions (Signed)
Instructed patient in gentle right shoulder posterior cuff stretch.

## 2016-05-17 NOTE — Therapy (Signed)
[facility] [place], [place], [address] Phone: [phone]   Fax:  [phone]  Physical Therapy Evaluation  Patient Details  Name: Krystal Monroe MRN: [id number] Date of Birth: 05-Oct-1950 Referring Provider: Justice Britain MD.  Encounter Date: 05/17/2016      PT End of Session - 05/17/16 1419    Visit Number 1   Number of Visits 12   Date for PT Re-Evaluation 06/14/16   PT Start Time 0151   PT Stop Time 0240   PT Time Calculation (min) 49 min   Activity Tolerance Patient tolerated treatment well   Behavior During Therapy [facility] for tasks assessed/performed      Past Medical History:  Diagnosis Date  . Anxiety    son killed in MVA  . Asthma    as a child  . Basal cell carcinoma of nose   . Colon polyps    2011  . Depression    son killed in [place]  . Diverticulosis of colon    diverticulitits  . Gallstones   . GERD (gastroesophageal reflux disease)   . Obesity   . Sleep apnea    pt denies 03-15-16  . Trigeminal neuralgia     Past Surgical History:  Procedure Laterality Date  . CESAREAN SECTION     x 3  . CHOLECYSTECTOMY  1999  . COLONOSCOPY    . TOTAL ABDOMINAL HYSTERECTOMY W/ BILATERAL SALPINGOOPHORECTOMY    . UPPER GASTROINTESTINAL ENDOSCOPY      There were no vitals filed for this visit.       Subjective Assessment - 05/17/16 1425    Subjective The patient states that she began to experience right shoulder pain approximately 6-8 months.  She reports that the pain began to disturb her sleep.  She eventually went to the ortho dr and received an injection and this helped.  She also received a f/u injection that too was helpful.  She reports increased pain with active right shoulder movement but far less pain when she assists her right arm with her left.     Limitations --  "Reaching."   How long can you sit comfortably?     Diagnostic tests X-ray.   Patient Stated Goals Use right shoulder   Currently  in Pain? Yes   Pain Score 4    Pain Location Shoulder   Pain Orientation Right   Pain Descriptors / Indicators Aching;Sharp   Pain Type Chronic pain   Pain Onset More than a month ago   Pain Frequency Constant   Aggravating Factors  Sleeping and certain right shoulder movements.   Pain Relieving Factors No movement.   Multiple Pain Sites No            [facility] PT Assessment - 05/17/16 0001      Assessment   Medical Diagnosis Impingement of right shoulder.   Referring Provider Justice Britain MD.   Onset Date/Surgical Date --  6-8 months ago.   Next MD Visit --  06/14/16.     Precautions   Precautions None     Restrictions   Weight Bearing Restrictions No     Balance Screen   Has the patient fallen in the past 6 months No   Has the patient had a decrease in activity level because of a fear of falling?  No   Is the patient reluctant to leave their home because of a fear of falling?  No     Home Environment  Living Environment Private residence     Prior Function   Level of Independence Independent     Posture/Postural Control   Posture/Postural Control Postural limitations   Postural Limitations Rounded Shoulders;Forward head     ROM / Strength   AROM / PROM / Strength AROM;PROM;Strength     AROM   Overall AROM Comments Right shoulder active flexion= 90 degrees; ER= 70 degrees and behind back to L5-S1 region. and active horiz add= 110 degrees.     PROM   Overall PROM Comments Flexion passively to 145 degrees and ER to 80 degrees.     Strength   Overall Strength Comments Right shoulder IR/ER= 4-/5.     Palpation   Palpation comment Very tender topalpation over right Infraspinatus and especially at humeral attachment.  Tender also over acromial ridge.     Special Tests    Special Tests Rotator Cuff Impingement  Normal DTR's.   Rotator Cuff Impingment tests Neer impingement test;Hawkins- Kennedy test     Neer Impingement test    Findings Negative   Side  Right     Hawkins-Kennedy test   Findings Negative   Side Right     Ambulation/Gait   Gait Comments WNL.                   [facility] Adult PT Treatment/Exercise - 05/17/16 0001      Modalities   Modalities Electrical Stimulation;Moist Heat     Moist Heat Therapy   Number Minutes Moist Heat 20 Minutes   Moist Heat Location --  Right shoulder.     Acupuncturist Location Right shoulder.   Electrical Stimulation Action IFC   Electrical Stimulation Parameters 80-150 Hz x 20 minutes.   Electrical Stimulation Goals Pain                PT Education - 05/17/16 1514    Education provided Yes   Person(s) Educated Patient   Methods Explanation;Demonstration;Verbal cues   Comprehension Verbalized understanding;Returned demonstration          PT Short Term Goals - 05/17/16 1513      PT SHORT TERM GOAL #1   Title Independent with an initial HEP.   Time 2   Period Weeks   Status New           PT Long Term Goals - 05/17/16 1518      PT LONG TERM GOAL #1   Title Independent with an advanced HEP.   Time 4   Period Weeks   Status New     PT LONG TERM GOAL #2   Title Active right shoulder flexion to 150-155 degrees so the patient can easily reach overhead   Time 4   Period Weeks   Status New     PT LONG TERM GOAL #3   Title Active ER to 80 degrees+ to allow for easily donning/doffing of apparel   Time 4   Period Weeks   Status New     PT LONG TERM GOAL #4   Title Increase ROM so patient is able to reach behind back to L2.   Time 4   Period Weeks   Status New     PT LONG TERM GOAL #5   Title Increase right shoulder strength to a solid 4+/5 to increase stability for performance of functional activities   Time 4   Period Weeks   Status New     Additional Long Term Goals  Additional Long Term Goals Yes     PT LONG TERM GOAL #6   Title Perform ADL's with right shoulder pain not exceeding 3/10.   Time 4    Period Weeks   Status New     PT LONG TERM GOAL #7   Title Sleep 6 hours undisturbed by right shoulder pain.   Time 4   Period Weeks   Status New               Plan - 05/17/16 1455    Clinical Impression Statement The patient has limited right shoulder range of motion actively and decreased right shoulder strength.  Her sleep is disturbed by pain.  ADL's are impaired at this time.   Rehab Potential Excellent   PT Frequency 3x / week   PT Duration 4 weeks   PT Treatment/Interventions ADLs/Self Care Home Management;Cryotherapy;Electrical Stimulation;Moist Heat;Ultrasound;Therapeutic activities;Therapeutic exercise;Patient/family education;Passive range of motion;Dry needling   PT Next Visit Plan Combo e'stim/U/S to patient right shoulder acromial ridge and posterior cuff, STW/M, electrical stimulation.  Dry needling is an option as well.  [facility] ER; scapular strengthening, full can; horiz abd.      Patient will benefit from skilled therapeutic intervention in order to improve the following deficits and impairments:  Pain, Decreased activity tolerance, Decreased range of motion, Decreased strength  Visit Diagnosis: Chronic right shoulder pain - Plan: PT plan of care cert/re-cert  Stiffness of right shoulder, not elsewhere classified - Plan: PT plan of care cert/re-cert  Muscle weakness (generalized) - Plan: PT plan of care cert/re-cert      G-Codes - [id number]    Functional Assessment Tool Used Clinical judgement.   Functional Limitation Self care   Self Care Current Status [phone]) At least 40 percent but less than 60 percent impaired, limited or restricted   Self Care Goal Status [id number]) At least 1 percent but less than 20 percent impaired, limited or restricted       Problem List Patient Active Problem List   Diagnosis Date Noted  . History of colonic polyps 02/02/2016  . Diverticulitis of intestine without perforation or abscess without bleeding 02/02/2016  .  Routine general medical examination at a health care facility 10/01/2015  . History of skin cancer 10/01/2015  . Streptococcal sore throat 04/02/2014  . Hematuria, microscopic 09/10/2013  . GERD 12/19/2009  . HIATAL HERNIA 12/19/2009  . COLONIC POLYPS, HX OF 12/19/2009  . OSA (obstructive sleep apnea) 12/03/2009  . NEURALGIA, TRIGEMINAL 08/19/2008  . Depression 05/24/2007  . Diverticulosis of large intestine 05/24/2007  . WEIGHT GAIN 05/24/2007    Amani Nodarse, [place] MPT 05/17/2016, 3:25 PM  [facility] [address] [place], [place], [address] Phone: [phone]   Fax:  [phone]  Name: Krystal Monroe MRN: [id number] Date of Birth: February 07, 1951

## 2016-05-21 ENCOUNTER — Ambulatory Visit: Payer: Medicare Other | Admitting: Physical Therapy

## 2016-05-21 DIAGNOSIS — M25511 Pain in right shoulder: Secondary | ICD-10-CM | POA: Diagnosis not present

## 2016-05-21 DIAGNOSIS — M6281 Muscle weakness (generalized): Secondary | ICD-10-CM

## 2016-05-21 DIAGNOSIS — M25611 Stiffness of right shoulder, not elsewhere classified: Secondary | ICD-10-CM | POA: Diagnosis not present

## 2016-05-21 DIAGNOSIS — G8929 Other chronic pain: Secondary | ICD-10-CM

## 2016-05-21 NOTE — Therapy (Signed)
[facility] [place], [place], [address] Phone: [phone]   Fax:  [phone]  Physical Therapy Treatment  Patient Details  Name: Krystal Monroe MRN: [id number] Date of Birth: 01/25/1951 Referring Provider: Justice Britain MD.  Encounter Date: 05/21/2016      PT End of Session - 05/21/16 1241    Visit Number 2   Number of Visits 12   Date for PT Re-Evaluation 06/14/16   PT Start Time 1030   PT Stop Time 1124   PT Time Calculation (min) 54 min   Activity Tolerance Patient tolerated treatment well   Behavior During Therapy [facility] for tasks assessed/performed      Past Medical History:  Diagnosis Date  . Anxiety    son killed in MVA  . Asthma    as a child  . Basal cell carcinoma of nose   . Colon polyps    2011  . Depression    son killed in [place]  . Diverticulosis of colon    diverticulitits  . Gallstones   . GERD (gastroesophageal reflux disease)   . Obesity   . Sleep apnea    pt denies 03-15-16  . Trigeminal neuralgia     Past Surgical History:  Procedure Laterality Date  . CESAREAN SECTION     x 3  . CHOLECYSTECTOMY  1999  . COLONOSCOPY    . TOTAL ABDOMINAL HYSTERECTOMY W/ BILATERAL SALPINGOOPHORECTOMY    . UPPER GASTROINTESTINAL ENDOSCOPY      There were no vitals filed for this visit.      Subjective Assessment - 05/21/16 1242    Subjective I felt good after that last treatment.   Patient Stated Goals Use right shoulder   Pain Score 3    Pain Location Shoulder   Pain Orientation Right   Pain Descriptors / Indicators Aching;Sharp   Pain Type Chronic pain                         [facility] Adult PT Treatment/Exercise - 05/21/16 0001      Modalities   Modalities Electrical Stimulation;Ultrasound     Moist Heat Therapy   Number Minutes Moist Heat 20 Minutes   Moist Heat Location Shoulder     Electrical Stimulation   Electrical Stimulation Location --  RT SHLD.   Electrical  Stimulation Action IFC   Electrical Stimulation Parameters 80-150 Hz x 20 minutes.   Electrical Stimulation Goals Pain     Ultrasound   Ultrasound Location Affected left shoulder region.   Ultrasound Parameters 1.50 W/CM2 x 12 minutes.     Manual Therapy   Manual Therapy Soft tissue mobilization   Manual therapy comments Left sdly position:  STW/M x 14 minutes to left posterior cuff and acromial ridge area.                  PT Short Term Goals - 05/17/16 1513      PT SHORT TERM GOAL #1   Title Independent with an initial HEP.   Time 2   Period Weeks   Status New           PT Long Term Goals - 05/17/16 1518      PT LONG TERM GOAL #1   Title Independent with an advanced HEP.   Time 4   Period Weeks   Status New     PT LONG TERM GOAL #2   Title Active right shoulder flexion to  150-155 degrees so the patient can easily reach overhead   Time 4   Period Weeks   Status New     PT LONG TERM GOAL #3   Title Active ER to 80 degrees+ to allow for easily donning/doffing of apparel   Time 4   Period Weeks   Status New     PT LONG TERM GOAL #4   Title Increase ROM so patient is able to reach behind back to L2.   Time 4   Period Weeks   Status New     PT LONG TERM GOAL #5   Title Increase right shoulder strength to a solid 4+/5 to increase stability for performance of functional activities   Time 4   Period Weeks   Status New     Additional Long Term Goals   Additional Long Term Goals Yes     PT LONG TERM GOAL #6   Title Perform ADL's with right shoulder pain not exceeding 3/10.   Time 4   Period Weeks   Status New     PT LONG TERM GOAL #7   Title Sleep 6 hours undisturbed by right shoulder pain.   Time 4   Period Weeks   Status New             Patient will benefit from skilled therapeutic intervention in order to improve the following deficits and impairments:     Visit Diagnosis: Chronic right shoulder pain  Stiffness of right  shoulder, not elsewhere classified  Muscle weakness (generalized)     Problem List Patient Active Problem List   Diagnosis Date Noted  . History of colonic polyps 02/02/2016  . Diverticulitis of intestine without perforation or abscess without bleeding 02/02/2016  . Routine general medical examination at a health care facility 10/01/2015  . History of skin cancer 10/01/2015  . Streptococcal sore throat 04/02/2014  . Hematuria, microscopic 09/10/2013  . GERD 12/19/2009  . HIATAL HERNIA 12/19/2009  . COLONIC POLYPS, HX OF 12/19/2009  . OSA (obstructive sleep apnea) 12/03/2009  . NEURALGIA, TRIGEMINAL 08/19/2008  . Depression 05/24/2007  . Diverticulosis of large intestine 05/24/2007  . WEIGHT GAIN 05/24/2007    Lewin Pellow, [place] 05/21/2016, 12:51 PM  [facility] [place], [place], [address] Phone: [phone]   Fax:  [phone]  Name: Krystal Monroe MRN: [id number] Date of Birth: [birth year]

## 2016-05-21 NOTE — Patient Instructions (Signed)
[facility] ER with 2#.

## 2016-05-24 ENCOUNTER — Ambulatory Visit: Payer: Medicare Other | Admitting: Physical Therapy

## 2016-05-24 ENCOUNTER — Encounter: Payer: Self-pay | Admitting: Physical Therapy

## 2016-05-24 DIAGNOSIS — M25511 Pain in right shoulder: Secondary | ICD-10-CM | POA: Diagnosis not present

## 2016-05-24 DIAGNOSIS — M6281 Muscle weakness (generalized): Secondary | ICD-10-CM

## 2016-05-24 DIAGNOSIS — G8929 Other chronic pain: Secondary | ICD-10-CM

## 2016-05-24 DIAGNOSIS — M25611 Stiffness of right shoulder, not elsewhere classified: Secondary | ICD-10-CM

## 2016-05-24 NOTE — Patient Instructions (Addendum)
Progressive Resisted: External Rotation (Side-Lying)    Holding _2___ pound weight, towel under arm, raise right forearm toward ceiling. Keep elbow bent and at side. Repeat _10___ times per set. Do __3__ sets per session. Do __2__ sessions per day.  http://orth.exer.us/878   Copyright  VHI. All rights reserved.  Extremity Flexion (Hook-Lying)    Tighten stomach and slowly lower right arm over head until back begins to arch. Keep trunk rigid. Repeat __10__ times per set. Do __3__ sets per session. Do _2___ sessions per day.  http://orth.exer.us/1088   Copyright  VHI. All rights reserved.

## 2016-05-24 NOTE — Therapy (Signed)
[facility] [place], [place], [address] Phone: [phone]   Fax:  [phone]  Physical Therapy Treatment  Patient Details  Name: Krystal Monroe MRN: [id number] Date of Birth: [birth year] Referring Provider: Justice Britain MD.  Encounter Date: 05/24/2016      PT End of Session - 05/24/16 0930    Visit Number 3   Number of Visits 12   Date for PT Re-Evaluation 06/14/16   PT Start Time 0901   PT Stop Time 0943   PT Time Calculation (min) 42 min   Activity Tolerance Patient tolerated treatment well   Behavior During Therapy [facility] for tasks assessed/performed      Past Medical History:  Diagnosis Date  . Anxiety    son killed in MVA  . Asthma    as a child  . Basal cell carcinoma of nose   . Colon polyps    2011  . Depression    son killed in [place]  . Diverticulosis of colon    diverticulitits  . Gallstones   . GERD (gastroesophageal reflux disease)   . Obesity   . Sleep apnea    pt denies 03-15-16  . Trigeminal neuralgia     Past Surgical History:  Procedure Laterality Date  . CESAREAN SECTION     x 3  . CHOLECYSTECTOMY  1999  . COLONOSCOPY    . TOTAL ABDOMINAL HYSTERECTOMY W/ BILATERAL SALPINGOOPHORECTOMY    . UPPER GASTROINTESTINAL ENDOSCOPY      There were no vitals filed for this visit.      Subjective Assessment - 05/24/16 0911    Subjective Patient reported feeling good after last treatment, some ongoing soreness today   Diagnostic tests X-ray.   Patient Stated Goals Use right shoulder   Currently in Pain? Yes   Pain Score 8    Pain Location Shoulder   Pain Orientation Right   Pain Descriptors / Indicators Aching;Sharp   Pain Type Chronic pain   Pain Onset More than a month ago   Pain Frequency Constant   Aggravating Factors  sleeping and certain movements   Pain Relieving Factors at rest                         [facility] Adult PT Treatment/Exercise - 05/24/16 0001      Exercises   Exercises Shoulder     Shoulder Exercises: Supine   External Rotation Strengthening;Right;Weights  3x10   External Rotation Weight (lbs) 2   Flexion Strengthening;Right  scaption 3x10 no weight     Moist Heat Therapy   Number Minutes Moist Heat 15 Minutes   Moist Heat Location Shoulder     Electrical Stimulation   Electrical Stimulation Location Right shoulder.   Electrical Stimulation Action IFC   Electrical Stimulation Parameters 80-'150hz'$  x15   Electrical Stimulation Goals Pain     Ultrasound   Ultrasound Location right shoulder area of pain   Ultrasound Parameters 1.52/cm2/50%/46mz x116m   Ultrasound Goals Pain     Manual Therapy   Manual Therapy Soft tissue mobilization   Soft tissue mobilization gentle manual and IASTM to right shoulder area of pain                PT Education - 05/24/16 0955    Education provided Yes   Education Details HEP   Person(s) Educated Patient   Methods Explanation;Demonstration;Handout   Comprehension Verbalized understanding;Returned demonstration  PT Short Term Goals - 05/24/16 9833      PT SHORT TERM GOAL #1   Title Independent with an initial HEP.   Time 2   Period Weeks   Status Achieved           PT Long Term Goals - 05/24/16 0949      PT LONG TERM GOAL #1   Title Independent with an advanced HEP.   Time 4   Period Weeks   Status On-going     PT LONG TERM GOAL #2   Title Active right shoulder flexion to 150-155 degrees so the patient can easily reach overhead   Time 4   Period Weeks   Status On-going     PT LONG TERM GOAL #3   Title Active ER to 80 degrees+ to allow for easily donning/doffing of apparel   Time 4   Period Weeks   Status On-going     PT LONG TERM GOAL #4   Title Increase ROM so patient is able to reach behind back to L2.   Time 4   Period Weeks   Status On-going     PT LONG TERM GOAL #5   Title Increase right shoulder strength to a solid 4+/5 to increase  stability for performance of functional activities   Time 4   Period Weeks   Status On-going     PT LONG TERM GOAL #6   Title Perform ADL's with right shoulder pain not exceeding 3/10.   Time 4   Period Weeks   Status On-going     PT LONG TERM GOAL #7   Title Sleep 6 hours undisturbed by right shoulder pain.   Time 4   Period Weeks   Status On-going               Plan - 05/24/16 0956    Clinical Impression Statement Patient tolerated treatment well today. Patient had no increased pain during therapy. Patient progressing with less pain at rest reported and able to lift arm with more ease. Patient given HEP today and independent. Patient met STG #1 other LTG's ongoing due to pain, ROM and strength deficts.   Rehab Potential Excellent   PT Frequency 3x / week   PT Duration 4 weeks   PT Treatment/Interventions ADLs/Self Care Home Management;Cryotherapy;Electrical Stimulation;Moist Heat;Ultrasound;Therapeutic activities;Therapeutic exercise;Patient/family education;Passive range of motion;Dry needling   PT Next Visit Plan cont with POC per MPT Combo e'stim/U/S to patient right shoulder acromial ridge and posterior cuff, STW/M, electrical stimulation.  Dry needling is an option as well.  [facility] ER; scapular strengthening, full can; horiz abd.   Consulted and Agree with Plan of Care Patient      Patient will benefit from skilled therapeutic intervention in order to improve the following deficits and impairments:  Pain, Decreased activity tolerance, Decreased range of motion, Decreased strength  Visit Diagnosis: Chronic right shoulder pain  Stiffness of right shoulder, not elsewhere classified  Muscle weakness (generalized)     Problem List Patient Active Problem List   Diagnosis Date Noted  . History of colonic polyps 02/02/2016  . Diverticulitis of intestine without perforation or abscess without bleeding 02/02/2016  . Routine general medical examination at a health care  facility 10/01/2015  . History of skin cancer 10/01/2015  . Streptococcal sore throat 04/02/2014  . Hematuria, microscopic 09/10/2013  . GERD 12/19/2009  . HIATAL HERNIA 12/19/2009  . COLONIC POLYPS, HX OF 12/19/2009  . OSA (obstructive sleep apnea) 12/03/2009  .  NEURALGIA, TRIGEMINAL 08/19/2008  . Depression 05/24/2007  . Diverticulosis of large intestine 05/24/2007  . WEIGHT GAIN 05/24/2007    Phillips Climes, PTA 05/24/2016, 10:01 AM  [facility] [place], [place], [address] Phone: [phone]   Fax:  [phone]  Name: RUTHETTA KOOPMANN MRN: [id number] Date of Birth: 29-Oct-1950

## 2016-05-31 ENCOUNTER — Encounter: Payer: Self-pay | Admitting: Physical Therapy

## 2016-05-31 ENCOUNTER — Ambulatory Visit: Payer: Medicare Other | Admitting: Physical Therapy

## 2016-05-31 DIAGNOSIS — M25611 Stiffness of right shoulder, not elsewhere classified: Secondary | ICD-10-CM | POA: Diagnosis not present

## 2016-05-31 DIAGNOSIS — M25511 Pain in right shoulder: Secondary | ICD-10-CM | POA: Diagnosis not present

## 2016-05-31 DIAGNOSIS — M6281 Muscle weakness (generalized): Secondary | ICD-10-CM | POA: Diagnosis not present

## 2016-05-31 DIAGNOSIS — Z1231 Encounter for screening mammogram for malignant neoplasm of breast: Secondary | ICD-10-CM | POA: Diagnosis not present

## 2016-05-31 DIAGNOSIS — G8929 Other chronic pain: Secondary | ICD-10-CM

## 2016-05-31 DIAGNOSIS — Z803 Family history of malignant neoplasm of breast: Secondary | ICD-10-CM | POA: Diagnosis not present

## 2016-05-31 LAB — HM MAMMOGRAPHY

## 2016-05-31 NOTE — Therapy (Signed)
[facility] [place], [place], [address] Phone: [phone]   Fax:  [phone]  Physical Therapy Treatment  Patient Details  Name: Krystal Monroe MRN: [id number] Date of Birth: October 02, 1950 Referring Provider: Justice Britain MD.  Encounter Date: 05/31/2016      PT End of Session - 05/31/16 1126    Visit Number 4   Number of Visits 12   Date for PT Re-Evaluation 06/14/16   PT Start Time [id number]   PT Stop Time 1130   PT Time Calculation (min) 55 min   Activity Tolerance Patient tolerated treatment well   Behavior During Therapy [facility] for tasks assessed/performed      Past Medical History:  Diagnosis Date  . Anxiety    son killed in MVA  . Asthma    as a child  . Basal cell carcinoma of nose   . Colon polyps    2011  . Depression    son killed in [place]  . Diverticulosis of colon    diverticulitits  . Gallstones   . GERD (gastroesophageal reflux disease)   . Obesity   . Sleep apnea    pt denies 03-15-16  . Trigeminal neuralgia     Past Surgical History:  Procedure Laterality Date  . CESAREAN SECTION     x 3  . CHOLECYSTECTOMY  1999  . COLONOSCOPY    . TOTAL ABDOMINAL HYSTERECTOMY W/ BILATERAL SALPINGOOPHORECTOMY    . UPPER GASTROINTESTINAL ENDOSCOPY      There were no vitals filed for this visit.      Subjective Assessment - 05/31/16 1030    Subjective Reports that her pain is no worse but no improvement seen at this time.   Diagnostic tests X-ray.   Patient Stated Goals Use right shoulder   Currently in Pain? Yes   Pain Score 4   10/10 nauseating pain when pain "hits me"   Pain Location Shoulder   Pain Orientation Right   Pain Descriptors / Indicators Discomfort   Pain Type Chronic pain   Pain Onset More than a month ago   Pain Frequency Constant            [facility] PT Assessment - 05/31/16 0001      Assessment   Medical Diagnosis Impingement of right shoulder.   Next MD Visit 06/14/2016     Precautions   Precautions None     Restrictions   Weight Bearing Restrictions No                     [facility] Adult PT Treatment/Exercise - 05/31/16 0001      Shoulder Exercises: Supine   Flexion AROM;Both;20 reps   Other Supine Exercises R AROM scaption x3 reps with pain noted     Shoulder Exercises: Seated   External Rotation AROM;Both;20 reps  reported anterior shoulder discomfort   Other Seated Exercises B scapular squeeze x20 reps  discomfort in mid to ant R deltoid     Shoulder Exercises: Pulleys   Flexion Other (comment)  x5 min     Modalities   Modalities Electrical Stimulation;Ultrasound;Moist Heat     Moist Heat Therapy   Number Minutes Moist Heat 15 Minutes   Moist Heat Location Shoulder     Electrical Stimulation   Electrical Stimulation Location R shoulder   Electrical Stimulation Action IFC   Electrical Stimulation Parameters 80-150 hz x15 min   Electrical Stimulation Goals Pain     Ultrasound   Ultrasound  Location R anteriolateral shoulder   Ultrasound Parameters 1.5 w/cm2, 100%, 1 mhz x10 min   Ultrasound Goals Pain     Manual Therapy   Manual Therapy Soft tissue mobilization   Soft tissue mobilization Gentle STW to R Deltoids, Bicep to decrease tightness and pain                  PT Short Term Goals - 05/24/16 0956      PT SHORT TERM GOAL #1   Title Independent with an initial HEP.   Time 2   Period Weeks   Status Achieved           PT Long Term Goals - 05/24/16 0949      PT LONG TERM GOAL #1   Title Independent with an advanced HEP.   Time 4   Period Weeks   Status On-going     PT LONG TERM GOAL #2   Title Active right shoulder flexion to 150-155 degrees so the patient can easily reach overhead   Time 4   Period Weeks   Status On-going     PT LONG TERM GOAL #3   Title Active ER to 80 degrees+ to allow for easily donning/doffing of apparel   Time 4   Period Weeks   Status On-going     PT LONG TERM  GOAL #4   Title Increase ROM so patient is able to reach behind back to L2.   Time 4   Period Weeks   Status On-going     PT LONG TERM GOAL #5   Title Increase right shoulder strength to a solid 4+/5 to increase stability for performance of functional activities   Time 4   Period Weeks   Status On-going     PT LONG TERM GOAL #6   Title Perform ADL's with right shoulder pain not exceeding 3/10.   Time 4   Period Weeks   Status On-going     PT LONG TERM GOAL #7   Title Sleep 6 hours undisturbed by right shoulder pain.   Time 4   Period Weeks   Status On-going               Plan - 05/31/16 1156    Clinical Impression Statement Patient tolerated today's treatment overall fairly well with discomfort noted intermittantly with exercise but especially with AROM scaption. Patient noted pain free pulley today as well as decreased pain with AROM flexion as well. Patient presented with tightness especially in R Deltoids. Normal modalities response noted following removal of the modalities.    Rehab Potential Excellent   PT Frequency 3x / week   PT Duration 4 weeks   PT Treatment/Interventions ADLs/Self Care Home Management;Cryotherapy;Electrical Stimulation;Moist Heat;Ultrasound;Therapeutic activities;Therapeutic exercise;Patient/family education;Passive range of motion;Dry needling   PT Next Visit Plan Continue as symptoms dictate per MPT POC.   Consulted and Agree with Plan of Care Patient      Patient will benefit from skilled therapeutic intervention in order to improve the following deficits and impairments:  Pain, Decreased activity tolerance, Decreased range of motion, Decreased strength  Visit Diagnosis: Chronic right shoulder pain  Stiffness of right shoulder, not elsewhere classified  Muscle weakness (generalized)     Problem List Patient Active Problem List   Diagnosis Date Noted  . History of colonic polyps 02/02/2016  . Diverticulitis of intestine without  perforation or abscess without bleeding 02/02/2016  . Routine general medical examination at a health care facility 10/01/2015  .  History of skin cancer 10/01/2015  . Streptococcal sore throat 04/02/2014  . Hematuria, microscopic 09/10/2013  . GERD 12/19/2009  . HIATAL HERNIA 12/19/2009  . COLONIC POLYPS, HX OF 12/19/2009  . OSA (obstructive sleep apnea) 12/03/2009  . NEURALGIA, TRIGEMINAL 08/19/2008  . Depression 05/24/2007  . Diverticulosis of large intestine 05/24/2007  . WEIGHT GAIN 05/24/2007    Wynelle Fanny, PTA 05/31/2016, 12:00 PM  [facility] [place], [place], [address] Phone: [phone]   Fax:  [phone]  Name: Krystal Monroe MRN: [id number] Date of Birth: Dec 26, 1950

## 2016-06-04 ENCOUNTER — Ambulatory Visit: Payer: Medicare Other | Admitting: Physical Therapy

## 2016-06-04 ENCOUNTER — Encounter: Payer: Self-pay | Admitting: Physical Therapy

## 2016-06-04 DIAGNOSIS — M25511 Pain in right shoulder: Secondary | ICD-10-CM | POA: Diagnosis not present

## 2016-06-04 DIAGNOSIS — M6281 Muscle weakness (generalized): Secondary | ICD-10-CM | POA: Diagnosis not present

## 2016-06-04 DIAGNOSIS — M25611 Stiffness of right shoulder, not elsewhere classified: Secondary | ICD-10-CM

## 2016-06-04 DIAGNOSIS — M7541 Impingement syndrome of right shoulder: Secondary | ICD-10-CM | POA: Diagnosis not present

## 2016-06-04 DIAGNOSIS — G8929 Other chronic pain: Secondary | ICD-10-CM

## 2016-06-04 NOTE — Patient Instructions (Addendum)
Scapular Retraction: Bilateral    Facing anchor, pull arms back, bringing shoulder blades together. Repeat _10___ times per set. Do __2__ sets per session. Do ___2-3_ sessions per day.  http://orth.exer.us/176   Copyright  VHI. All rights reserved.  Strengthening: Resisted External Rotation    Hold tubing in right hand, elbow at side and forearm across body. Rotate forearm out. Repeat __10__ times per set. Do __2__ sets per session. Do __2-3__ sessions per day.  http://orth.exer.us/828   Copyright  VHI. All rights reserved.  Strengthening: Resisted Internal Rotation    Hold tubing in right hand, elbow at side and forearm out. Rotate forearm in across body. Repeat __10__ times per set. Do _2___ sets per session. Do __2-3__ sessions per day.  http://orth.exer.us/830   Copyright  VHI. All rights reserved.  Strengthening: Resisted Extension    Hold tubing in right hand, arm forward. Pull arm back, elbow straight. Repeat __10__ times per set. Do __2__ sets per session. Do _2-3___ sessions per day.  http://orth.exer.us/832   Copyright  VHI. All rights reserved.   

## 2016-06-04 NOTE — Therapy (Addendum)
[facility] [place], [place], [address] Phone: [phone]   Fax:  [phone]  Physical Therapy Treatment  Patient Details  Name: Krystal Monroe MRN: [id number] Date of Birth: April 20, 1951 Referring Provider: Justice Britain MD.  Encounter Date: 06/04/2016      PT End of Session - 06/04/16 0907    Visit Number 5   Number of Visits 12   Date for PT Re-Evaluation 06/14/16   PT Start Time 0905   PT Stop Time 0951   PT Time Calculation (min) 46 min   Activity Tolerance Patient tolerated treatment well   Behavior During Therapy [facility] for tasks assessed/performed      Past Medical History:  Diagnosis Date  . Anxiety    son killed in MVA  . Asthma    as a child  . Basal cell carcinoma of nose   . Colon polyps    2011  . Depression    son killed in [place]  . Diverticulosis of colon    diverticulitits  . Gallstones   . GERD (gastroesophageal reflux disease)   . Obesity   . Sleep apnea    pt denies 03-15-16  . Trigeminal neuralgia     Past Surgical History:  Procedure Laterality Date  . CESAREAN SECTION     x 3  . CHOLECYSTECTOMY  1999  . COLONOSCOPY    . TOTAL ABDOMINAL HYSTERECTOMY W/ BILATERAL SALPINGOOPHORECTOMY    . UPPER GASTROINTESTINAL ENDOSCOPY      There were no vitals filed for this visit.      Subjective Assessment - 06/04/16 0905    Subjective Reports that her shoulder may be a little better but still has intermittant movements that cause nauseating pain. Reports that she sees Dr. Onnie Graham today. Reports that she can raise her arm better but still has pain with reaching.   Diagnostic tests X-ray.   Patient Stated Goals Use right shoulder   Currently in Pain? Yes   Pain Score 1    Pain Location Shoulder   Pain Orientation Right   Pain Descriptors / Indicators Discomfort   Pain Type Chronic pain   Pain Onset More than a month ago            [facility] PT Assessment - 06/04/16 0001      Assessment   Medical Diagnosis Impingement of right shoulder.   Next MD Visit 06/04/2016     Precautions   Precautions None     Restrictions   Weight Bearing Restrictions No                     [facility] Adult PT Treatment/Exercise - 06/04/16 0001      Shoulder Exercises: Seated   Horizontal ABduction Strengthening;Both;20 reps;Theraband   Theraband Level (Shoulder Horizontal ABduction) Level 1 (Yellow)     Shoulder Exercises: Standing   External Rotation Strengthening;Right;20 reps;Theraband   Theraband Level (Shoulder External Rotation) Level 1 (Yellow)   Internal Rotation Strengthening;Right;20 reps;Theraband   Theraband Level (Shoulder Internal Rotation) Level 1 (Yellow)   Extension Strengthening;Right;20 reps;Theraband   Theraband Level (Shoulder Extension) Level 1 (Yellow)   Row Strengthening;Right;20 reps;Theraband   Theraband Level (Shoulder Row) Level 1 (Yellow)     Shoulder Exercises: Pulleys   Flexion Other (comment)  x5 min     Modalities   Modalities Electrical Stimulation;Ultrasound;Moist Heat     Moist Heat Therapy   Number Minutes Moist Heat 15 Minutes   Moist Heat Location Shoulder  Best boy Action ifc   Electrical Stimulation Parameters 1-10 Hz x15 min   Electrical Stimulation Goals Pain     Ultrasound   Ultrasound Location R anterior shoulder   Ultrasound Parameters 1.5 w/cm2, 100%, 1 mhz x10 min   Ultrasound Goals Pain                PT Education - 06/04/16 [phone]    Education provided Yes   Education Details HEP- RW4 with yellow theraband; education to stop any exercise that is increasingly painful   Person(s) Educated Patient   Methods Explanation;Demonstration;Verbal cues;Handout   Comprehension Verbalized understanding;Returned demonstration;Verbal cues required          PT Short Term Goals - 05/24/16 0956      PT SHORT TERM GOAL  #1   Title Independent with an initial HEP.   Time 2   Period Weeks   Status Achieved           PT Long Term Goals - 05/24/16 0949      PT LONG TERM GOAL #1   Title Independent with an advanced HEP.   Time 4   Period Weeks   Status On-going     PT LONG TERM GOAL #2   Title Active right shoulder flexion to 150-155 degrees so the patient can easily reach overhead   Time 4   Period Weeks   Status On-going     PT LONG TERM GOAL #3   Title Active ER to 80 degrees+ to allow for easily donning/doffing of apparel   Time 4   Period Weeks   Status On-going     PT LONG TERM GOAL #4   Title Increase ROM so patient is able to reach behind back to L2.   Time 4   Period Weeks   Status On-going     PT LONG TERM GOAL #5   Title Increase right shoulder strength to a solid 4+/5 to increase stability for performance of functional activities   Time 4   Period Weeks   Status On-going     PT LONG TERM GOAL #6   Title Perform ADL's with right shoulder pain not exceeding 3/10.   Time 4   Period Weeks   Status On-going     PT LONG TERM GOAL #7   Title Sleep 6 hours undisturbed by right shoulder pain.   Time 4   Period Weeks   Status On-going               Plan - 06/04/16 0943    Clinical Impression Statement Patient tolerated today's treatment fairly well with discomfort reported upon arrival and increased discomfort in anterior shoulder. Patient experienced minimal or noticable R anterior shoulder discomfort with resisted row and ER/IR but had pain with horizontal abduction. Patient required moderate multimodal cueing for proper exercise technique. Normal modalities response noted following removal of the modalities. Patient accepted new HEP for shoulder strengthening with yellow theraband with education regarding parameters and technique. Patient was also educated that if any pain is to occur she should stop the exercise to prevent further injury.   Rehab Potential Excellent    PT Frequency 3x / week   PT Duration 4 weeks   PT Treatment/Interventions ADLs/Self Care Home Management;Cryotherapy;Electrical Stimulation;Moist Heat;Ultrasound;Therapeutic activities;Therapeutic exercise;Patient/family education;Passive range of motion;Dry needling   PT Next Visit Plan Continue as symptoms dictate per MPT POC.   PT Home Exercise Plan HEP-  RW4 with yellow theraband   Consulted and Agree with Plan of Care Patient      Patient will benefit from skilled therapeutic intervention in order to improve the following deficits and impairments:  Pain, Decreased activity tolerance, Decreased range of motion, Decreased strength  Visit Diagnosis: Chronic right shoulder pain  Stiffness of right shoulder, not elsewhere classified  Muscle weakness (generalized)     Problem List Patient Active Problem List   Diagnosis Date Noted  . History of colonic polyps 02/02/2016  . Diverticulitis of intestine without perforation or abscess without bleeding 02/02/2016  . Routine general medical examination at a health care facility 10/01/2015  . History of skin cancer 10/01/2015  . Streptococcal sore throat 04/02/2014  . Hematuria, microscopic 09/10/2013  . GERD 12/19/2009  . HIATAL HERNIA 12/19/2009  . COLONIC POLYPS, HX OF 12/19/2009  . OSA (obstructive sleep apnea) 12/03/2009  . NEURALGIA, TRIGEMINAL 08/19/2008  . Depression 05/24/2007  . Diverticulosis of large intestine 05/24/2007  . WEIGHT GAIN 05/24/2007   Ahmed Prima, PTA 06/04/16 11:40 AM Mali Applegate MPT [facility] [place], [place], [address] Phone: [phone]   Fax:  [phone]  Name: ALYZE LAUF MRN: [id number] Date of Birth: 26-Dec-1950  PHYSICAL THERAPY DISCHARGE SUMMARY  Visits from Start of Care: 5.  Current functional level related to goals / functional outcomes: See above.   Remaining deficits: Pt did not return.   Education /  Equipment: HEP. Plan: Patient agrees to discharge.  Patient goals were not met. Patient is being discharged due to not returning since the last visit.  ?????         Mali Applegate MPT

## 2016-06-07 ENCOUNTER — Encounter: Payer: Medicare Other | Admitting: Physical Therapy

## 2016-06-08 ENCOUNTER — Encounter: Payer: Self-pay | Admitting: Family Medicine

## 2016-06-18 DIAGNOSIS — M7541 Impingement syndrome of right shoulder: Secondary | ICD-10-CM | POA: Diagnosis not present

## 2016-06-25 DIAGNOSIS — M7541 Impingement syndrome of right shoulder: Secondary | ICD-10-CM | POA: Diagnosis not present

## 2016-07-01 MED FILL — ESCITALOPRAM 10 MG TABLET: 10 | 90 days supply | Qty: 90 | Fill #2

## 2016-07-13 DIAGNOSIS — M75121 Complete rotator cuff tear or rupture of right shoulder, not specified as traumatic: Secondary | ICD-10-CM | POA: Diagnosis not present

## 2016-07-13 DIAGNOSIS — M19011 Primary osteoarthritis, right shoulder: Secondary | ICD-10-CM | POA: Diagnosis not present

## 2016-07-13 DIAGNOSIS — M75101 Unspecified rotator cuff tear or rupture of right shoulder, not specified as traumatic: Secondary | ICD-10-CM | POA: Diagnosis not present

## 2016-07-13 DIAGNOSIS — M7541 Impingement syndrome of right shoulder: Secondary | ICD-10-CM | POA: Diagnosis not present

## 2016-07-13 DIAGNOSIS — M7501 Adhesive capsulitis of right shoulder: Secondary | ICD-10-CM | POA: Diagnosis not present

## 2016-07-13 DIAGNOSIS — M24111 Other articular cartilage disorders, right shoulder: Secondary | ICD-10-CM | POA: Diagnosis not present

## 2016-07-13 DIAGNOSIS — G8918 Other acute postprocedural pain: Secondary | ICD-10-CM | POA: Diagnosis not present

## 2016-07-23 DIAGNOSIS — Z4789 Encounter for other orthopedic aftercare: Secondary | ICD-10-CM | POA: Diagnosis not present

## 2016-08-22 DIAGNOSIS — J069 Acute upper respiratory infection, unspecified: Secondary | ICD-10-CM | POA: Diagnosis not present

## 2016-08-22 DIAGNOSIS — R05 Cough: Secondary | ICD-10-CM | POA: Diagnosis not present

## 2016-08-23 ENCOUNTER — Other Ambulatory Visit: Payer: Self-pay | Admitting: Family Medicine

## 2016-08-23 DIAGNOSIS — Z4789 Encounter for other orthopedic aftercare: Secondary | ICD-10-CM | POA: Diagnosis not present

## 2016-08-23 MED FILL — OXcarbazepine 300 MG TABS: 300 | 90 days supply | Qty: 360 | Fill #0

## 2016-08-30 ENCOUNTER — Ambulatory Visit (INDEPENDENT_AMBULATORY_CARE_PROVIDER_SITE_OTHER): Payer: Medicare Other | Admitting: Family Medicine

## 2016-08-30 ENCOUNTER — Encounter: Payer: Self-pay | Admitting: Family Medicine

## 2016-08-30 VITALS — BP 160/90 | HR 83 | Temp 97.7°F | Ht 63.0 in | Wt 181.7 lb

## 2016-08-30 DIAGNOSIS — N907 Vulvar cyst: Secondary | ICD-10-CM

## 2016-08-30 MED ORDER — METRONIDAZOLE 500 MG PO TABS: ORAL_TABLET | ORAL | 1 refills | Status: DC

## 2016-08-30 MED ORDER — CIPROFLOXACIN HCL 500 MG PO TABS: 500.0000 mg | ORAL_TABLET | Freq: Two times a day (BID) | ORAL | 1 refills | Status: DC

## 2016-08-30 MED ORDER — LORAZEPAM 1 MG PO TABS: ORAL_TABLET | ORAL | 1 refills | Status: DC

## 2016-08-30 MED FILL — metroNIDAZOLE 500 MG TABS: 500 | 10 days supply | Qty: 20 | Fill #0

## 2016-08-30 MED FILL — CIPROFLOXACIN HCL 500 MG TA: 500 | 10 days supply | Qty: 20 | Fill #0

## 2016-08-30 NOTE — Patient Instructions (Signed)
The lesion is a cyst,,,,,,,, benign,,,,,,,,, if it does increase in size we'll remove it otherwise well leave it alone.  Purchase an Omron pump up digital blood pressure cuff...........Marland Kitchen [employer]  Check your blood pressure 3 times weekly in the morning  Ativan 1 mg...........Marland Kitchen 1/2-1 tablet at hour prior to flying

## 2016-08-30 NOTE — Progress Notes (Signed)
Krystal Monroe is a 66 year old married female nonsmoker who comes in today for evaluation of a lump on her labia  She notices about a month ago. It hard in the size of a marble. No history of trauma  She's also had a history of sleep apnea. She saw Dr. Normajean Baxter 8 years ago. Study was borderline however the last year or 2 the snoring is gotten worse nobody will sleep in the room with her. She was advised to lose weight but has not done that. Weight is 181 pounds.  She and her daughter going to [place]. She requests some Cipro and Flagyl to take with her just in case of diverticulitis flares up. She had a recent colonoscopy which was normal except for the severe diverticulosis.  BP (!) 160/90 (BP Location: Left Arm, Patient Position: Sitting, Cuff Size: Normal)   Pulse 83   Temp 97.7 F (36.5 C) (Oral)   Ht 5\' 3"  (1.6 m)   Wt 181 lb 11.2 oz (82.4 kg)   BMI 32.19 kg/m  Vital signs stable except for blood pressure elevated. Blood pressure at home 120/80  Examination genitalia shows a marble size lesion right labia. It soft rubbery and movable. Bimanual exam negative  Cystic lesion right labia....... observed

## 2016-09-03 DIAGNOSIS — M75101 Unspecified rotator cuff tear or rupture of right shoulder, not specified as traumatic: Secondary | ICD-10-CM | POA: Diagnosis not present

## 2016-09-06 DIAGNOSIS — M75101 Unspecified rotator cuff tear or rupture of right shoulder, not specified as traumatic: Secondary | ICD-10-CM | POA: Diagnosis not present

## 2016-09-10 DIAGNOSIS — M75101 Unspecified rotator cuff tear or rupture of right shoulder, not specified as traumatic: Secondary | ICD-10-CM | POA: Diagnosis not present

## 2016-09-14 DIAGNOSIS — M75101 Unspecified rotator cuff tear or rupture of right shoulder, not specified as traumatic: Secondary | ICD-10-CM | POA: Diagnosis not present

## 2016-09-17 DIAGNOSIS — M75101 Unspecified rotator cuff tear or rupture of right shoulder, not specified as traumatic: Secondary | ICD-10-CM | POA: Diagnosis not present

## 2016-09-20 ENCOUNTER — Ambulatory Visit (INDEPENDENT_AMBULATORY_CARE_PROVIDER_SITE_OTHER): Payer: Medicare Other | Admitting: Family Medicine

## 2016-09-20 ENCOUNTER — Encounter: Payer: Self-pay | Admitting: Family Medicine

## 2016-09-20 VITALS — BP 180/90 | HR 77 | Temp 98.7°F | Ht 63.0 in | Wt 177.6 lb

## 2016-09-20 DIAGNOSIS — I10 Essential (primary) hypertension: Secondary | ICD-10-CM | POA: Diagnosis not present

## 2016-09-20 DIAGNOSIS — G4733 Obstructive sleep apnea (adult) (pediatric): Secondary | ICD-10-CM | POA: Diagnosis not present

## 2016-09-20 MED ORDER — LISINOPRIL-HYDROCHLOROTHIAZIDE 10-12.5 MG PO TABS: 1.0000 | ORAL_TABLET | Freq: Every day | ORAL | 3 refills | Status: DC

## 2016-09-20 MED FILL — LISINOPRIL-HCTZ 10-12.5 MG: 10-12.5 | 90 days supply | Qty: 90 | Fill #0

## 2016-09-20 NOTE — Progress Notes (Signed)
Krystal Monroe is a 66 year old married female nonsmoker........ 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 Who comes in today for follow-up of elevated blood pressure  We saw her while back her blood pressure was elevated. She purchased a home monitor is been monitoring blood pressure at home. Her systolics at home range from [id number] diastolics mid [id number].  BP today right arm sitting position 155/90  Positive family history of hypertension. Her mother had hypertension.  She also has a history of snoring and may have underlying sleep apnea. We discussed the relationship between hypertension and sleep apnea. Will get her set up in pulmonary for further evaluation of the sleep apnea possible issue  BP (!) 180/90 (BP Location: Left Arm, Patient Position: Sitting, Cuff Size: Normal)   Pulse 77   Temp 98.7 F (37.1 C) (Oral)   Ht 5\' 3"  (1.6 m)   Wt 177 lb 9.6 oz (80.6 kg)   BMI 31.46 kg/m  Weight is down 5 pounds since she started diet and exercise program  BP right arm sitting position 150/90  Hypertension,,,,,,,,,, begin Zestoretic 10-12 0.5 daily continue monitoring blood pressure at home follow-up in 4 weeks  #2 question sleep apnea,,,,,,,,, pulmonary consult

## 2016-09-20 NOTE — Patient Instructions (Signed)
Zestoretic 10-12 0.5..........Marland Kitchen 1 daily in the morning  Continue to monitor blood pressure daily  Follow-up March 12  Continue to drink lots of water, no salt, carbohydrate free, and walk 30 minutes daily

## 2016-09-20 NOTE — Progress Notes (Signed)
Pre visit review using our clinic review tool, if applicable. No additional management support is needed unless otherwise documented below in the visit note. 

## 2016-09-21 ENCOUNTER — Encounter: Payer: Self-pay | Admitting: Pulmonary Disease

## 2016-09-21 ENCOUNTER — Ambulatory Visit (INDEPENDENT_AMBULATORY_CARE_PROVIDER_SITE_OTHER): Payer: Medicare Other | Admitting: Pulmonary Disease

## 2016-09-21 DIAGNOSIS — G4733 Obstructive sleep apnea (adult) (pediatric): Secondary | ICD-10-CM

## 2016-09-21 DIAGNOSIS — M75101 Unspecified rotator cuff tear or rupture of right shoulder, not specified as traumatic: Secondary | ICD-10-CM | POA: Diagnosis not present

## 2016-09-21 NOTE — Assessment & Plan Note (Signed)
Patient was noted to have snoring fatigue in 2013. She ended up seeing his sleep doctor and ended up getting a home sleep study. HST showed AHI of 5. As she was not too symptomatic, CPAP treatment was held off.  She had a right rotator cuff surgery under local and general anesthesia couple weeks ago. No note of desaturation or apneas during that episode. Since that time, she was noted to be hypertensive. Because of concern for hypertension and sleep apnea, she wanted to be checked out again for sleep apnea.  She has some snoring, occasional fatigue. For the most part, she remains functional. She sleeps 5-7 hours per night. Can get sleepy in the afternoon. Naps in the weekend. Hypersomnia can affect her functionality. No abnormal behavior and sleep.  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   ESS 12.    Plan :  We discussed about the diagnosis of Obstructive Sleep Apnea (OSA) and implications of untreated OSA. We discussed about CPAP and BiPaP as possible treatment options.    We will schedule the patient for a sleep study. Plan for a home sleep study. Regardless of results, plan to discuss with patient. If moderate, she'll need to be in CPAP therapy. If mild, she may opt to hold off on CPAP unless blood pressure remains an issue. If sleep study is negative, need to ask her if she wants to do lab sleep study. She anticipates she may not be able to sleep in the lab.   Patient was instructed to call the office if he/she has not heard back from the office 1-2 weeks after the sleep study.   Patient was instructed to call the office if he/she is having issues with the PAP device.   We discussed good sleep hygiene.   Patient was advised not to engage in activities requiring concentration and/or vigilance if he/she is sleepy.  Patient was advised not to drive if he/she is sleepy.

## 2016-09-21 NOTE — Progress Notes (Signed)
Subjective:    Patient ID: Krystal Monroe, female    DOB: Sep 19, 1950, 66 y.o.   MRN: [id number]  HPI   This is the case of Krystal Monroe, 66 y.o. Female, who was referred by Dr. Stevie Kern in consultation regarding OSA.   As you very well know, patient Is a nonsmoker, known to have childhood asthma which has been stable.  Patient was noted to have snoring fatigue in 2013. She ended up seeing his sleep doctor and ended up getting a home sleep study. HST showed AHI of 5. As she was not too symptomatic, CPAP treatment was held off.  She had a right rotator cuff surgery under local and general anesthesia couple weeks ago. No note of desaturation or apneas during that episode. Since that time, she was noted to be hypertensive. Because of concern for hypertension and sleep apnea, she wanted to be checked out again for sleep apnea.  She has some snoring, occasional fatigue. For the most part, she remains functional. She sleeps 5-7 hours per night. Can get sleepy in the afternoon. Naps in the weekend. Hypersomnia can affect her functionality. No abnormal behavior and sleep.  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   ESS 12.   Review of Systems  Constitutional: Negative.  Negative for fever and unexpected weight change.  HENT: Positive for sinus pressure. Negative for congestion, dental problem, ear pain, nosebleeds, postnasal drip, rhinorrhea, sneezing, sore throat and trouble swallowing.   Eyes: Negative.  Negative for redness and itching.  Respiratory: Negative.  Negative for cough, chest tightness, shortness of breath and wheezing.   Cardiovascular: Negative.  Negative for palpitations and leg swelling.  Gastrointestinal: Negative.  Negative for nausea and vomiting.  Endocrine: Negative.   Genitourinary: Negative.  Negative for dysuria.  Musculoskeletal: Negative.  Negative for joint swelling.  Skin: Negative.  Negative for rash.  Allergic/Immunologic: Negative.     Neurological: Positive for headaches.  Hematological: Negative.  Does not bruise/bleed easily.  Psychiatric/Behavioral: Negative.  Negative for dysphoric mood. The patient is not nervous/anxious.    Past Medical History:  Diagnosis Date  . Anxiety    son killed in MVA  . Asthma    as a child  . Basal cell carcinoma of nose   . Colon polyps    2011  . Depression    son killed in [place]  . Diverticulosis of colon    diverticulitits  . Gallstones   . GERD (gastroesophageal reflux disease)   . Obesity   . Sleep apnea    pt denies 03-15-16  . Trigeminal neuralgia    Basal Cell skin.  (-) DVT,  Family History  Problem Relation Age of Onset  . Diabetes Maternal Grandmother   . Crohn's disease Father   . Breast cancer Mother   . Colon cancer Neg Hx   . Esophageal cancer Neg Hx   . Rectal cancer Neg Hx   . Stomach cancer Neg Hx      Past Surgical History:  Procedure Laterality Date  . CESAREAN SECTION     x 3  . CHOLECYSTECTOMY  1999  . COLONOSCOPY    . TOTAL ABDOMINAL HYSTERECTOMY W/ BILATERAL SALPINGOOPHORECTOMY    . UPPER GASTROINTESTINAL ENDOSCOPY      Social History   Social History  . Marital status: Married    Spouse name: N/A  . Number of children: 3  . Years of education: N/A   Occupational History  .  RN- [employer]    Social History Main Topics  . Smoking status: Never Smoker  . Smokeless tobacco: Never Used  . Alcohol use No  . Drug use: No  . Sexual activity: Not on file   Other Topics Concern  . Not on file   Social History Narrative  . No narrative on file     Allergies  Allergen Reactions  . Other Hives and Rash    Magnavist Contrast     Outpatient Medications Prior to Visit  Medication Sig Dispense Refill  . escitalopram (LEXAPRO) 10 MG tablet Take 1 tablet (10 mg total) by mouth daily. 100 tablet 3  . ibuprofen (ADVIL,MOTRIN) 200 MG tablet Take 600 mg by mouth every 6 (six) hours as needed for pain.    Marland Kitchen lisinopril-hydrochlorothiazide  (PRINZIDE,ZESTORETIC) 10-12.5 MG tablet Take 1 tablet by mouth daily. 90 tablet 3  . omeprazole (PRILOSEC) 40 MG capsule TAKE 1 CAPSULE BY MOUTH DAILY. (Patient taking differently: TAKE 1 CAPSULE BY MOUTH every other day) 100 capsule 3  . Oxcarbazepine (TRILEPTAL) 300 MG tablet TAKE 1 TABLET BY MOUTH 4 TIMES DAILY 360 tablet 3  . Psyllium (METAMUCIL FIBER PO) Take by mouth daily.    . ciprofloxacin (CIPRO) 500 MG tablet Take 1 tablet (500 mg total) by mouth 2 (two) times daily. (Patient not taking: Reported on 09/21/2016) 20 tablet 1  . LORazepam (ATIVAN) 1 MG tablet 1/2-1 tablet 1 hour prior to flying (Patient not taking: Reported on 09/21/2016) 20 tablet 1  . metroNIDAZOLE (FLAGYL) 500 MG tablet 1 by mouth twice a day for diverticulitis (Patient not taking: Reported on 09/21/2016) 20 tablet 1   Facility-Administered Medications Prior to Visit  Medication Dose Route Frequency Provider Last Rate Last Dose  . 0.9 %  sodium chloride infusion  500 mL Intravenous Continuous Kavitha Nandigam V, MD       No orders of the defined types were placed in this encounter.       Objective:   Physical Exam   Vitals:  Vitals:   09/21/16 1602  BP: 136/80  Pulse: 69  SpO2: 97%  Weight: 178 lb 6.4 oz (80.9 kg)  Height: 5\' 3"  (1.6 m)    Constitutional/General:  Pleasant, well-nourished, well-developed, not in any distress,  Comfortably seating.  Well kempt  Body mass index is 31.6 kg/m. Wt Readings from Last 3 Encounters:  09/21/16 178 lb 6.4 oz (80.9 kg)  09/20/16 177 lb 9.6 oz (80.6 kg)  08/30/16 181 lb 11.2 oz (82.4 kg)    HEENT: Pupils equal and reactive to light and accommodation. Anicteric sclerae. Normal nasal mucosa.   No oral  lesions,  mouth clear,  oropharynx clear, no postnasal drip. (-) Oral thrush. No dental caries.  Airway - Mallampati class III  Neck: No masses. Midline trachea. No JVD, (-) LAD. (-) bruits appreciated.  Respiratory/Chest: Grossly normal chest. (-) deformity.  (-) Accessory muscle use.  Symmetric expansion. (-) Tenderness on palpation.  Resonant on percussion.  Diminished BS on both lower lung zones. (-) wheezing, crackles, rhonchi (-) egophony  Cardiovascular: Regular rate and  rhythm, heart sounds normal, no murmur or gallops, no peripheral edema  Gastrointestinal:  Normal bowel sounds. Soft, non-tender. No hepatosplenomegaly.  (-) masses.   Musculoskeletal:  Normal muscle tone. Normal gait.   Extremities: Grossly normal. (-) clubbing, cyanosis.  (-) edema  Skin: (-) rash,lesions seen.   Neurological/Psychiatric : alert, oriented to time, place, person. Normal mood and affect  Assessment & Plan:  OSA (obstructive sleep apnea)  Patient was noted to have snoring fatigue in 2013. She ended up seeing his sleep doctor and ended up getting a home sleep study. HST showed AHI of 5. As she was not too symptomatic, CPAP treatment was held off.  She had a right rotator cuff surgery under local and general anesthesia couple weeks ago. No note of desaturation or apneas during that episode. Since that time, she was noted to be hypertensive. Because of concern for hypertension and sleep apnea, she wanted to be checked out again for sleep apnea.  She has some snoring, occasional fatigue. For the most part, she remains functional. She sleeps 5-7 hours per night. Can get sleepy in the afternoon. Naps in the weekend. Hypersomnia can affect her functionality. No abnormal behavior and sleep.  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   ESS 12.    Plan :  We discussed about the diagnosis of Obstructive Sleep Apnea (OSA) and implications of untreated OSA. We discussed about CPAP and BiPaP as possible treatment options.    We will schedule the patient for a sleep study. Plan for a home sleep study. Regardless of results, plan to discuss with patient. If moderate, she'll need to be in CPAP therapy. If mild, she may opt to  hold off on CPAP unless blood pressure remains an issue. If sleep study is negative, need to ask her if she wants to do lab sleep study. She anticipates she may not be able to sleep in the lab.   Patient was instructed to call the office if he/she has not heard back from the office 1-2 weeks after the sleep study.   Patient was instructed to call the office if he/she is having issues with the PAP device.   We discussed good sleep hygiene.   Patient was advised not to engage in activities requiring concentration and/or vigilance if he/she is sleepy.  Patient was advised not to drive if he/she is sleepy.       Thank you very much for letting me participate in this patient's care. Please do not hesitate to give me a call if you have any questions or concerns regarding the treatment plan.   Patient will follow up with me in 6-8 weeks.     Monica Becton, MD 09/21/2016   5:45 PM Pulmonary and [place] Pager: [phone] Office: [phone], Fax: [phone] r

## 2016-09-21 NOTE — Patient Instructions (Signed)
It was a pleasure taking care of you today!  We will schedule you to have a sleep study to determine if you have sleep apnea.   We will get a home sleep test.  You will be instructed to come back to the office to get an apparatus to sleep with overnight.  Once we have the apparatus, it will usually take [place] 1-2 weeks to read the study and get back at you with results of the test.  Please give [place] a call in 2 weeks after your study if you do not hear back from [place].   If the sleep study is positive, we will order you a CPAP  machine.  Please call the office if you do NOT receive your machine in the next 1-2 weeks.   Please make sure you use your CPAP device everytime you sleep.  We will monitor the usage of your machine per your insurance requirement.  Your insurance company may take the machine from you if you are not using it regularly.   Please clean the mask, tubings, filter, water reservoir with soapy water every week.  Please use distilled water for the water reservoir.   Please call the office or your machine provider (DME company) if you are having issues with the device.   Dr. Corrie Dandy will call you with results.

## 2016-09-24 DIAGNOSIS — M75101 Unspecified rotator cuff tear or rupture of right shoulder, not specified as traumatic: Secondary | ICD-10-CM | POA: Diagnosis not present

## 2016-09-27 DIAGNOSIS — Z4789 Encounter for other orthopedic aftercare: Secondary | ICD-10-CM | POA: Diagnosis not present

## 2016-09-28 DIAGNOSIS — M75101 Unspecified rotator cuff tear or rupture of right shoulder, not specified as traumatic: Secondary | ICD-10-CM | POA: Diagnosis not present

## 2016-09-30 ENCOUNTER — Other Ambulatory Visit: Payer: Self-pay | Admitting: Family Medicine

## 2016-10-01 DIAGNOSIS — M75101 Unspecified rotator cuff tear or rupture of right shoulder, not specified as traumatic: Secondary | ICD-10-CM | POA: Diagnosis not present

## 2016-10-01 MED FILL — ESCITALOPRAM 10 MG TABLET: 10 | 90 days supply | Qty: 90 | Fill #0

## 2016-10-04 ENCOUNTER — Other Ambulatory Visit: Payer: Self-pay | Admitting: Pulmonary Disease

## 2016-10-04 ENCOUNTER — Telehealth: Payer: Self-pay | Admitting: Pulmonary Disease

## 2016-10-04 DIAGNOSIS — G4733 Obstructive sleep apnea (adult) (pediatric): Secondary | ICD-10-CM

## 2016-10-04 NOTE — Telephone Encounter (Signed)
  Please call the pt and tell the pt the [place]  showed OSA, Mild-moderate   Pt stops breathing  12  times an hour.   Home sleep study was done on :   Please tell her my recommendation is to try autocpap. If she agrees, please order autoCPAP 5-15 cm H2O. Patient will need a mask fitting session. Patient will need a 1 month download. If she does not agree let me know.   Patient needs to be seen by me or any of the NPs/APPs  4-6 weeks after obtaining the cpap machine. Let me know if you receive this.   Thanks!   J. Shirl Harris, MD 10/04/2016, 2:34 PM

## 2016-10-04 NOTE — Telephone Encounter (Signed)
Spoke with patient and informed her of results. Pt was informed that she will have a mask fitting for CPAP and will be receiving a machine. Pt was instructed to contact office once she receives machine to schedule follow up appointment. Pt did not have any questions. Orders were placed for CPAP machine and mask fitting. Nothing further is needed.

## 2016-10-05 ENCOUNTER — Encounter: Payer: Self-pay | Admitting: Pulmonary Disease

## 2016-10-05 DIAGNOSIS — M75101 Unspecified rotator cuff tear or rupture of right shoulder, not specified as traumatic: Secondary | ICD-10-CM | POA: Diagnosis not present

## 2016-10-05 DIAGNOSIS — G4733 Obstructive sleep apnea (adult) (pediatric): Secondary | ICD-10-CM | POA: Diagnosis not present

## 2016-10-05 NOTE — Telephone Encounter (Signed)
AD please advise. Thanks. 

## 2016-10-06 ENCOUNTER — Other Ambulatory Visit: Payer: Self-pay | Admitting: *Deleted

## 2016-10-06 DIAGNOSIS — G4733 Obstructive sleep apnea (adult) (pediatric): Secondary | ICD-10-CM

## 2016-10-07 ENCOUNTER — Encounter: Payer: Self-pay | Admitting: Family Medicine

## 2016-10-07 ENCOUNTER — Telehealth: Payer: Self-pay | Admitting: Pulmonary Disease

## 2016-10-07 NOTE — Telephone Encounter (Signed)
   I extensively discussed sleep study results with the patient. We decided to go ahead with CPAP therapy and see if it works for the patient. If not, she will try an oral device and she would prefer Dr. Oneal Grout.  Jasmine : pls order cpap as I had written before:   " please order autoCPAP 5-15 cm H2O. Patient will need a mask fitting session. Patient will need a 1 month download. If she does not agree let me know.   Patient needs to be seen by me or any of the NPs/APPs  4-6 weeks after obtaining the cpap machine. "  Since patients lately are complaining about [facility], can we not use [facility] ?   Thanks!  J. Shirl Harris, MD 10/07/2016, 9:38 PM [facility] Pager [phone] After 3 pm or if no answer, call [phone]

## 2016-10-07 NOTE — Telephone Encounter (Signed)
AD pt would like to know when her sleep apnea test results will be released so she may review these online.  Please advise. Thanks

## 2016-10-08 ENCOUNTER — Other Ambulatory Visit: Payer: Self-pay | Admitting: Emergency Medicine

## 2016-10-08 DIAGNOSIS — M75101 Unspecified rotator cuff tear or rupture of right shoulder, not specified as traumatic: Secondary | ICD-10-CM | POA: Diagnosis not present

## 2016-10-08 NOTE — Telephone Encounter (Signed)
Spoke with [name], she states we can use her original order and make a note to not send to [facility]. [name] will send the order to another DME. Nothing further is needed at this time.

## 2016-10-12 DIAGNOSIS — M75101 Unspecified rotator cuff tear or rupture of right shoulder, not specified as traumatic: Secondary | ICD-10-CM | POA: Diagnosis not present

## 2016-10-13 MED ORDER — AMLODIPINE BESYLATE 5 MG PO TABS: 5.0000 mg | ORAL_TABLET | Freq: Every day | ORAL | 1 refills | Status: DC

## 2016-10-13 MED FILL — AMLODIPINE BESYLATE 5 MG TA: 5 | 30 days supply | Qty: 30 | Fill #0

## 2016-10-15 DIAGNOSIS — M75101 Unspecified rotator cuff tear or rupture of right shoulder, not specified as traumatic: Secondary | ICD-10-CM | POA: Diagnosis not present

## 2016-10-22 ENCOUNTER — Other Ambulatory Visit: Payer: Self-pay | Admitting: Family Medicine

## 2016-10-22 DIAGNOSIS — M75101 Unspecified rotator cuff tear or rupture of right shoulder, not specified as traumatic: Secondary | ICD-10-CM | POA: Diagnosis not present

## 2016-10-22 DIAGNOSIS — K219 Gastro-esophageal reflux disease without esophagitis: Secondary | ICD-10-CM

## 2016-10-22 NOTE — Telephone Encounter (Signed)
Scheduled for follow up 11/08/16

## 2016-10-23 DIAGNOSIS — H578 Other specified disorders of eye and adnexa: Secondary | ICD-10-CM | POA: Diagnosis not present

## 2016-10-25 DIAGNOSIS — H04123 Dry eye syndrome of bilateral lacrimal glands: Secondary | ICD-10-CM | POA: Diagnosis not present

## 2016-11-08 ENCOUNTER — Encounter: Payer: Self-pay | Admitting: Family Medicine

## 2016-11-08 ENCOUNTER — Ambulatory Visit (INDEPENDENT_AMBULATORY_CARE_PROVIDER_SITE_OTHER): Payer: Medicare Other | Admitting: Family Medicine

## 2016-11-08 VITALS — BP 136/80 | Temp 97.7°F | Ht 63.0 in | Wt 175.0 lb

## 2016-11-08 DIAGNOSIS — M85829 Other specified disorders of bone density and structure, unspecified upper arm: Secondary | ICD-10-CM | POA: Diagnosis not present

## 2016-11-08 DIAGNOSIS — Z4789 Encounter for other orthopedic aftercare: Secondary | ICD-10-CM | POA: Diagnosis not present

## 2016-11-08 DIAGNOSIS — I10 Essential (primary) hypertension: Secondary | ICD-10-CM

## 2016-11-08 DIAGNOSIS — G8929 Other chronic pain: Secondary | ICD-10-CM | POA: Diagnosis not present

## 2016-11-08 DIAGNOSIS — M7501 Adhesive capsulitis of right shoulder: Secondary | ICD-10-CM | POA: Diagnosis not present

## 2016-11-08 DIAGNOSIS — M25511 Pain in right shoulder: Secondary | ICD-10-CM | POA: Diagnosis not present

## 2016-11-08 MED ORDER — AMLODIPINE BESYLATE 10 MG PO TABS: 10.0000 mg | ORAL_TABLET | Freq: Every day | ORAL | 3 refills | Status: DC

## 2016-11-08 MED FILL — AMLODIPINE BESYLATE 10 MG T: 10 | 90 days supply | Qty: 90 | Fill #0

## 2016-11-08 NOTE — Progress Notes (Signed)
Krystal Monroe is a 66 year old married female nonsmoker who comes in today for follow-up of hypertension  We saw her about 6 weeks ago with elevated blood pressure. We started her on ACE inhibitor but she developed a severe cough. The ace was discontinued she was started on Norvasc 5 mg daily. Blood pressure now 150/80. No edema  BP 136/80 (BP Location: Left Arm, Patient Position: Sitting, Cuff Size: Normal)   Temp 97.7 F (36.5 C) (Oral)   Ht 5\' 3"  (1.6 m)   Wt 175 lb (79.4 kg)   BMI 31.00 kg/m  BP right arm sitting position 136/80........... however in the morning it's 277 systolic.  #1 hypertension......... increase Norvasc to 10 mg daily follow-up in one month.

## 2016-11-08 NOTE — Patient Instructions (Signed)
Increase the Norvasc to 7.5 mg daily  BP check daily  Follow-up in 4-6 weeks

## 2016-11-08 NOTE — Progress Notes (Signed)
Pre visit review using our clinic review tool, if applicable. No additional management support is needed unless otherwise documented below in the visit note. 

## 2016-11-09 DIAGNOSIS — M75101 Unspecified rotator cuff tear or rupture of right shoulder, not specified as traumatic: Secondary | ICD-10-CM | POA: Diagnosis not present

## 2016-11-10 ENCOUNTER — Telehealth: Payer: Self-pay | Admitting: Pulmonary Disease

## 2016-11-10 MED FILL — OMEPRAZOLE DR 40 MG CAPSULE: 40 | 90 days supply | Qty: 90 | Fill #0

## 2016-11-10 NOTE — Telephone Encounter (Signed)
Error.Stanley A Dalton ° °

## 2016-11-15 ENCOUNTER — Other Ambulatory Visit: Payer: Self-pay | Admitting: Family Medicine

## 2016-11-15 ENCOUNTER — Ambulatory Visit (INDEPENDENT_AMBULATORY_CARE_PROVIDER_SITE_OTHER)
Admission: RE | Admit: 2016-11-15 | Discharge: 2016-11-15 | Disposition: A | Discharge status: Home / Self Care | Payer: Medicare Other | Source: Ambulatory Visit | Attending: Family Medicine | Admitting: Family Medicine

## 2016-11-15 ENCOUNTER — Other Ambulatory Visit (HOSPITAL_BASED_OUTPATIENT_CLINIC_OR_DEPARTMENT_OTHER): Payer: Medicare Other

## 2016-11-15 DIAGNOSIS — E2839 Other primary ovarian failure: Secondary | ICD-10-CM

## 2016-11-16 DIAGNOSIS — M75101 Unspecified rotator cuff tear or rupture of right shoulder, not specified as traumatic: Secondary | ICD-10-CM | POA: Diagnosis not present

## 2016-11-19 DIAGNOSIS — M75101 Unspecified rotator cuff tear or rupture of right shoulder, not specified as traumatic: Secondary | ICD-10-CM | POA: Diagnosis not present

## 2016-12-01 MED FILL — AMOXICILLIN 500 MG CAPSULE: 500 | 7 days supply | Qty: 21 | Fill #0

## 2016-12-02 DIAGNOSIS — H578 Other specified disorders of eye and adnexa: Secondary | ICD-10-CM | POA: Diagnosis not present

## 2016-12-24 ENCOUNTER — Telehealth: Payer: Self-pay | Admitting: Family Medicine

## 2016-12-24 NOTE — Telephone Encounter (Signed)
Called to see if pt wanted to schedule awv - left message ( will be here 5/15 - awv at 10? )

## 2016-12-25 ENCOUNTER — Encounter (HOSPITAL_COMMUNITY): Payer: Self-pay

## 2016-12-25 ENCOUNTER — Emergency Department (HOSPITAL_COMMUNITY)
Admission: EM | Admit: 2016-12-25 | Discharge: 2016-12-25 | Disposition: A | Discharge status: Home / Self Care | Payer: Medicare Other | Attending: Emergency Medicine | Admitting: Emergency Medicine

## 2016-12-25 DIAGNOSIS — R031 Nonspecific low blood-pressure reading: Secondary | ICD-10-CM | POA: Diagnosis not present

## 2016-12-25 DIAGNOSIS — R55 Syncope and collapse: Secondary | ICD-10-CM | POA: Insufficient documentation

## 2016-12-25 DIAGNOSIS — Z79899 Other long term (current) drug therapy: Secondary | ICD-10-CM | POA: Insufficient documentation

## 2016-12-25 DIAGNOSIS — J45909 Unspecified asthma, uncomplicated: Secondary | ICD-10-CM | POA: Insufficient documentation

## 2016-12-25 LAB — CBC
HCT: 36.4 % (ref 36.0–46.0)
Hemoglobin: 12.4 g/dL (ref 12.0–15.0)
MCH: 28.9 pg (ref 26.0–34.0)
MCHC: 34.1 g/dL (ref 30.0–36.0)
MCV: 84.8 fL (ref 78.0–100.0)
Platelets: 271 10*3/uL (ref 150–400)
RBC: 4.29 MIL/uL (ref 3.87–5.11)
RDW: 13.4 % (ref 11.5–15.5)
WBC: 11.9 10*3/uL — ABNORMAL HIGH (ref 4.0–10.5)

## 2016-12-25 LAB — BASIC METABOLIC PANEL
Anion gap: 8 (ref 5–15)
BUN: 7 mg/dL (ref 6–20)
CO2: 23 mmol/L (ref 22–32)
Calcium: 8.8 mg/dL — ABNORMAL LOW (ref 8.9–10.3)
Chloride: 100 mmol/L — ABNORMAL LOW (ref 101–111)
Creatinine, Ser: 0.68 mg/dL (ref 0.44–1.00)
GFR calc Af Amer: >60 mL/min (ref >60)
GFR calc non Af Amer: >60 mL/min (ref >60)
Glucose, Bld: 148 mg/dL — ABNORMAL HIGH (ref 65–99)
Potassium: 3.8 mmol/L (ref 3.5–5.1)
Sodium: 131 mmol/L — ABNORMAL LOW (ref 135–145)

## 2016-12-25 LAB — CBG MONITORING, ED: Glucose-Capillary: 145 mg/dL — ABNORMAL HIGH (ref 65–99)

## 2016-12-25 LAB — I-STAT TROPONIN, ED: Troponin i, poc: 0 ng/mL (ref 0.00–0.08)

## 2016-12-25 MED ORDER — SODIUM CHLORIDE 0.9 % IV BOLUS (SEPSIS)
1000.0000 mL | Freq: Once | INTRAVENOUS | Status: AC
  Administered 2016-12-25: 1000 mL via INTRAVENOUS

## 2016-12-25 NOTE — ED Notes (Signed)
CBG 145  

## 2016-12-25 NOTE — ED Provider Notes (Signed)
[facility] DEPT Provider Note   CSN: [id number] Arrival date & time: 12/25/16  1256     History   Chief Complaint Chief Complaint  Patient presents with  . Near Syncope    HPI Krystal Monroe is a 66 y.o. female.  Patient is a 66 year old female with past medical history of asthma, GERD. She presents today for evaluation of dizziness and near syncope. She reports walking into a restaurant this morning for breakfast, then becoming acutely lightheaded, weak, and diaphoretic. This episode lasted for approximately 20 minutes, then resolved with IV fluids given by EMS. She denies any chest pain, difficulty breathing, headache, or loss of consciousness.  She does report that she was at the dentist office yesterday having dental work done. She stated that this dental work approximately 4-1/2 hours and she has not eaten much due to the dental issues. She now feels fine and is completely symptom-free.    Near Syncope  This is a new problem. The current episode started less than 1 hour ago. The problem occurs constantly. The problem has been resolved. Pertinent negatives include no chest pain and no shortness of breath. Nothing aggravates the symptoms. Nothing relieves the symptoms. She has tried nothing for the symptoms.    Past Medical History:  Diagnosis Date  . Anxiety    son killed in MVA  . Asthma    as a child  . Basal cell carcinoma of nose   . Colon polyps    2011  . Depression    son killed in [place]  . Diverticulosis of colon    diverticulitits  . Gallstones   . GERD (gastroesophageal reflux disease)   . Obesity   . Sleep apnea    pt denies 03-15-16  . Trigeminal neuralgia     Patient Active Problem List   Diagnosis Date Noted  . HBP (high blood pressure) 09/20/2016  . History of colonic polyps 02/02/2016  . Diverticulitis of intestine without perforation or abscess without bleeding 02/02/2016  . Routine general medical examination at a health care facility  10/01/2015  . History of skin cancer 10/01/2015  . Hematuria, microscopic 09/10/2013  . GERD 12/19/2009  . HIATAL HERNIA 12/19/2009  . COLONIC POLYPS, HX OF 12/19/2009  . OSA (obstructive sleep apnea) 12/03/2009  . NEURALGIA, TRIGEMINAL 08/19/2008  . Depression 05/24/2007  . Diverticulosis of large intestine 05/24/2007  . WEIGHT GAIN 05/24/2007    Past Surgical History:  Procedure Laterality Date  . CESAREAN SECTION     x 3  . CHOLECYSTECTOMY  1999  . COLONOSCOPY    . TOTAL ABDOMINAL HYSTERECTOMY W/ BILATERAL SALPINGOOPHORECTOMY    . UPPER GASTROINTESTINAL ENDOSCOPY      OB History    No data available       Home Medications    Prior to Admission medications   Medication Sig Start Date End Date Taking? Authorizing Provider  escitalopram (LEXAPRO) 10 MG tablet TAKE 1 TABLET BY MOUTH DAILY. Patient taking differently: Take 10 mg by mouth daily 10/01/16  Yes Dorena Cookey, MD  HYDROcodone-acetaminophen (NORCO) 7.5-325 MG tablet Take 1 tablet by mouth every 4 (four) hours as needed for moderate pain.  12/22/16  Yes [provider]  ibuprofen (ADVIL,MOTRIN) 200 MG tablet Take 800 mg by mouth every 6 (six) hours as needed for headache or moderate pain.    Yes [provider]  lisinopril-hydrochlorothiazide (PRINZIDE,ZESTORETIC) 10-12.5 MG tablet Take 1 tablet by mouth daily. 09/20/16  Yes [provider]  omeprazole (  PRILOSEC) 40 MG capsule TAKE 1 CAPSULE BY MOUTH DAILY. Patient taking differently: Take 40 mg by mouth daily 10/22/16  Yes Dorena Cookey, MD  Oxcarbazepine (TRILEPTAL) 300 MG tablet TAKE 1 TABLET BY MOUTH 4 TIMES DAILY Patient taking differently: Take 300 mg by mouth 3 times daily 08/23/16  Yes Dorena Cookey, MD  Psyllium (METAMUCIL FIBER PO) Take 1 each by mouth daily.    Yes [provider]  amLODipine (NORVASC) 10 MG tablet Take 1 tablet (10 mg total) by mouth daily. Patient not taking: Reported on 12/25/2016 11/08/16   Dorena Cookey, MD  ciprofloxacin (CIPRO) 500 MG tablet Take 1 tablet (500 mg total) by mouth 2 (two) times daily. Patient not taking: Reported on 09/21/2016 08/30/16   Dorena Cookey, MD  LORazepam (ATIVAN) 1 MG tablet 1/2-1 tablet 1 hour prior to flying Patient not taking: Reported on 09/21/2016 08/30/16   Dorena Cookey, MD  metroNIDAZOLE (FLAGYL) 500 MG tablet 1 by mouth twice a day for diverticulitis Patient not taking: Reported on 09/21/2016 08/30/16   Dorena Cookey, MD    Family History Family History  Problem Relation Age of Onset  . Diabetes Maternal Grandmother   . Crohn's disease Father   . Breast cancer Mother   . Colon cancer Neg Hx   . Esophageal cancer Neg Hx   . Rectal cancer Neg Hx   . Stomach cancer Neg Hx     Social History Social History  Substance Use Topics  . Smoking status: Never Smoker  . Smokeless tobacco: Never Used  . Alcohol use No     Allergies   Other   Review of Systems Review of Systems  Respiratory: Negative for shortness of breath.   Cardiovascular: Positive for near-syncope. Negative for chest pain.  All other systems reviewed and are negative.    Physical Exam Updated Vital Signs BP (!) 114/59 (BP Location: Right Arm)   Pulse 61   Temp 98.3 F (36.8 C) (Oral)   Resp 14   Wt 175 lb (79.4 kg)   SpO2 97%   BMI 31.00 kg/m   Physical Exam  Constitutional: She is oriented to person, place, and time. She appears well-developed and well-nourished. No distress.  HENT:  Head: Normocephalic and atraumatic.  Eyes: EOM are normal. Pupils are equal, round, and reactive to light.  Neck: Normal range of motion. Neck supple.  Cardiovascular: Normal rate and regular rhythm.  Exam reveals no gallop and no friction rub.   No murmur heard. Pulmonary/Chest: Effort normal and breath sounds normal. No respiratory distress. She has no wheezes.  Abdominal: Soft. Bowel sounds are normal. She exhibits no distension. There is no tenderness.    Musculoskeletal: Normal range of motion.  Neurological: She is alert and oriented to person, place, and time. No cranial nerve deficit. She exhibits normal muscle tone. Coordination normal.  Skin: Skin is warm and dry. She is not diaphoretic.  Nursing note and vitals reviewed.    ED Treatments / Results  Labs (all labs ordered are listed, but only abnormal results are displayed) Labs Reviewed  BASIC METABOLIC PANEL - Abnormal; Notable for the following:       Result Value   Sodium 131 (*)    Chloride 100 (*)    Glucose, Bld 148 (*)    Calcium 8.8 (*)    All other components within normal limits  CBC - Abnormal; Notable for the following:    WBC 11.9 (*)  All other components within normal limits  CBG MONITORING, ED - Abnormal; Notable for the following:    Glucose-Capillary 145 (*)    All other components within normal limits  URINALYSIS, ROUTINE W REFLEX MICROSCOPIC    EKG  EKG Interpretation None       Radiology No results found.  Procedures Procedures (including critical care time)  Medications Ordered in ED Medications  sodium chloride 0.9 % bolus 1,000 mL (not administered)     Initial Impression / Assessment and Plan / ED Course  I have reviewed the triage vital signs and the nursing notes.  Pertinent labs & imaging results that were available during my care of the patient were reviewed by me and considered in my medical decision making (see chart for details).  Patient presents here after what sounds like a near syncopal episode, likely related to a combination of precipitating events. She has not eaten or drank much over the past 2 days secondary to having dental work done, and she may have been somewhat dehydrated. Her initial blood pressure was low. There may be some vasovagal component to her episode as well, however now she is back to normal and feeling fine. She was given IV hydration and now has no complaints. There is no evidence for a cardiac  event.  She will be discharged, to return as needed for any problems.  Final Clinical Impressions(s) / ED Diagnoses   Final diagnoses:  None    New Prescriptions New Prescriptions   No medications on file     Veryl Speak, MD 12/25/16 1600

## 2016-12-25 NOTE — Discharge Instructions (Signed)
Drink plenty of fluids and get plenty of rest.  Return to the emergency department if your symptoms significantly worsen or change.

## 2016-12-25 NOTE — ED Notes (Signed)
Pt aware that urine specimen is needed  

## 2016-12-25 NOTE — ED Triage Notes (Addendum)
Pt arrives via EMS after becoming diaphoretic while at lunch and "not feeling right". PT denies pain or SOB. When EMS arrived pt bp 90/-. PT received 1,000 NS bolus with EMS  136/78 Hr- 60 cbg120  #20 LAC

## 2016-12-28 ENCOUNTER — Encounter: Payer: Self-pay | Admitting: Family Medicine

## 2016-12-28 ENCOUNTER — Ambulatory Visit (INDEPENDENT_AMBULATORY_CARE_PROVIDER_SITE_OTHER): Payer: Medicare Other | Admitting: Family Medicine

## 2016-12-28 VITALS — BP 150/80 | Temp 98.6°F | Ht 62.5 in | Wt 177.0 lb

## 2016-12-28 DIAGNOSIS — K2100 Gastro-esophageal reflux disease with esophagitis, without bleeding: Secondary | ICD-10-CM

## 2016-12-28 DIAGNOSIS — G500 Trigeminal neuralgia: Secondary | ICD-10-CM

## 2016-12-28 DIAGNOSIS — E663 Overweight: Secondary | ICD-10-CM

## 2016-12-28 DIAGNOSIS — F329 Major depressive disorder, single episode, unspecified: Secondary | ICD-10-CM

## 2016-12-28 DIAGNOSIS — F32A Depression, unspecified: Secondary | ICD-10-CM

## 2016-12-28 DIAGNOSIS — Z0000 Encounter for general adult medical examination without abnormal findings: Secondary | ICD-10-CM

## 2016-12-28 DIAGNOSIS — I10 Essential (primary) hypertension: Secondary | ICD-10-CM | POA: Diagnosis not present

## 2016-12-28 DIAGNOSIS — Z23 Encounter for immunization: Secondary | ICD-10-CM

## 2016-12-28 LAB — POCT URINALYSIS DIPSTICK
Bilirubin, UA: NEGATIVE
Blood, UA: NEGATIVE
Glucose, UA: NEGATIVE
Ketones, UA: NEGATIVE
Nitrite, UA: NEGATIVE
Protein, UA: NEGATIVE
Spec Grav, UA: >=1.03 — AB (ref 1.010–1.025)
Urobilinogen, UA: 0.2 E.U./dL
pH, UA: 6 (ref 5.0–8.0)

## 2016-12-28 LAB — BASIC METABOLIC PANEL
BUN: 11 mg/dL (ref 6–23)
CO2: 27 mEq/L (ref 19–32)
Calcium: 9.4 mg/dL (ref 8.4–10.5)
Chloride: 104 mEq/L (ref 96–112)
Creatinine, Ser: 0.6 mg/dL (ref 0.40–1.20)
GFR: 105.85 mL/min (ref >60.00)
Glucose, Bld: 94 mg/dL (ref 70–99)
Potassium: 4 mEq/L (ref 3.5–5.1)
Sodium: 138 mEq/L (ref 135–145)

## 2016-12-28 LAB — CBC WITH DIFFERENTIAL/PLATELET
Basophils Absolute: 0 10*3/uL (ref 0.0–0.1)
Basophils Relative: 0.5 % (ref 0.0–3.0)
Eosinophils Absolute: 0.1 10*3/uL (ref 0.0–0.7)
Eosinophils Relative: 1.6 % (ref 0.0–5.0)
HCT: 35.2 % — ABNORMAL LOW (ref 36.0–46.0)
Hemoglobin: 12 g/dL (ref 12.0–15.0)
Lymphocytes Relative: 23 % (ref 12.0–46.0)
Lymphs Abs: 1.6 10*3/uL (ref 0.7–4.0)
MCHC: 34.1 g/dL (ref 30.0–36.0)
MCV: 84.8 fl (ref 78.0–100.0)
Monocytes Absolute: 0.3 10*3/uL (ref 0.1–1.0)
Monocytes Relative: 4.9 % (ref 3.0–12.0)
Neutro Abs: 4.8 10*3/uL (ref 1.4–7.7)
Neutrophils Relative %: 70 % (ref 43.0–77.0)
Platelets: 269 10*3/uL (ref 150.0–400.0)
RBC: 4.16 Mil/uL (ref 3.87–5.11)
RDW: 14.2 % (ref 11.5–15.5)
WBC: 6.9 10*3/uL (ref 4.0–10.5)

## 2016-12-28 LAB — LDL CHOLESTEROL, DIRECT: Direct LDL: 92 mg/dL

## 2016-12-28 LAB — LIPID PANEL
Cholesterol: 181 mg/dL (ref 0–200)
HDL: 34.4 mg/dL — ABNORMAL LOW (ref >39.00)
NonHDL: 146.3
Total CHOL/HDL Ratio: 5
Triglycerides: 295 mg/dL — ABNORMAL HIGH (ref 0.0–149.0)
VLDL: 59 mg/dL — ABNORMAL HIGH (ref 0.0–40.0)

## 2016-12-28 LAB — HEPATIC FUNCTION PANEL
ALT: 13 U/L (ref 0–35)
AST: 12 U/L (ref 0–37)
Albumin: 4.4 g/dL (ref 3.5–5.2)
Alkaline Phosphatase: 51 U/L (ref 39–117)
Bilirubin, Direct: 0.1 mg/dL (ref 0.0–0.3)
Total Bilirubin: 0.4 mg/dL (ref 0.2–1.2)
Total Protein: 6.8 g/dL (ref 6.0–8.3)

## 2016-12-28 LAB — TSH: TSH: 1.29 u[IU]/mL (ref 0.35–4.50)

## 2016-12-28 MED ORDER — OMEPRAZOLE 20 MG PO CPDR: 20.0000 mg | DELAYED_RELEASE_CAPSULE | Freq: Every day | ORAL | 4 refills | Status: DC

## 2016-12-28 MED ORDER — LOSARTAN POTASSIUM-HCTZ 50-12.5 MG PO TABS: 1.0000 | ORAL_TABLET | Freq: Every day | ORAL | 3 refills | Status: DC

## 2016-12-28 MED FILL — LOSARTAN-HCTZ 50-12.5 MG TA: 50-12.5 | 90 days supply | Qty: 90 | Fill #0

## 2016-12-28 MED FILL — OMEPRAZOLE DR 20 MG CAPSULE: 20 | 90 days supply | Qty: 90 | Fill #0
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
## 2016-12-28 NOTE — Patient Instructions (Addendum)
Hyzaar........Marland Kitchen 1 daily in the morning for high blood pressure............ check your blood pressure daily in the morning........ if after 4-6 weeks your blood pressure is not normal or you have any side effects or questions leave me a message on my chart.  Normal blood pressure 135/85 or less........... if after 4-6 weeks her blood pressures normal then continue that dose and just check your blood pressure weekly going forward  I set up a nutrition consult a due in losing weight. Walk 30 minutes daily  Follow-up in one year sooner if any problems  In the future if you feel lightheaded go to ground     Ms. Krystal Monroe , Thank you for taking time to come for your Medicare Wellness Visit. I appreciate your ongoing commitment to your health goals. Please review the following plan we discussed and let me know if I can assist you in the future.   These are the goals we discussed: Goals    . Exercise 150 minutes per week (moderate activity)          Will go to the gym 3 days a week; with weight bearing exercise Cardio Will explore how to meet those goals     . Weight (lb) < 165 lb (74.8 kg)          Will be prepared when going grocery shopping Will plan meals at least 2 weeks of meals Maybe "go - to " meals          This is a list of the screening recommended for you and due dates:  Health Maintenance  Topic Date Due  . Pneumonia vaccines (2 of 2 - PPSV23) 09/30/2016  .  Hepatitis C: One time screening is recommended by Center for Disease Control  (CDC) for  adults born from [birth year] through 1967.   12/27/2017*  . Flu Shot  03/16/2017  . Mammogram  05/31/2018  . Tetanus Vaccine  10/11/2018  . Colon Cancer Screening  03/16/2019  . DEXA scan (bone density measurement)  Completed  *Topic was postponed. The date shown is not the original due date.    Look at osteoporosis.org for more information   Prevention of falls: Remove rugs or any tripping hazards in the home Use Non slip mats in  bathtubs and showers Placing grab bars next to the toilet and or shower Placing handrails on both sides of the stair way Adding extra lighting in the home.   Personal safety issues reviewed:  1. Consider starting a community watch program per [facility] 2.  Changes batteries is smoke detector and/or carbon monoxide detector  3.  If you have firearms; keep them in a safe place 4.  Wear protection when in the sun; Always wear sunscreen or a hat; It is good to have your doctor check your skin annually or review any new areas of concern 5. Driving safety; Keep in the right lane; stay 3 car lengths behind the car in front of you on the highway; look 3 times prior to pulling out; carry your cell phone everywhere you go!    Learn about the Yellow Dot program:  The program allows first responders at your emergency to have access to who your physician is, as well as your medications and medical conditions.  Citizens requesting the Yellow Dot Packages should contact Master Corporal Nunzio Cobbs at the [facility] [phone] for the first week of the program and beginning the week after Easter citizens should contact their local law  enforcement agency.  Health Maintenance, Female Adopting a healthy lifestyle and getting preventive care can go a long way to promote health and wellness. Talk with your health care provider about what schedule of regular examinations is right for you. This is a good chance for you to check in with your provider about disease prevention and staying healthy. In between checkups, there are plenty of things you can do on your own. Experts have done a lot of research about which lifestyle changes and preventive measures are most likely to keep you healthy. Ask your health care provider for more information. Weight and diet Eat a healthy diet  Be sure to include plenty of vegetables, fruits, low-fat dairy products, and lean protein.  Do not  eat a lot of foods high in solid fats, added sugars, or salt.  Get regular exercise. This is one of the most important things you can do for your health.  Most adults should exercise for at least 150 minutes each week. The exercise should increase your heart rate and make you sweat (moderate-intensity exercise).  Most adults should also do strengthening exercises at least twice a week. This is in addition to the moderate-intensity exercise. Maintain a healthy weight  Body mass index (BMI) is a measurement that can be used to identify possible weight problems. It estimates body fat based on height and weight. Your health care provider can help determine your BMI and help you achieve or maintain a healthy weight.  For females 54 years of age and older:  A BMI below 18.5 is considered underweight.  A BMI of 18.5 to 24.9 is normal.  A BMI of 25 to 29.9 is considered overweight.  A BMI of 30 and above is considered obese. Watch levels of cholesterol and blood lipids  You should start having your blood tested for lipids and cholesterol at 68 years of age, then have this test every 5 years.  You may need to have your cholesterol levels checked more often if:  Your lipid or cholesterol levels are high.  You are older than 68 years of age.  You are at high risk for heart disease. Cancer screening Lung Cancer  Lung cancer screening is recommended for adults 63-50 years old who are at high risk for lung cancer because of a history of smoking.  A yearly low-dose CT scan of the lungs is recommended for people who:  Currently smoke.  Have quit within the past 15 years.  Have at least a 30-pack-year history of smoking. A pack year is smoking an average of one pack of cigarettes a day for 1 year.  Yearly screening should continue until it has been 15 years since you quit.  Yearly screening should stop if you develop a health problem that would prevent you from having lung cancer  treatment. Breast Cancer  Practice breast self-awareness. This means understanding how your breasts normally appear and feel.  It also means doing regular breast self-exams. Let your health care provider know about any changes, no matter how small.  If you are in your 20s or 30s, you should have a clinical breast exam (CBE) by a health care provider every 1-3 years as part of a regular health exam.  If you are 5 or older, have a CBE every year. Also consider having a breast X-ray (mammogram) every year.  If you have a family history of breast cancer, talk to your health care provider about genetic screening.  If you are at high risk  for breast cancer, talk to your health care provider about having an MRI and a mammogram every year.  Breast cancer gene (BRCA) assessment is recommended for women who have family members with BRCA-related cancers. BRCA-related cancers include:  Breast.  Ovarian.  Tubal.  Peritoneal cancers.  Results of the assessment will determine the need for genetic counseling and BRCA1 and BRCA2 testing. Cervical Cancer  Your health care provider may recommend that you be screened regularly for cancer of the pelvic organs (ovaries, uterus, and vagina). This screening involves a pelvic examination, including checking for microscopic changes to the surface of your cervix (Pap test). You may be encouraged to have this screening done every 3 years, beginning at age 71.  For women ages 7-65, health care providers may recommend pelvic exams and Pap testing every 3 years, or they may recommend the Pap and pelvic exam, combined with testing for human papilloma virus (HPV), every 5 years. Some types of HPV increase your risk of cervical cancer. Testing for HPV may also be done on women of any age with unclear Pap test results.  Other health care providers may not recommend any screening for nonpregnant women who are considered low risk for pelvic cancer and who do not have  symptoms. Ask your health care provider if a screening pelvic exam is right for you.  If you have had past treatment for cervical cancer or a condition that could lead to cancer, you need Pap tests and screening for cancer for at least 20 years after your treatment. If Pap tests have been discontinued, your risk factors (such as having a new sexual partner) need to be reassessed to determine if screening should resume. Some women have medical problems that increase the chance of getting cervical cancer. In these cases, your health care provider may recommend more frequent screening and Pap tests. Colorectal Cancer  This type of cancer can be detected and often prevented.  Routine colorectal cancer screening usually begins at 68 years of age and continues through 68 years of age.  Your health care provider may recommend screening at an earlier age if you have risk factors for colon cancer.  Your health care provider may also recommend using home test kits to check for hidden blood in the stool.  A small camera at the end of a tube can be used to examine your colon directly (sigmoidoscopy or colonoscopy). This is done to check for the earliest forms of colorectal cancer.  Routine screening usually begins at age 52.  Direct examination of the colon should be repeated every 5-10 years through 68 years of age. However, you may need to be screened more often if early forms of precancerous polyps or small growths are found. Skin Cancer  Check your skin from head to toe regularly.  Tell your health care provider about any new moles or changes in moles, especially if there is a change in a mole's shape or color.  Also tell your health care provider if you have a mole that is larger than the size of a pencil eraser.  Always use sunscreen. Apply sunscreen liberally and repeatedly throughout the day.  Protect yourself by wearing long sleeves, pants, a wide-brimmed hat, and sunglasses whenever you are  outside. Heart disease, diabetes, and high blood pressure  High blood pressure causes heart disease and increases the risk of stroke. High blood pressure is more likely to develop in:  People who have blood pressure in the high end of the normal range (  130-139/85-89 mm Hg).  People who are overweight or obese.  People who are African American.  If you are 42-59 years of age, have your blood pressure checked every 3-5 years. If you are 1 years of age or older, have your blood pressure checked every year. You should have your blood pressure measured twice-once when you are at a hospital or clinic, and once when you are not at a hospital or clinic. Record the average of the two measurements. To check your blood pressure when you are not at a hospital or clinic, you can use:  An automated blood pressure machine at a pharmacy.  A home blood pressure monitor.  If you are between 27 years and 62 years old, ask your health care provider if you should take aspirin to prevent strokes.  Have regular diabetes screenings. This involves taking a blood sample to check your fasting blood sugar level.  If you are at a normal weight and have a low risk for diabetes, have this test once every three years after 68 years of age.  If you are overweight and have a high risk for diabetes, consider being tested at a younger age or more often. Preventing infection Hepatitis B  If you have a higher risk for hepatitis B, you should be screened for this virus. You are considered at high risk for hepatitis B if:  You were born in a country where hepatitis B is common. Ask your health care provider which countries are considered high risk.  Your parents were born in a high-risk country, and you have not been immunized against hepatitis B (hepatitis B vaccine).  You have HIV or AIDS.  You use needles to inject street drugs.  You live with someone who has hepatitis B.  You have had sex with someone who has  hepatitis B.  You get hemodialysis treatment.  You take certain medicines for conditions, including cancer, organ transplantation, and autoimmune conditions. Hepatitis C  Blood testing is recommended for:  Everyone born from [birth year] through 1967.  Anyone with known risk factors for hepatitis C. Sexually transmitted infections (STIs)  You should be screened for sexually transmitted infections (STIs) including gonorrhea and chlamydia if:  You are sexually active and are younger than 68 years of age.  You are older than 68 years of age and your health care provider tells you that you are at risk for this type of infection.  Your sexual activity has changed since you were last screened and you are at an increased risk for chlamydia or gonorrhea. Ask your health care provider if you are at risk.  If you do not have HIV, but are at risk, it may be recommended that you take a prescription medicine daily to prevent HIV infection. This is called pre-exposure prophylaxis (PrEP). You are considered at risk if:  You are sexually active and do not regularly use condoms or know the HIV status of your partner(s).  You take drugs by injection.  You are sexually active with a partner who has HIV. Talk with your health care provider about whether you are at high risk of being infected with HIV. If you choose to begin PrEP, you should first be tested for HIV. You should then be tested every 3 months for as long as you are taking PrEP. Pregnancy  If you are premenopausal and you may become pregnant, ask your health care provider about preconception counseling.  If you may become pregnant, take 400 to 800 micrograms (mcg)  of folic acid every day.  If you want to prevent pregnancy, talk to your health care provider about birth control (contraception). Osteoporosis and menopause  Osteoporosis is a disease in which the bones lose minerals and strength with aging. This can result in serious bone  fractures. Your risk for osteoporosis can be identified using a bone density scan.  If you are 42 years of age or older, or if you are at risk for osteoporosis and fractures, ask your health care provider if you should be screened.  Ask your health care provider whether you should take a calcium or vitamin D supplement to lower your risk for osteoporosis.  Menopause may have certain physical symptoms and risks.  Hormone replacement therapy may reduce some of these symptoms and risks. Talk to your health care provider about whether hormone replacement therapy is right for you. Follow these instructions at home:  Schedule regular health, dental, and eye exams.  Stay current with your immunizations.  Do not use any tobacco products including cigarettes, chewing tobacco, or electronic cigarettes.  If you are pregnant, do not drink alcohol.  If you are breastfeeding, limit how much and how often you drink alcohol.  Limit alcohol intake to no more than 1 drink per day for nonpregnant women. One drink equals 12 ounces of beer, 5 ounces of wine, or 1 ounces of hard liquor.  Do not use street drugs.  Do not share needles.  Ask your health care provider for help if you need support or information about quitting drugs.  Tell your health care provider if you often feel depressed.  Tell your health care provider if you have ever been abused or do not feel safe at home. This information is not intended to replace advice given to you by your health care provider. Make sure you discuss any questions you have with your health care provider. Document Released: 02/15/2011 Document Revised: 01/08/2016 Document Reviewed: 05/06/2015 Elsevier Interactive Patient Education  2017 [employer].   Fall Prevention in the Home Falls can cause injuries and can affect people from all age groups. There are many simple things that you can do to make your home safe and to help prevent falls. What can I do on  the outside of my home?  Regularly repair the edges of walkways and driveways and fix any cracks.  Remove high doorway thresholds.  Trim any shrubbery on the main path into your home.  Use bright outdoor lighting.  Clear walkways of debris and clutter, including tools and rocks.  Regularly check that handrails are securely fastened and in good repair. Both sides of any steps should have handrails.  Install guardrails along the edges of any raised decks or porches.  Have leaves, snow, and ice cleared regularly.  Use sand or salt on walkways during winter months.  In the garage, clean up any spills right away, including grease or oil spills. What can I do in the bathroom?  Use night lights.  Install grab bars by the toilet and in the tub and shower. Do not use towel bars as grab bars.  Use non-skid mats or decals on the floor of the tub or shower.  If you need to sit down while you are in the shower, use a plastic, non-slip stool.  Keep the floor dry. Immediately clean up any water that spills on the floor.  Remove soap buildup in the tub or shower on a regular basis.  Attach bath mats securely with double-sided non-slip rug  tape.  Remove throw rugs and other tripping hazards from the floor. What can I do in the bedroom?  Use night lights.  Make sure that a bedside light is easy to reach.  Do not use oversized bedding that drapes onto the floor.  Have a firm chair that has side arms to use for getting dressed.  Remove throw rugs and other tripping hazards from the floor. What can I do in the kitchen?  Clean up any spills right away.  Avoid walking on wet floors.  Place frequently used items in easy-to-reach places.  If you need to reach for something above you, use a sturdy step stool that has a grab bar.  Keep electrical cables out of the way.  Do not use floor polish or wax that makes floors slippery. If you have to use wax, make sure that it is non-skid  floor wax.  Remove throw rugs and other tripping hazards from the floor. What can I do in the stairways?  Do not leave any items on the stairs.  Make sure that there are handrails on both sides of the stairs. Fix handrails that are broken or loose. Make sure that handrails are as long as the stairways.  Check any carpeting to make sure that it is firmly attached to the stairs. Fix any carpet that is loose or worn.  Avoid having throw rugs at the top or bottom of stairways, or secure the rugs with carpet tape to prevent them from moving.  Make sure that you have a light switch at the top of the stairs and the bottom of the stairs. If you do not have them, have them installed. What are some other fall prevention tips?  Wear closed-toe shoes that fit well and support your feet. Wear shoes that have rubber soles or low heels.  When you use a stepladder, make sure that it is completely opened and that the sides are firmly locked. Have someone hold the ladder while you are using it. Do not climb a closed stepladder.  Add color or contrast paint or tape to grab bars and handrails in your home. Place contrasting color strips on the first and last steps.  Use mobility aids as needed, such as canes, walkers, scooters, and crutches.  Turn on lights if it is dark. Replace any light bulbs that burn out.  Set up furniture so that there are clear paths. Keep the furniture in the same spot.  Fix any uneven floor surfaces.  Choose a carpet design that does not hide the edge of steps of a stairway.  Be aware of any and all pets.  Review your medicines with your healthcare provider. Some medicines can cause dizziness or changes in blood pressure, which increase your risk of falling. Talk with your health care provider about other ways that you can decrease your risk of falls. This may include working with a physical therapist or trainer to improve your strength, balance, and endurance. This  information is not intended to replace advice given to you by your health care provider. Make sure you discuss any questions you have with your health care provider. Document Released: 07/23/2002 Document Revised: 12/30/2015 Document Reviewed: 09/06/2014 Elsevier Interactive Patient Education  2017 [employer].

## 2016-12-28 NOTE — Progress Notes (Signed)
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 We started her on an ACE inhibitor but she developed a cough. We then switched to the Norvasc service she developed severe fatigue. She stop the Norvasc went back the ACE inhibitor. But she has a severe cough like she did before. We discussed various options. We'll switch her to an ARB.  She had a syncopal episode last week. She had to some prolonged dental care and was in a restaurant the next day had abdominal cramps went the bathroom had no bowel movement but would stand up and got very lightheaded. She did not lie down. By the time she got back to her chair in the restaurant she almost passed out. She became very diaphoretic. EMS was called. BP was 90 she was given IV fluids and by the time she got the hospital she was well. Evaluation in March was normal including a normal EKG.  She takes Lexapro 10 mg at bedtime for history of mild depression  She takes Prilosec 40 mg daily but like to decrease the dose to 20  She has a history of trigeminal neuralgia. She had a flare of about 6 months ago and started back on her Trileptal 300 mg 4 times a day  She's had a history of sleep apnea. She's been working with the pulmonary folks. She can't tolerate the device. She knows she needs to lose weight. I recommended she concurs to go the weight loss program at [facility].  She gets routine eye care, dental care, BSE monthly, annual mammography, colonoscopy 2017 showed a polyp. She's due to go back for follow-up in 3-5 years.  She has a history of severe diverticulosis diverticulitis in the past. Currently  asymptomatic.  Vaccinations up-to-date do a shingles vaccine and a Pneumovax. Pneumovax will be given here. Recommend she go the [facility] for the shingles vaccine.  Social history she is married she lives in [place]. She works part-time now on the [employer] development [employer].  BP (!) 150/80 (BP Location: Right Arm)   Temp 98.6 F (37 C) (Oral)   Ht 5' 2.5" (1.588 m)   Wt 177 lb (80.3 kg)   BMI 31.86 kg/m  Examination of the HEENT were negative neck was supple thyroid is nonenlarged no carotid bruits cardiopulmonary exam normal breast exam normal abdominal exam normal pelvic and rectal deferred. Data clear she's had her uterus and ovaries removed for nonmalignant reasons). Extremities normal skin normal peripheral pulses normal except for scar right shoulder where she had rotator cuff surgery. She had this done in November 2017. She still not 100% better. She's not doing her physical therapy at home daily. Encouraged to do that PT at home.  #1 hypertension.......... switch to Hyzaar...Marland KitchenMarland KitchenMarland Kitchen BP check daily...Marland KitchenMarland KitchenMarland Kitchen follow-up in September  #2 overweight........... [facility]  #3 history of mild depression......... continue Lexapro  #4 reflux esophagitis........ decrease Prilosec to 20 mg daily  #5 history of trigeminal neuralgia........ continue Trileptal  #6 sleep apnea........... followed by pulmonary........Marland Kitchen weight loss program as above

## 2016-12-29 ENCOUNTER — Encounter: Payer: Self-pay | Admitting: Family Medicine

## 2017-01-05 ENCOUNTER — Encounter: Payer: Self-pay | Admitting: Pulmonary Disease

## 2017-01-06 MED FILL — ESCITALOPRAM 10 MG TABLET: 10 | 90 days supply | Qty: 90 | Fill #1

## 2017-01-07 ENCOUNTER — Encounter: Payer: Self-pay | Admitting: Pulmonary Disease

## 2017-01-07 ENCOUNTER — Ambulatory Visit (INDEPENDENT_AMBULATORY_CARE_PROVIDER_SITE_OTHER): Payer: Medicare Other | Admitting: Pulmonary Disease

## 2017-01-07 VITALS — BP 122/70 | HR 69 | Ht 62.5 in | Wt 176.8 lb

## 2017-01-07 DIAGNOSIS — G4733 Obstructive sleep apnea (adult) (pediatric): Secondary | ICD-10-CM | POA: Diagnosis not present

## 2017-01-07 NOTE — Progress Notes (Signed)
Subjective:    Patient ID: Krystal Monroe, female    DOB: [birth year], 66 y.o.   MRN: [id number]  HPI   This is the case of Krystal Monroe, 66 y.o. Female, who was referred by Dr. Stevie Kern in consultation regarding OSA.   As you very well know, patient Is a nonsmoker, known to have childhood asthma which has been stable.  Patient was noted to have snoring fatigue in 2013. She ended up seeing his sleep doctor and ended up getting a home sleep study. HST showed AHI of 5. As she was not too symptomatic, CPAP treatment was held off.  She had a right rotator cuff surgery under local and general anesthesia couple weeks ago. No note of desaturation or apneas during that episode. Since that time, she was noted to be hypertensive. Because of concern for hypertension and sleep apnea, she wanted to be checked out again for sleep apnea.  She has some snoring, occasional fatigue. For the most part, she remains functional. She sleeps 5-7 hours per night. Can get sleepy in the afternoon. Naps in the weekend. Hypersomnia can affect her functionality. No abnormal behavior and sleep.  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   ESS 12.   ROV 01/07/2017 Patient returns to the office as follow-up on her sleep apnea. Since last seen, she had a home sleep study done in February which showed an AHI of 12. She was started on CPAP therapy. Download the last month: 23%, AHI 2. She has a nasal mask for which she has difficulties  adjusting to. Her blood pressure has been stable.   Review of Systems  Constitutional: Negative.  Negative for fever and unexpected weight change.  HENT: Positive for sinus pressure. Negative for congestion, dental problem, ear pain, nosebleeds, postnasal drip, rhinorrhea, sneezing, sore throat and trouble swallowing.   Eyes: Negative.  Negative for redness and itching.  Respiratory: Negative.  Negative for cough, chest tightness, shortness of breath and wheezing.     Cardiovascular: Negative.  Negative for palpitations and leg swelling.  Gastrointestinal: Negative.  Negative for nausea and vomiting.  Endocrine: Negative.   Genitourinary: Negative.  Negative for dysuria.  Musculoskeletal: Negative.  Negative for joint swelling.  Skin: Negative.  Negative for rash.  Allergic/Immunologic: Negative.   Neurological: Positive for headaches.  Hematological: Negative.  Does not bruise/bleed easily.  Psychiatric/Behavioral: Negative.  Negative for dysphoric mood. The patient is not nervous/anxious.       Objective:   Physical Exam   Vitals:  Vitals:   01/07/17 1213  BP: 122/70  Pulse: 69  SpO2: 99%  Weight: 176 lb 12.8 oz (80.2 kg)  Height: 5' 2.5" (1.588 m)    Constitutional/General:  Pleasant, well-nourished, well-developed, not in any distress,  Comfortably seating.  Well kempt  Body mass index is 31.82 kg/m. Wt Readings from Last 3 Encounters:  01/07/17 176 lb 12.8 oz (80.2 kg)  12/28/16 177 lb (80.3 kg)  12/25/16 175 lb (79.4 kg)    HEENT: Pupils equal and reactive to light and accommodation. Anicteric sclerae. Normal nasal mucosa.   No oral  lesions,  mouth clear,  oropharynx clear, no postnasal drip. (-) Oral thrush. No dental caries.  Airway - Mallampati class III  Neck: No masses. Midline trachea. No JVD, (-) LAD. (-) bruits appreciated.  Respiratory/Chest: Grossly normal chest. (-) deformity. (-) Accessory muscle use.  Symmetric expansion. (-) Tenderness on palpation.  Resonant on percussion.  Diminished BS on  both lower lung zones. (-) wheezing, crackles, rhonchi (-) egophony  Cardiovascular: Regular rate and  rhythm, heart sounds normal, no murmur or gallops, no peripheral edema  Gastrointestinal:  Normal bowel sounds. Soft, non-tender. No hepatosplenomegaly.  (-) masses.   Musculoskeletal:  Normal muscle tone. Normal gait.   Extremities: Grossly normal. (-) clubbing, cyanosis.  (-) edema  Skin: (-) rash,lesions  seen.   Neurological/Psychiatric : alert, oriented to time, place, person. Normal mood and affect         Assessment & Plan:  OSA (obstructive sleep apnea) Patient was noted to have snoring fatigue in 2013. She ended up seeing his sleep doctor and ended up getting a home sleep study. HST showed AHI of 5. As she was not too symptomatic, CPAP treatment was held off.  She had a right rotator cuff surgery under local and general anesthesia couple weeks ago. No note of desaturation or apneas during that episode. Since that time, she was noted to be hypertensive. Because of concern for hypertension and sleep apnea, she wanted to be checked out again for sleep apnea.  She has some snoring, occasional fatigue. For the most part, she remains functional. She sleeps 5-7 hours per night. Can get sleepy in the afternoon. Naps in the weekend. Hypersomnia can affect her functionality. No abnormal behavior and sleep.  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   ESS 12.   She had a home study in February 2018 which showed an AHI of 12. She was started on an auto CPAP 5-15 centimeters water. She could not tolerate the pressure in the mask. She feels at the end of the day, she was not totally benefiting from it. Initial thought of starting it was because of her blood pressure. Her blood pressures stable on 1 medicine.  Download the last month: 23%, AHI 2. Her P 95 was 7 cm water.   Plan :  We discussed again the implications of untreated sleep apnea. As patient is not too symptomatic and her blood pressure is stable, we decided to hold off on CPAP therapy. She does not necessarily feel the benefit of CPAP therapy. She has nasal pillows which is aggravating her as well. I recommended we try full face mask but she said to hold off for now. If ever she wants to do CPAP again, she will probably be better off on a full face mask and a  the lower pressure. Her download the last month  showed  her P 95 was 7 cm water.     Return to clinic as needed.     Monica Becton, MD 01/07/2017   12:51 PM [facility] Pager: [phone] Office: [phone], Fax: [phone] r

## 2017-01-07 NOTE — Assessment & Plan Note (Signed)
Patient was noted to have snoring fatigue in 2013. She ended up seeing his sleep doctor and ended up getting a home sleep study. HST showed AHI of 5. As she was not too symptomatic, CPAP treatment was held off.  She had a right rotator cuff surgery under local and general anesthesia couple weeks ago. No note of desaturation or apneas during that episode. Since that time, she was noted to be hypertensive. Because of concern for hypertension and sleep apnea, she wanted to be checked out again for sleep apnea.  She has some snoring, occasional fatigue. For the most part, she remains functional. She sleeps 5-7 hours per night. Can get sleepy in the afternoon. Naps in the weekend. Hypersomnia can affect her functionality. No abnormal behavior and sleep.  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   ESS 12.   She had a home study in February 2018 which showed an AHI of 12. She was started on an auto CPAP 5-15 centimeters water. She could not tolerate the pressure in the mask. She feels at the end of the day, she was not totally benefiting from it. Initial thought of starting it was because of her blood pressure. Her blood pressures stable on 1 medicine.  Download the last month: 23%, AHI 2. Her P 95 was 7 cm water.   Plan :  We discussed again the implications of untreated sleep apnea. As patient is not too symptomatic and her blood pressure is stable, we decided to hold off on CPAP therapy. She does not necessarily feel the benefit of CPAP therapy. She has nasal pillows which is aggravating her as well. I recommended we try full face mask but she said to hold off for now. If ever she wants to do CPAP again, she will probably be better off on a full face mask and a  the lower pressure. Her download the last month  showed her P 95 was 7 cm water.

## 2017-01-17 ENCOUNTER — Encounter: Payer: Self-pay | Admitting: Dietician

## 2017-01-17 ENCOUNTER — Encounter: Payer: Medicare Other | Attending: Family Medicine | Admitting: Dietician

## 2017-01-17 DIAGNOSIS — E663 Overweight: Secondary | ICD-10-CM | POA: Diagnosis not present

## 2017-01-17 DIAGNOSIS — Z713 Dietary counseling and surveillance: Secondary | ICD-10-CM | POA: Insufficient documentation

## 2017-01-17 DIAGNOSIS — E669 Obesity, unspecified: Secondary | ICD-10-CM

## 2017-01-17 NOTE — Patient Instructions (Signed)
Stay active Be mindful.  Food choices  When am I satisfied? Rethink your drink.

## 2017-01-17 NOTE — Progress Notes (Signed)
  Medical Nutrition Therapy:  Appt start time: 2841  End time:  3244.   Assessment:  Primary concerns today: Patient is here today with her husband.  They want recommendations for nutrition/lifestyle choices to help them lose weight.  He has type 2 diabetes and she is concerned that she has prediabetes.  Weight today is 175 lbs which has decreased 5 lbs with dental work and minor changes to her eating since January.  They feel that portion sizes are the problem.  Labs noted.  Other hx includes HTN, OSA but not on C-pap as could not tolerate it, GERD and diverticulosis.  Patient lives with her husband.  They both shop and cook.  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    Preferred Learning Style:   No preference indicated   Learning Readiness:   Ready  Change in progress   MEDICATIONS: see lsit   DIETARY INTAKE:  Usual eating pattern includes 3 meals and 1-2 snacks per day. Portion sizes are the issues  24-hr recall:  B ( AM): Peanut butter sandwich on 70 cal [employer]'s killer bread OR boiled egg OR cornbread and tomatoes  Snk ( AM): occasional  L ( PM): ham or [place] or chicken sandwich with cheese, lettuce, tomato, occasional avocado, pickles, mustard, mayo OR salad with tuna OR homemade vegetable soup and sandwich or cornbread or crackers Snk ( PM): cheese and crackers or cheese and apple OR peanuts OR wheat thins D (6 PM): dried beans, slaw OR salmon on salad with vinegar OR pasta OR pasta salad Snk ( PM): similar snacks as afternoon or graham crackers and peanut butter Beverages: sweet tea, Diet Dr. Malachi Bonds or rare regular coke, occasional high pulp OJ, V-8  Usual physical activity: ADL's, cleans house. Keeps 2 1/2 grandson 1 day a week. Water aerobics class is being considered.    Progress Towards Goal(s):  In progress.   Nutritional Diagnosis:  NB-1.1 Food and nutrition-related knowledge deficit As related to healthy eating for weight  loss.  As evidenced by patient report.    Intervention:  Nutrition counseling/education related to healthy eating.  Discussed portion sizes, cooking methods, and mindfulness when eating.  Discussed beverages and other options.  Discussed carbohydrates, protein, fat and balance of these nutrients.  Also discussed meal planning and menu options.  Stay active Be mindful.  Food choices  When am I satisfied? Rethink your drink. Teaching Method Utilized:  Visual Auditory Hands on  Handouts given during visit include:  Snack list  Meal plan card  My plate  Barriers to learning/adherence to lifestyle change: none  Demonstrated degree of understanding via:  Teach Back   Monitoring/Evaluation:  Dietary intake, exercise, and body weight prn.

## 2017-01-21 DIAGNOSIS — H04123 Dry eye syndrome of bilateral lacrimal glands: Secondary | ICD-10-CM | POA: Diagnosis not present

## 2017-01-21 DIAGNOSIS — H2513 Age-related nuclear cataract, bilateral: Secondary | ICD-10-CM | POA: Diagnosis not present

## 2017-01-21 DIAGNOSIS — H5213 Myopia, bilateral: Secondary | ICD-10-CM | POA: Diagnosis not present

## 2017-04-05 MED FILL — LOSARTAN-HCTZ 50-12.5 MG TA: 50-12.5 | 90 days supply | Qty: 90 | Fill #1

## 2017-04-05 MED FILL — ESCITALOPRAM 10 MG TABLET: 10 | 90 days supply | Qty: 90 | Fill #2

## 2017-04-05 MED FILL — OMEPRAZOLE 20 MG CAP: 20 | 90 days supply | Qty: 90 | Fill #1

## 2017-04-21 NOTE — Progress Notes (Signed)
I have reviewed and agree with this note  

## 2017-05-06 ENCOUNTER — Encounter: Payer: Self-pay | Admitting: Family Medicine

## 2017-07-01 DIAGNOSIS — Z803 Family history of malignant neoplasm of breast: Secondary | ICD-10-CM | POA: Diagnosis not present

## 2017-07-01 DIAGNOSIS — Z1231 Encounter for screening mammogram for malignant neoplasm of breast: Secondary | ICD-10-CM | POA: Diagnosis not present

## 2017-07-01 LAB — HM MAMMOGRAPHY

## 2017-07-04 ENCOUNTER — Encounter: Payer: Self-pay | Admitting: Family Medicine

## 2017-07-12 ENCOUNTER — Telehealth: Payer: Medicare Other | Admitting: Family

## 2017-07-12 DIAGNOSIS — J019 Acute sinusitis, unspecified: Secondary | ICD-10-CM

## 2017-07-12 DIAGNOSIS — B9689 Other specified bacterial agents as the cause of diseases classified elsewhere: Secondary | ICD-10-CM

## 2017-07-12 MED ORDER — AMOXICILLIN-POT CLAVULANATE 875-125 MG PO TABS: 1.0000 | ORAL_TABLET | Freq: Two times a day (BID) | ORAL | 0 refills | Status: DC

## 2017-07-12 MED FILL — AMOX-CLAV 875-125 MG TABLET: 875-125 | 7 days supply | Qty: 14 | Fill #0

## 2017-07-12 NOTE — Progress Notes (Signed)

## 2017-07-13 MED FILL — OMEPRAZOLE 20 MG CAP: 20 | 90 days supply | Qty: 90 | Fill #2

## 2017-07-13 MED FILL — LOSARTAN-HCTZ 50-12.5 MG TA: 50-12.5 | 90 days supply | Qty: 90 | Fill #2

## 2017-07-13 MED FILL — ESCITALOPRAM 10 MG TABLET: 10 | 90 days supply | Qty: 90 | Fill #3

## 2017-10-17 ENCOUNTER — Ambulatory Visit (INDEPENDENT_AMBULATORY_CARE_PROVIDER_SITE_OTHER): Payer: Medicare Other | Admitting: Family Medicine

## 2017-10-17 ENCOUNTER — Encounter: Payer: Self-pay | Admitting: Family Medicine

## 2017-10-17 VITALS — BP 126/82 | HR 68 | Temp 98.7°F | Wt 178.0 lb

## 2017-10-17 DIAGNOSIS — I10 Essential (primary) hypertension: Secondary | ICD-10-CM | POA: Diagnosis not present

## 2017-10-17 MED ORDER — TRIAMTERENE-HCTZ 37.5-25 MG PO CAPS: 1.0000 | ORAL_CAPSULE | Freq: Every day | ORAL | 4 refills | Status: DC

## 2017-10-17 MED FILL — TRIAMTERENE/HCTZ 37.5/25 CP: 37.5-25 | 90 days supply | Qty: 90 | Fill #0

## 2017-10-17 NOTE — Patient Instructions (Signed)
Dyazide........Marland Kitchen 1 daily in the morning  Salt free diet  Walk 30 minutes daily  Plain Zyrtec.....Marland Kitchen one shot of steroid nasal spray up each nostril at bedtime  Purchase an Omron pump up digital blood pressure cuff..........Marland Kitchen Krystal Monroe..... Check your blood pressure in the morning and at bedtime. If in 4 weeks your blood pressure is normal...........Marland Kitchen 130/80....... then continue that program. If not 70 a message on my chart and I will call you

## 2017-10-17 NOTE — Progress Notes (Signed)
Krystal Monroe is a 67 year old married female nonsmoker.... 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 Who comes in today for evaluation of hypertension  She's had a long-standing history of hypertension. We recently tried an ACE inhibitor but she developed a cough. We then tried Norvasc but she developed fairly significant peripheral edema. She then tried losartan however she's concerned about the issue of recall of losartan.  She's been monitoring her blood pressure at home. BP by Izora Gala was 126/82 BP by me 140/80. She's been trying to lose weight.  She also has a history of very mild sleep apnea. She tried the apparatus 3 months ago and tolerated.  She's due to come back in May for her annual.  .......... 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  BP 126/82 (BP Location: Left Arm, Patient Position: Sitting, Cuff Size: Normal)   Pulse 68   Temp 98.7 F (37.1 C) (Oral)   Wt 178 lb (80.7 kg)   SpO2 98%   BMI 31.04 kg/m  Well-developed well-nourished female no acute distress vital signs stable she's afebrile cardiopulmonary exam is completely normal. BP 140/80  #1 hypertension......... trial of low-dose Dyazide....... salt free diet...Marland KitchenMarland Kitchen. daily exercise...Marland KitchenMarland KitchenMarland Kitchen BP monitoring at home..... Call in 4 weeks if blood pressure not normal  #2 mild sleep apnea.......... intolerant of the device...Marland KitchenMarland KitchenMarland Kitchen recommend diet exercise and weight loss  #3 mild cough....Marland KitchenMarland Kitchen probably residual from the ACE inhibitor. She's only been off of for about 6 weeks. Explained it may take 3 or 4 months for the cough to go away.

## 2017-10-25 ENCOUNTER — Other Ambulatory Visit: Payer: Self-pay | Admitting: Family Medicine

## 2017-10-25 MED FILL — ESCITALOPRAM 10 MG TABLET: 10 | 90 days supply | Qty: 90 | Fill #0

## 2017-10-25 MED FILL — OMEPRAZOLE 20 MG CAP: 20 | 90 days supply | Qty: 90 | Fill #3

## 2018-01-04 ENCOUNTER — Encounter: Payer: Medicare Other | Admitting: Family Medicine

## 2018-01-23 DIAGNOSIS — H04123 Dry eye syndrome of bilateral lacrimal glands: Secondary | ICD-10-CM | POA: Diagnosis not present

## 2018-01-23 DIAGNOSIS — H524 Presbyopia: Secondary | ICD-10-CM | POA: Diagnosis not present

## 2018-01-23 DIAGNOSIS — H2513 Age-related nuclear cataract, bilateral: Secondary | ICD-10-CM | POA: Diagnosis not present

## 2018-02-06 ENCOUNTER — Other Ambulatory Visit: Payer: Self-pay | Admitting: Family Medicine

## 2018-02-06 DIAGNOSIS — K2100 Gastro-esophageal reflux disease with esophagitis, without bleeding: Secondary | ICD-10-CM

## 2018-02-06 MED FILL — ESCITALOPRAM 10 MG TABLET: 10 | 90 days supply | Qty: 90 | Fill #1

## 2018-02-06 MED FILL — TRIAMTERENE/HCTZ 37.5/25 CP: 37.5-25 | 90 days supply | Qty: 90 | Fill #1

## 2018-02-07 MED FILL — OMEPRAZOLE 20 MG CAP: 20 | 90 days supply | Qty: 90 | Fill #0

## 2018-04-10 ENCOUNTER — Other Ambulatory Visit: Payer: Self-pay | Admitting: Family Medicine

## 2018-04-11 MED FILL — OXcarbazepine 300 MG TABS: 300 | 90 days supply | Qty: 270 | Fill #0

## 2018-04-18 ENCOUNTER — Ambulatory Visit (INDEPENDENT_AMBULATORY_CARE_PROVIDER_SITE_OTHER): Payer: Medicare Other | Admitting: Family Medicine

## 2018-04-18 ENCOUNTER — Encounter: Payer: Self-pay | Admitting: Family Medicine

## 2018-04-18 VITALS — BP 132/70 | HR 68 | Temp 97.5°F | Ht 62.5 in | Wt 179.1 lb

## 2018-04-18 DIAGNOSIS — G500 Trigeminal neuralgia: Secondary | ICD-10-CM | POA: Diagnosis not present

## 2018-04-18 MED ORDER — LORAZEPAM 1 MG PO TABS: ORAL_TABLET | ORAL | 1 refills | Status: DC

## 2018-04-18 MED ORDER — HYDROCODONE-ACETAMINOPHEN 7.5-325 MG PO TABS: 1.0000 | ORAL_TABLET | ORAL | 0 refills | Status: DC | PRN

## 2018-04-18 MED FILL — HYDROCODON-APAP 7.5-325: 7.5-325 | 5 days supply | Qty: 30 | Fill #0

## 2018-04-18 MED FILL — LORazepam 1 MG TABS: 1 | 30 days supply | Qty: 30 | Fill #0

## 2018-04-18 NOTE — Patient Instructions (Addendum)
Trileptal 300 mg.......... 1,,,,,,,,,,,,,3 times daily  Ativan 1 mg............1/2-1 tablet twice a day when necessary  Vicodin 7.5 mg........Marland Kitchen 1/2-1 tablet every 4-6 hours for severe pain.  Reset your physical exam point for next Monday at 3:30

## 2018-04-18 NOTE — Progress Notes (Signed)
Callee is a 67 year old married female nonsmoker who comes in today accompanied by her husband because she can't drive.  She's had a history of trigeminal neuralgia in the past. Past episode was about 3 years ago. This episode started last Thursday. She had been off of her Trileptal but restarted at 300 mg twice a day. Also because his severe pain and insomnia she took a milligram of Ativan and a 7.5 mg hydrocodone tablet. Today her pain is diminished however any time she swallows or touches her face it triggers the pain.  She seen Dr. Erling Cruz  the past but he is retired.  She's also having difficulty with restless leg syndrome. I recommend she discuss that with her neurologist. I would recommend she see Dr. Asencion Partridge Domeier  BP 132/70 (BP Location: Left Arm, Patient Position: Sitting, Cuff Size: Normal)   Pulse 68   Temp (!) 97.5 F (36.4 C) (Oral)   Ht 5' 2.5" (1.588 m)   Wt 179 lb 1 oz (81.2 kg)   SpO2 98%   BMI 32.23 kg/m  Well-developed well-nourished female groggy from medication  No exam done today  #1 trigeminal neuralgia with acute flare.......... Increase Trileptal to 3 or milligrams 3 times a day. Ativan and Vicodin when necessary consult with neurology ASAP Dr. Roddie Mc  #2 restless leg syndrome.........Marland Kitchen Discuss with Dr. Roddie Mc

## 2018-04-24 ENCOUNTER — Encounter: Payer: Self-pay | Admitting: Family Medicine

## 2018-04-24 ENCOUNTER — Ambulatory Visit (INDEPENDENT_AMBULATORY_CARE_PROVIDER_SITE_OTHER): Payer: Medicare Other | Admitting: Family Medicine

## 2018-04-24 VITALS — BP 120/70 | HR 79 | Temp 98.3°F | Ht 63.0 in | Wt 179.4 lb

## 2018-04-24 DIAGNOSIS — I10 Essential (primary) hypertension: Secondary | ICD-10-CM

## 2018-04-24 DIAGNOSIS — F329 Major depressive disorder, single episode, unspecified: Secondary | ICD-10-CM

## 2018-04-24 DIAGNOSIS — K2100 Gastro-esophageal reflux disease with esophagitis, without bleeding: Secondary | ICD-10-CM

## 2018-04-24 DIAGNOSIS — G500 Trigeminal neuralgia: Secondary | ICD-10-CM

## 2018-04-24 DIAGNOSIS — R635 Abnormal weight gain: Secondary | ICD-10-CM | POA: Diagnosis not present

## 2018-04-24 DIAGNOSIS — Z23 Encounter for immunization: Secondary | ICD-10-CM

## 2018-04-24 DIAGNOSIS — F32A Depression, unspecified: Secondary | ICD-10-CM

## 2018-04-24 LAB — POCT URINALYSIS DIPSTICK
Bilirubin, UA: NEGATIVE
Glucose, UA: NEGATIVE
Ketones, UA: NEGATIVE
Leukocytes, UA: NEGATIVE
Nitrite, UA: NEGATIVE
Protein, UA: NEGATIVE
Spec Grav, UA: 1.02 (ref 1.010–1.025)
Urobilinogen, UA: 0.2 E.U./dL
pH, UA: 6 (ref 5.0–8.0)

## 2018-04-24 MED ORDER — TRIAMTERENE-HCTZ 37.5-25 MG PO CAPS: 1.0000 | ORAL_CAPSULE | Freq: Every day | ORAL | 4 refills | Status: DC

## 2018-04-24 MED ORDER — ESCITALOPRAM OXALATE 10 MG PO TABS: 10.0000 mg | ORAL_TABLET | Freq: Every day | ORAL | 4 refills | Status: DC

## 2018-04-24 MED ORDER — OXCARBAZEPINE 300 MG PO TABS: ORAL_TABLET | ORAL | 4 refills | Status: DC

## 2018-04-24 MED ORDER — OMEPRAZOLE 20 MG PO CPDR: 20.0000 mg | DELAYED_RELEASE_CAPSULE | Freq: Every day | ORAL | 4 refills | Status: DC

## 2018-04-24 NOTE — Patient Instructions (Signed)
Labs today......... I will call you if there is anything abnormal  Continue current meds  Follow-up in one year sooner if any problems  Begin a walking program......... 30 minutes daily.......Marland Kitchen Avoid sugar

## 2018-04-24 NOTE — Progress Notes (Signed)
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 Who comes in today for evaluation the following issues  She has a history of mild depression she's been on Lexapro 10 mg daily for many years. Her son dried and a auto accident many years ago.  She takes Prilosec 20 mg daily because of a history of chronic reflux esophagitis She takes Dyazide 30 7. 525 daily for hypertension. BP 1:30 120/70  She's on Trileptal 3 or milligrams 3 times a day. She had a severe flare of her trigeminal neuralgia last week. After couple days a Trileptal her symptoms have abated. We discussed strategies including surgery versus continue medication. The patient would like to continue the medication for now.  She gets routine eye care...Marland KitchenMarland KitchenMarland Kitchen Told she has early bilateral cataracts... Regular dental care, BSE monthly, and you mammography, colonoscopy 2017. She was told to come every 3 years because she has a history of diverticulosis.  Vaccinations up-to-date information given on the new shingles vaccine. Seasonal flu shot given today. She had her uterus and ovaries removed many years ago for nonmalignant reasons. She is on HRT for 10 years and stopped. She's relatively asymptomatic For some minor hot flushes. Discussed estrogen supplementation she declines  14 point review of systems otherwise negative  Cognitive function normal she does not exercise daily home health safety reviewed no issues identified, no guns in the house, she does have a healthcare power of attorney and living well  BP 120/70 (BP Location: Right Arm, Patient Position: Sitting, Cuff Size: Normal)   Pulse 79   Temp 98.3 F (36.8 C) (Oral)   Ht 5\' 3"  (1.6 m)   Wt 179 lb 6 oz (81.4 kg)   SpO2 97%   BMI 31.77 kg/m  Well-developed well-nourished slightly overweight female no acute distress vital signs stable she's afebrile HEENT were negative,,,,,,,, except  for early bilateral cataracts. Neck was supple thyroid not enlarged no carotid bruits. Cardiopulmonary exam normal. Breast exam normal. Abdominal exam normal. Pelvic and rectal not indicated extremities normal skin normal peripheral pulses normal. Scar noted from previous Mohs surgery.  #1 healthy female  #2 history of mild depression....... Continue Lexapro  #3 reflux esophagitis........ Continue Prilosec  #4 hypertension.......... Continue Dyazide  #5 trigeminal neuralgia........... Continue the Trileptal 300 mg 3 times a day  #6 overweight.......Marland Kitchen Discuss carbohydrate free diet 30 minutes of walking daily

## 2018-04-25 LAB — HEPATIC FUNCTION PANEL
ALT: 16 U/L (ref 0–35)
AST: 13 U/L (ref 0–37)
Albumin: 4.5 g/dL (ref 3.5–5.2)
Alkaline Phosphatase: 55 U/L (ref 39–117)
Bilirubin, Direct: 0.1 mg/dL (ref 0.0–0.3)
Total Bilirubin: 0.4 mg/dL (ref 0.2–1.2)
Total Protein: 7.4 g/dL (ref 6.0–8.3)

## 2018-04-25 LAB — CBC WITH DIFFERENTIAL/PLATELET
Basophils Absolute: 0 10*3/uL (ref 0.0–0.1)
Basophils Relative: 0.6 % (ref 0.0–3.0)
Eosinophils Absolute: 0.1 10*3/uL (ref 0.0–0.7)
Eosinophils Relative: 1.2 % (ref 0.0–5.0)
HCT: 37.6 % (ref 36.0–46.0)
Hemoglobin: 12.8 g/dL (ref 12.0–15.0)
Lymphocytes Relative: 14.3 % (ref 12.0–46.0)
Lymphs Abs: 1.2 10*3/uL (ref 0.7–4.0)
MCHC: 34 g/dL (ref 30.0–36.0)
MCV: 82.7 fl (ref 78.0–100.0)
Monocytes Absolute: 0.4 10*3/uL (ref 0.1–1.0)
Monocytes Relative: 4.6 % (ref 3.0–12.0)
Neutro Abs: 6.8 10*3/uL (ref 1.4–7.7)
Neutrophils Relative %: 79.3 % — ABNORMAL HIGH (ref 43.0–77.0)
Platelets: 258 10*3/uL (ref 150.0–400.0)
RBC: 4.55 Mil/uL (ref 3.87–5.11)
RDW: 13.7 % (ref 11.5–15.5)
WBC: 8.6 10*3/uL (ref 4.0–10.5)

## 2018-04-25 LAB — BASIC METABOLIC PANEL
BUN: 9 mg/dL (ref 6–23)
CO2: 24 mEq/L (ref 19–32)
Calcium: 9.8 mg/dL (ref 8.4–10.5)
Chloride: 93 mEq/L — ABNORMAL LOW (ref 96–112)
Creatinine, Ser: 0.59 mg/dL (ref 0.40–1.20)
GFR: 107.5 mL/min (ref >60.00)
Glucose, Bld: 119 mg/dL — ABNORMAL HIGH (ref 70–99)
Potassium: 3.7 mEq/L (ref 3.5–5.1)
Sodium: 127 mEq/L — ABNORMAL LOW (ref 135–145)

## 2018-04-25 LAB — LDL CHOLESTEROL, DIRECT: Direct LDL: 118 mg/dL

## 2018-04-25 LAB — LIPID PANEL
Cholesterol: 183 mg/dL (ref 0–200)
HDL: 49.6 mg/dL (ref >39.00)
NonHDL: 133.65
Total CHOL/HDL Ratio: 4
Triglycerides: 243 mg/dL — ABNORMAL HIGH (ref 0.0–149.0)
VLDL: 48.6 mg/dL — ABNORMAL HIGH (ref 0.0–40.0)

## 2018-04-25 LAB — TSH: TSH: 1.91 u[IU]/mL (ref 0.35–4.50)

## 2018-04-26 ENCOUNTER — Other Ambulatory Visit: Payer: Self-pay | Admitting: Family Medicine

## 2018-04-26 DIAGNOSIS — E871 Hypo-osmolality and hyponatremia: Secondary | ICD-10-CM

## 2018-04-26 NOTE — Progress Notes (Signed)
serum

## 2018-05-15 ENCOUNTER — Other Ambulatory Visit (INDEPENDENT_AMBULATORY_CARE_PROVIDER_SITE_OTHER): Payer: Medicare Other

## 2018-05-15 DIAGNOSIS — E871 Hypo-osmolality and hyponatremia: Secondary | ICD-10-CM | POA: Diagnosis not present

## 2018-05-15 LAB — SODIUM: Sodium: 131 mEq/L — ABNORMAL LOW (ref 135–145)

## 2018-05-22 ENCOUNTER — Ambulatory Visit (INDEPENDENT_AMBULATORY_CARE_PROVIDER_SITE_OTHER): Payer: Medicare Other

## 2018-05-22 ENCOUNTER — Encounter: Payer: Self-pay | Admitting: Family Medicine

## 2018-05-22 ENCOUNTER — Ambulatory Visit (INDEPENDENT_AMBULATORY_CARE_PROVIDER_SITE_OTHER): Payer: Medicare Other | Admitting: Family Medicine

## 2018-05-22 VITALS — BP 160/80 | HR 80 | Temp 97.7°F | Wt 179.0 lb

## 2018-05-22 DIAGNOSIS — J189 Pneumonia, unspecified organism: Secondary | ICD-10-CM

## 2018-05-22 DIAGNOSIS — J181 Lobar pneumonia, unspecified organism: Secondary | ICD-10-CM | POA: Diagnosis not present

## 2018-05-22 DIAGNOSIS — R05 Cough: Secondary | ICD-10-CM | POA: Diagnosis not present

## 2018-05-22 MED ORDER — HYDROCODONE-HOMATROPINE 5-1.5 MG/5ML PO SYRP: 5.0000 mL | ORAL_SOLUTION | Freq: Three times a day (TID) | ORAL | 0 refills | Status: DC | PRN

## 2018-05-22 MED ORDER — DOXYCYCLINE HYCLATE 100 MG PO TABS: 100.0000 mg | ORAL_TABLET | Freq: Two times a day (BID) | ORAL | 0 refills | Status: DC

## 2018-05-22 MED FILL — DOXYCYCLINE HYCLATE 100 MG: 100 | 10 days supply | Qty: 20 | Fill #0

## 2018-05-22 MED FILL — HYDROCODONE-HOMATROPINE SOL: 5-1.5 | 16 days supply | Qty: 240 | Fill #0

## 2018-05-22 NOTE — Patient Instructions (Signed)
Rest at home  Doxycycline 100 mg twice daily  Hydromet 1/2 to 1 teaspoon 3 times daily as needed for cough  Drink lots of water  Continue your normal meds  Follow-up next Monday.  Sooner if any problems

## 2018-05-22 NOTE — Progress Notes (Signed)
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  No fever.  She does have some yellow-green sputum.  She also feels short of breath.  No earache sore throat nausea vomiting or diarrhea.  She has slight hyponatremia from her Trileptal.  She tried to stop the Trileptal but had another attack of the trigeminal neuralgia.  I explained to her that the low serum sodium is not low enough to stop any of her medications.  BP (!) 160/80 (BP Location: Right Arm, Patient Position: Sitting, Cuff Size: Large)   Pulse 80   Temp 97.7 F (36.5 C) (Oral)   Wt 179 lb (81.2 kg)   SpO2 96%   BMI 31.71 kg/m  Well-developed well-nourished female no acute distress vital signs stable she is afebrile HEENT were negative neck was supple no adenopathy lungs shows crackles right base   Chest x-ray///////////// shows linear streaking right lower lobe consistent with a right lower lobe pneumonia 1.  Right lower lobe pneumonia probable mycoplasma......... doxycycline 100 mg twice daily x10 days and Hydromet cough syrup

## 2018-05-29 ENCOUNTER — Encounter: Payer: Self-pay | Admitting: Family Medicine

## 2018-05-29 ENCOUNTER — Ambulatory Visit (INDEPENDENT_AMBULATORY_CARE_PROVIDER_SITE_OTHER): Payer: Medicare Other | Admitting: Family Medicine

## 2018-05-29 VITALS — BP 132/68 | HR 76 | Temp 97.8°F | Wt 176.6 lb

## 2018-05-29 DIAGNOSIS — J189 Pneumonia, unspecified organism: Secondary | ICD-10-CM | POA: Insufficient documentation

## 2018-05-29 DIAGNOSIS — J181 Lobar pneumonia, unspecified organism: Secondary | ICD-10-CM

## 2018-05-29 MED FILL — ESCITALOPRAM 10 MG TABLET: 10 | 90 days supply | Qty: 90 | Fill #2

## 2018-05-29 MED FILL — OMEPRAZOLE 20 MG CPDR: 20 | 90 days supply | Qty: 90 | Fill #1

## 2018-05-29 MED FILL — TRIAMTERENE/HCTZ 37.5/25 CP: 37.5-25 | 90 days supply | Qty: 90 | Fill #2

## 2018-05-29 NOTE — Patient Instructions (Signed)
Finish your medication  Call as needed

## 2018-05-29 NOTE — Progress Notes (Signed)
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  She had a viral syndrome the cough got worse.  On physical examination she had crackles at her left base.  Chest x-ray showed some streaking but no definitive infiltrate.  She was treated with doxycycline 100 mg twice daily and hydrocodone cough syrup.  She comes in today for follow-up.  She says she feels much better.  She is able to sleep all night not waking up coughing.  No side effects from medication  BP 132/68 (BP Location: Right Arm, Patient Position: Sitting, Cuff Size: Large)   Pulse 76   Temp 97.8 F (36.6 C) (Oral)   Wt 176 lb 9.6 oz (80.1 kg)   SpO2 99%   BMI 31.28 kg/m  Well-developed well-nourished female no acute distress vital signs stable she is afebrile pulmonary exam shows lungs are clear crackles are gone  1.  Left lower lobe pneumonia......... community-acquired...........Marland Kitchen resolving with medication

## 2018-06-15 ENCOUNTER — Ambulatory Visit (INDEPENDENT_AMBULATORY_CARE_PROVIDER_SITE_OTHER): Payer: Medicare Other | Admitting: Neurology

## 2018-06-15 ENCOUNTER — Encounter: Payer: Self-pay | Admitting: Neurology

## 2018-06-15 VITALS — BP 140/80 | HR 73 | Ht 63.0 in | Wt 178.0 lb

## 2018-06-15 DIAGNOSIS — E6609 Other obesity due to excess calories: Secondary | ICD-10-CM

## 2018-06-15 DIAGNOSIS — G2581 Restless legs syndrome: Secondary | ICD-10-CM | POA: Diagnosis not present

## 2018-06-15 DIAGNOSIS — Z6831 Body mass index (BMI) 31.0-31.9, adult: Secondary | ICD-10-CM | POA: Diagnosis not present

## 2018-06-15 DIAGNOSIS — G4719 Other hypersomnia: Secondary | ICD-10-CM | POA: Insufficient documentation

## 2018-06-15 DIAGNOSIS — R0683 Snoring: Secondary | ICD-10-CM

## 2018-06-15 DIAGNOSIS — Z5181 Encounter for therapeutic drug level monitoring: Secondary | ICD-10-CM | POA: Diagnosis not present

## 2018-06-15 DIAGNOSIS — J189 Pneumonia, unspecified organism: Secondary | ICD-10-CM | POA: Diagnosis not present

## 2018-06-15 DIAGNOSIS — G500 Trigeminal neuralgia: Secondary | ICD-10-CM | POA: Insufficient documentation

## 2018-06-15 NOTE — Progress Notes (Signed)
SLEEP MEDICINE CLINIC   Provider:  Larey Seat, M D  Primary Care Physician:  Dorena Cookey, MD   Referring Provider: Dorena Cookey, MD /   Chief Complaint  Patient presents with  . 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 Sherren Mocha for transfer and continuity of sleep care.   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 She has a history of trigeminal Neuralgia since 2012 , following an oral surgery. She has seen Dr. Erling Cruz and did well on trileptal - 300 mg tid po. She was for 2 years pain free and able to wean. In September she had a severe flair up, was unable to function, to drive, and restarted Trileptol.   She laid in bed for 5 days- took hydrocodone and Advil - and waited for the medication to start working.  Last Friday she had finally a day off pain and she is now able to see me here. She has still hyperesthesia. Dr. Erling Cruz had done 2 MRIs . She never was interested in gamma knife surgery.   2)Dr. Sherren Mocha also reported her RLS to be worked up here in the future.  She had not taken any medications.   3) She also suffers from seasonal allergies, tends to have bronchitis and coughing during the season and she developed pneumonia this year for the first time.  She saw Dr. Sherren Mocha for treatment and is on the way up, finally can sleep and breath. She suspects she has sleep apnea. She  Had a HST 3 years ago at [employer] and felt it was not a valid test.  She couldn't sleep the night of the test. She snores horribly,   Chief complaint according to patient :  For today , it's trigeminal neuralgia - plan for the future.   Sleep habits are as follows: Dinnertime is usually between 5 and 6 PM, the patient will spend the evening hours before bedtime doing yard work, household chores, and she may read or watch TV later.  Her bedtime is around 9 PM and she feels very  tired already by that time.  She sleeps in a separate bedroom from her husband because of her loud snoring.  She does watch TV in her bedroom otherwise the bedroom would be cool, quiet and dark.  The TV however stays on most of the night. She wakes up frequently moves, tosses and turns she feels too hot in the bed but she reports that she does not have nocturia or at least no more than once at night.  Most recently she had a lot of trouble with coughing and she is just in the process of overcoming pneumonia.  She had taken some hydrocodone which also helps pain but works as a cough suppressant.  In these days she has woken up unrefreshed and unrestored.  He has a dry mouth in the morning, a dull more generalized throbbing headache that she wakes up with but not wake up from. Takes 2 excedrin in AM.  Naps when she can- after lunch naps last never more than 1 hour and make her even more groggy,    Sleep medical history and family sleep history:  No known family members with OSA, mother had dementia and passed at age 43, lung cancer , chain smoking, killed her  father.  Brother is healthy.   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  Patient has never smoked herself but was exposed to passive smoke growing up. She is also not an alcohol consumer, caffeine use coffee drinks iced tea and soda but not coffee.  Up to 3 caffeinated beverages a day. She works as a Marine scientist at [employer] but never had a night shift duty.  Review of Systems: Out of a complete 14 system review, the patient complains of only the following symptoms, and all other reviewed systems are negative.  trigeminal pain ? From the auricular region to the upper right incisor.    Social History   Socioeconomic History  . Marital status: Married    Spouse name: Not on file  . Number of children: 3  . Years of education: BSN  . Highest education level: Bachelor's degree (e.g., BA, AB, BS)    Occupational History  . Occupation: Therapist, sports- [employer]    Comment: retired  Scientific laboratory technician  . Financial resource strain: Not on file  . Food insecurity:    Worry: Not on file    Inability: Not on file  . Transportation needs:    Medical: Not on file    Non-medical: Not on file  Tobacco Use  . Smoking status: Never Smoker  . Smokeless tobacco: Never Used  Substance and Sexual Activity  . Alcohol use: No    Alcohol/week: 0.0 standard drinks  . Drug use: No  . Sexual activity: Not on file  Lifestyle  . Physical activity:    Days per week: Not on file    Minutes per session: Not on file  . Stress: Not on file  Relationships  . Social connections:    Talks on phone: Not on file    Gets together: Not on file    Attends religious service: Not on file    Active member of club or organization: Not on file    Attends meetings of clubs or organizations: Not on file    Relationship status: Not on file  . Intimate partner violence:    Fear of current or ex partner: Not on file    Emotionally abused: Not on file    Physically abused: Not on file    Forced sexual activity: Not on file  Other Topics Concern  . Not on file  Social History Narrative   Lives with husband    caffeine 2 a day    Family History  Problem Relation Age of Onset  . Diabetes Maternal Grandmother   . Crohn's disease Father   . Lung cancer Father   . Breast cancer Mother   . Osteoporosis Mother   . Dementia Mother   . Diabetes Mother   . Hypertension Mother   . Colon cancer Neg Hx   . Esophageal cancer Neg Hx   . Rectal cancer Neg Hx   . Stomach cancer Neg Hx     Past Medical History:  Diagnosis Date  . Anxiety    son killed in MVA  . Asthma    as a child  . Basal cell carcinoma of nose   . Colon polyps    2011  . Depression    son killed in [place]  . Diverticulosis of colon    diverticulitits  . Gallstones   . GERD (gastroesophageal reflux disease)   . Hypertension   . Obesity   . Sleep apnea     pt denies 03-15-16  .  Trigeminal neuralgia     Past Surgical History:  Procedure Laterality Date  . [place]   x 3  . CHOLECYSTECTOMY  2002  . COLONOSCOPY    . ROTATOR CUFF REPAIR Right 2017  . TOTAL ABDOMINAL HYSTERECTOMY W/ BILATERAL SALPINGOOPHORECTOMY  2001  . UPPER GASTROINTESTINAL ENDOSCOPY      Current Outpatient Medications  Medication Sig Dispense Refill  . escitalopram (LEXAPRO) 10 MG tablet Take 1 tablet (10 mg total) by mouth daily. 100 tablet 4  . omeprazole (PRILOSEC) 20 MG capsule Take 1 capsule (20 mg total) by mouth daily. 100 capsule 4  . Oxcarbazepine (TRILEPTAL) 300 MG tablet Take 300 mg by mouth 3 times daily 360 tablet 4  . triamterene-hydrochlorothiazide (DYAZIDE) 37.5-25 MG capsule Take 1 each (1 capsule total) by mouth daily. 100 capsule 4  . ciprofloxacin (CIPRO) 500 MG tablet Take 500 mg by mouth 2 (two) times daily.    Marland Kitchen HYDROcodone-acetaminophen (NORCO) 7.5-325 MG tablet Take 1 tablet by mouth every 4 (four) hours as needed for moderate pain. (Patient not taking: Reported on 06/15/2018) 30 tablet 0  . HYDROcodone-homatropine (HYCODAN) 5-1.5 MG/5ML syrup Take 5 mLs by mouth every 8 (eight) hours as needed. (Patient not taking: Reported on 06/15/2018) 240 mL 0  . ibuprofen (ADVIL,MOTRIN) 200 MG tablet Take 800 mg by mouth every 6 (six) hours as needed for headache or moderate pain.     Marland Kitchen LORazepam (ATIVAN) 1 MG tablet 1/2-1 tablet 1 hour prior to flying (Patient not taking: Reported on 06/15/2018) 30 tablet 1  . metroNIDAZOLE (FLAGYL) 500 MG tablet 1 by mouth twice a day for diverticulitis (Patient not taking: Reported on 06/15/2018) 20 tablet 1  . Psyllium (METAMUCIL FIBER PO) Take 1 each by mouth daily.      Current Facility-Administered Medications  Medication Dose Route Frequency Provider Last Rate Last Dose  . 0.9 %  sodium chloride infusion  500 mL Intravenous Continuous Mauri Pole, MD        Allergies as of  06/15/2018 - Review Complete 06/15/2018  Allergen Reaction Noted  . Other Hives, Rash, and Other (See Comments) 12/01/2012    Vitals: BP 140/80   Pulse 73   Ht 5\' 3"  (1.6 m)   Wt 178 lb (80.7 kg)   BMI 31.53 kg/m  Last Weight:  Wt Readings from Last 1 Encounters:  06/15/18 178 lb (80.7 kg)   BJY:NWGN mass index is 31.53 kg/m.     Last Height:   Ht Readings from Last 1 Encounters:  06/15/18 5\' 3"  (1.6 m)    Physical exam:  General: The patient is awake, alert and appears not in acute distress. The patient is well groomed. Head: Normocephalic, atraumatic. Neck is supple. Mallampati 4 ,  neck circumference:15.. Nasal airflow patent ,Retrognathia is not seen.  Cardiovascular:  Regular rate and rhythm  without  murmurs or carotid bruit, and without distended neck veins. Respiratory: Lungs are clear to auscultation. Skin:  Without evidence of edema, or rash Trunk: BMI is elevated . The patient's posture is erect  Neurologic exam : The patient is awake and alert, oriented to place and time.   Memory subjective described as intact.   Attention span & concentration ability appears normal.  Speech is fluent,  without dysarthria, dysphonia or aphasia.  Mood and affect are appropriate.  Cranial nerves:  taste and smell are not affected. Pupils are equal and briskly reactive to light. Funduscopic exam without evidence of pallor or  edema. Extraocular movements  in vertical and horizontal planes intact and without nystagmus. Visual fields by finger perimetry are intact. Hearing to finger rub intact.  Facial sensation hypersensitive to fine touch.  From the right preauricular region towards the upper right incisor. Facial motor strength is symmetric and tongue and uvula move midline.  Shoulder shrug was symmetrical.   Motor exam:   Normal tone, muscle bulk and symmetric strength in all extremities. Sensory:  Fine touch, pinprick and vibration were tested in all extremities. Proprioception  tested in the upper extremities was normal. Coordination: Rapid alternating movements in the fingers/hands was normal. Finger-to-nose maneuver  normal without evidence of ataxia, dysmetria or tremor. Gait and station: Patient walks without assistive device and is able unassisted to climb up to the exam table. Strength within normal limits.  Stance is stable and normal.  Turns with  4 Steps. Romberg testing is negative.  Deep tendon reflexes: in the  upper and lower extremities are symmetric and intact. Babinski maneuver response is downgoing.    Assessment:  After physical and neurologic examination, review of laboratory studies,  Personal review of imaging studies, reports of other /same  Imaging studies, results of polysomnography and / or neurophysiology testing and pre-existing records as far as provided in visit., my assessment is   1) there seems to be clearly an involvement of the mentalis branch of the right face connecting skin sensation from the right preauricular region towards the upper right incisor.  The onset of this pain followed right after some dental work.  She was pain-free after being treated with Trileptal and felt confident to wean off she states been pain-free for 2 to 3 years and then had a sudden relapse just in September.  Follow-up will maintain her on the Trileptal I will have to follow her white blood cell count for leukopenia and her sodium level.  I would like for her to consider a gamma knife surgery in the future, I think it would be beneficial for her to have a consult and meet somebody who would perform this procedure.  I also like for her to undergo a special sinus CT for the right maxillary sinus to make sure that the dental work did not penetrate the bottom of the maxillary bony border.   2) RLS- will address at a later date.   3) OSA- snoring, will order attended sleep study - attended sleep study in a patient with recent pneumonia, with respiratory dysfunction,  and on pain medication for neuralgia.    The patient was advised of the nature of the diagnosed disorder , the treatment options and the  risks for general health and wellness arising from not treating the condition.   I spent more than 60 minutes of face to face time with the patient.  Greater than 50% of time was spent in counseling and coordination of care. We have discussed the diagnosis and differential and I answered the patient's questions.    Plan:  Treatment plan and additional workup :  SPLIT night PSG , ferritin, TBC and BMET, CBC diff.   Larey Seat, MD [id number], 1:02 PM  Certified in Neurology by ABPN Certified in [place] by [facility] [address], [place], [place] [address]

## 2018-06-16 LAB — BASIC METABOLIC PANEL WITH GFR
BUN/Creatinine Ratio: 17 (ref 12–28)
BUN: 10 mg/dL (ref 8–27)
CO2: 21 mmol/L (ref 20–29)
Calcium: 10.1 mg/dL (ref 8.7–10.3)
Chloride: 84 mmol/L — ABNORMAL LOW (ref 96–106)
Creatinine, Ser: 0.59 mg/dL (ref 0.57–1.00)
GFR calc Af Amer: 109 mL/min/1.73
GFR calc non Af Amer: 94 mL/min/1.73
Glucose: 99 mg/dL (ref 65–99)
Potassium: 4.3 mmol/L (ref 3.5–5.2)
Sodium: 125 mmol/L — ABNORMAL LOW (ref 134–144)

## 2018-06-16 LAB — CBC
Hematocrit: 37 % (ref 34.0–46.6)
Hemoglobin: 12.7 g/dL (ref 11.1–15.9)
MCH: 28.7 pg (ref 26.6–33.0)
MCHC: 34.3 g/dL (ref 31.5–35.7)
MCV: 84 fL (ref 79–97)
Platelets: 287 10*3/uL (ref 150–450)
RBC: 4.42 x10E6/uL (ref 3.77–5.28)
RDW: 12.8 % (ref 12.3–15.4)
WBC: 9.6 10*3/uL (ref 3.4–10.8)

## 2018-06-19 ENCOUNTER — Telehealth: Payer: Self-pay | Admitting: Neurology

## 2018-06-19 ENCOUNTER — Other Ambulatory Visit: Payer: Self-pay | Admitting: Neurology

## 2018-06-19 DIAGNOSIS — G500 Trigeminal neuralgia: Secondary | ICD-10-CM

## 2018-06-19 MED ORDER — LAMOTRIGINE 25 MG PO TABS: ORAL_TABLET | ORAL | 3 refills | Status: DC

## 2018-06-19 MED FILL — lamoTRIgine 25 MG TABS: 25 | 40 days supply | Qty: 240 | Fill #0

## 2018-06-19 NOTE — Addendum Note (Signed)
Addended by: Larey Seat on: 06/19/2018 01:17 PM   Modules accepted: Orders

## 2018-06-19 NOTE — Telephone Encounter (Signed)
Called the patient and discussed the lab results with her. Informed her that Dr Brett Fairy would like to change her medication to Lamictal from Trileptal. I will inquire the dosage with Dr Brett Fairy and informed her that we will send the script to [facility] for her. Pt verbalized understanding. Patient mentioned Dr Brett Fairy wanted to completed a CT of sinus area. I informed her I didn't see this ordered for her yet but that I would reach out to Dr Brett Fairy and see if she still wanted it. The patient is also interested in the referral for the gamma knife procedure. Informed her that I will make Dr Dohmeier aware of all of this so the appropriate orders can be placed and then we will send them to where they need to go and the patient should be on a lookout from phone calls. Pt verbalized understanding and was appreciative for the call.

## 2018-06-19 NOTE — Telephone Encounter (Signed)
-----   Message from Larey Seat, MD sent at 06/19/2018  8:22 AM EST ----- Severe hyponatremia- low chloride. This can be related to Oxcarbazepine- we should change the medication to Lamictal.   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 I will also recommend to refer for gamma knife procedure.

## 2018-06-19 NOTE — Telephone Encounter (Signed)
06/19/18 [place]/mutual of [place] order sent to GI lvm for pt to be aware. No auth I also left their number of [phone] and to give them a call if she has not heard in the next 2-3 business days.

## 2018-06-22 ENCOUNTER — Other Ambulatory Visit: Payer: Self-pay | Admitting: Neurology

## 2018-06-22 DIAGNOSIS — G500 Trigeminal neuralgia: Secondary | ICD-10-CM

## 2018-06-22 NOTE — Telephone Encounter (Signed)
Eusebio Me with [facility] called and per the Tech's if you are looking for sinus reasoning the CT should be either CT w contrast or CT wo contrast not both or if you are looking for the trigeminal it needs tob e a MRI trigeminall w/wo contrast.

## 2018-06-22 NOTE — Telephone Encounter (Signed)
Replaced the order maxiofacial CT with contrast for the pt since she is looking for there. I posted that in comments of the order. I didn't know what to actually select for reason for exam but chose trigimenal neuralgia because that is diagnosis code.

## 2018-06-22 NOTE — Telephone Encounter (Signed)
Krystal Jacobson- I am looking at the sinus cavity because dental work started her symptoms.

## 2018-06-26 ENCOUNTER — Telehealth: Payer: Self-pay | Admitting: Neurology

## 2018-06-26 ENCOUNTER — Other Ambulatory Visit: Payer: Self-pay | Admitting: Neurology

## 2018-06-26 DIAGNOSIS — G500 Trigeminal neuralgia: Secondary | ICD-10-CM

## 2018-06-26 NOTE — Telephone Encounter (Signed)
I spoke to the patient and informed patient that Dr. Brett Fairy has switch the order to a MRI brain to look for the dental work she is aware she will get a call from GI. I lvm for Bryanna to give me a call back from GI to update her on this.

## 2018-06-26 NOTE — Telephone Encounter (Signed)
Patient stated she has not heard from Dr. Salomon Fick office.

## 2018-06-26 NOTE — Telephone Encounter (Signed)
Referral was sent on 06/19/2018 Dr. Salomon Fick is still reviewing record . Called and relayed to Patient . [phone]

## 2018-06-26 NOTE — Telephone Encounter (Signed)
Order placed

## 2018-06-26 NOTE — Telephone Encounter (Signed)
I spoke to the patient and informed patient that Dr. Brett Fairy has switch the order to a MRI brain to look for the dental work she is aware she will get a call from GI. I lvm for Krystal Monroe to give me a call back from GI to update her on this.

## 2018-06-26 NOTE — Telephone Encounter (Signed)
Patient calling to check on CT scan & referral to [facility] for gamma knife procedure. She states that she spoke to you last week & is requesting a call back from you.

## 2018-06-26 NOTE — Telephone Encounter (Signed)
I spoke to Krystal Monroe at [place] and she informed me anything with chronic sinus is CT but if it is to look for dental work it is MRI trigeminal.

## 2018-06-27 ENCOUNTER — Telehealth: Payer: Self-pay | Admitting: Neurology

## 2018-06-27 NOTE — Telephone Encounter (Signed)
Called the pt back and reviewed the step by step instructions of her medication. Patient verbalized understanding and was appreciative for the call back.

## 2018-06-27 NOTE — Telephone Encounter (Signed)
Pt is asking for a call from RN to discuss how to properly take her lamoTRIgine (LAMICTAL) 25 MG tablet

## 2018-07-05 ENCOUNTER — Other Ambulatory Visit: Payer: Self-pay | Admitting: Neurology

## 2018-07-05 ENCOUNTER — Ambulatory Visit
Admission: RE | Admit: 2018-07-05 | Discharge: 2018-07-05 | Disposition: A | Discharge status: Home / Self Care | Payer: Medicare Other | Source: Ambulatory Visit | Attending: Neurology | Admitting: Neurology

## 2018-07-05 DIAGNOSIS — G500 Trigeminal neuralgia: Secondary | ICD-10-CM | POA: Diagnosis not present

## 2018-07-07 ENCOUNTER — Telehealth: Payer: Self-pay | Admitting: Neurology

## 2018-07-07 NOTE — Telephone Encounter (Signed)
-----   Message from Larey Seat, MD sent at 07/06/2018  5:45 PM EST ----- Normal brain MRI, no thalamic or brainstem ischemia,

## 2018-07-07 NOTE — Telephone Encounter (Signed)
Called the pt and reviewed her MRI results with her. Dr Dohmeier looked and stated the MRI was normal. Dr Dohmeier reviewed to make sure she didn't see any dental causes for the neuralgia and she states she didn't see anything but she did note there was a deviated septum. Patient was very appreciative for the call and had no questions. Pt states she is scheduled with Dr Salomon Fick end of January.

## 2018-08-07 MED FILL — lamoTRIgine 25 MG TABS: 25 | 40 days supply | Qty: 240 | Fill #1

## 2018-08-17 ENCOUNTER — Ambulatory Visit (INDEPENDENT_AMBULATORY_CARE_PROVIDER_SITE_OTHER): Payer: Medicare Other | Admitting: Neurology

## 2018-08-17 DIAGNOSIS — R0683 Snoring: Secondary | ICD-10-CM

## 2018-08-17 DIAGNOSIS — G500 Trigeminal neuralgia: Secondary | ICD-10-CM

## 2018-08-17 DIAGNOSIS — E6609 Other obesity due to excess calories: Secondary | ICD-10-CM

## 2018-08-17 DIAGNOSIS — J189 Pneumonia, unspecified organism: Secondary | ICD-10-CM

## 2018-08-17 DIAGNOSIS — Z6831 Body mass index (BMI) 31.0-31.9, adult: Secondary | ICD-10-CM

## 2018-08-17 DIAGNOSIS — G4733 Obstructive sleep apnea (adult) (pediatric): Secondary | ICD-10-CM

## 2018-08-17 DIAGNOSIS — G4719 Other hypersomnia: Secondary | ICD-10-CM

## 2018-08-17 DIAGNOSIS — G2581 Restless legs syndrome: Secondary | ICD-10-CM

## 2018-08-17 DIAGNOSIS — Z5181 Encounter for therapeutic drug level monitoring: Secondary | ICD-10-CM

## 2018-08-17 DIAGNOSIS — G4761 Periodic limb movement disorder: Secondary | ICD-10-CM

## 2018-08-21 MED FILL — LORazepam 1 MG TABS: 1 | 30 days supply | Qty: 30 | Fill #1

## 2018-08-28 MED FILL — ESCITALOPRAM 10 MG TABLET: 10 | 90 days supply | Qty: 90 | Fill #3

## 2018-08-28 MED FILL — TRIAMTERENE/HCTZ 37.5/25 CP: 37.5-25 | 90 days supply | Qty: 90 | Fill #3

## 2018-08-28 MED FILL — OMEPRAZOLE 20 MG CPDR: 20 | 90 days supply | Qty: 90 | Fill #2

## 2018-09-01 NOTE — Procedures (Signed)
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  She couldn't sleep the night of the test. She snores horribly,  Sleep habits are as follows:  Her bedtime is around 9 PM and she feels very tired already by that time.  She sleeps in a separate bedroom from her husband because of her loud snoring.  She does watch TV in her bedroom, otherwise the bedroom would be cool, quiet and dark.  The TV however stays on most of the night. She wakes up frequently moves, tosses and turns she feels too hot in the bed but she reports that she does not have nocturia or at least no more than once at night. In these days she has woken up unrefreshed and unrestored, has a dry mouth in the morning, a dull more generalized throbbing headache that she wakes up with but not wake up from. Takes 2 Excedrin in AM.  Naps when she can- after lunch naps last never more than 1 hour and make her even more groggy,   She is also not an alcohol consumer, no coffee, drinks iced tea and soda/ 3 caffeinated beverages a day.  Anxiety, Asthma, Basal cell carcinoma of nose, Depression, Diverticulosis, Gallstones, GERD, Hypertension, Obesity, OSA, Trigeminal neuralgia  The patient endorsed the Epworth Sleepiness Scale at 2/24 points, FSS at 47/63 points.   The patient's weight 178 pounds with a height of 63 (inches), resulting in a BMI of 31.6 kg/m2. The patient's neck circumference measured 15 inches.  CURRENT MEDICATIONS: Lexapro, Prilosec, Dyazide, Norco, Advil, Metamucil fiber   PROCEDURE:  This is a multichannel digital polysomnogram utilizing the Somnostar 11.2 system.  Electrodes and sensors were applied and monitored per AASM Specifications.   EEG, EOG, Chin and Limb EMG, were sampled at 200 Hz.  ECG, Snore  and Nasal Pressure, Thermal Airflow, Respiratory Effort, CPAP Flow and Pressure, Oximetry was sampled at 50 Hz. Digital video and audio were recorded.      BASELINE STUDY: Lights Out was at 00:09 and Lights On at 07:29.  Total recording time (TRT) was 440.5 minutes, with a total sleep time (TST) of 357 minutes.   The patient's sleep latency was 55.5 minutes.  REM latency was 192 minutes.  The sleep efficiency was 81.0 %.     SLEEP ARCHITECTURE: WASO (Wake after sleep onset) was 41 minutes.  There were 35 minutes in Stage N1, 190 minutes Stage N2, 97.5 minutes Stage N3 and 34.5 minutes in Stage REM. The percentage of Stage N1 was 9.8%, Stage N2 was 53.2%, Stage N3 was 27.3% and Stage R (REM sleep) was 9.7%.     RESPIRATORY ANALYSIS:  There were a total of 92 respiratory events:  There were 92 hypopneas with a hypopnea index of 15.5 /hour. The patient also had several respiratory event related arousals (RERAs).  The total APNEA/HYPOPNEA INDEX (AHI) was 15.5 /hour and the total RESPIRATORY DISTURBANCE INDEX was 18.1 /hour.  2 events occurred in REM sleep and 180 events in NREM. The REM AHI was 0. 3.5 /hour, versus a non-REM AHI of 16.7. The patient spent 74 minutes of total sleep time in the supine position and 283 minutes in non-supine. The  supine AHI was 52.7/h versus a non-supine AHI of 5.7/h.  OXYGEN SATURATION & C02:  The Wake baseline 02 saturation was 93%, with the lowest being 85%. Time spent below 89% saturation equaled 36 minutes.  AROUSALS/ PERIODIC LIMB MOVEMENTS: The arousals were noted as: 30 were spontaneous, 49 were associated with PLMs, and 48 were associated with respiratory events.  The patient had a total of 139 Periodic Limb Movements.  The Periodic Limb Movement (PLM) index was 23.4 and the PLM Arousal index was 8.2/hour.  Audio and video analysis did not show any abnormal or unusual movements, behaviors, phonations or vocalizations.  The patient took bathroom breaks. Snoring was  noted. EKG was in keeping with normal sinus rhythm (NSR). Post-study, the patient indicated that sleep was better than usual.   IMPRESSION:  1. Obstructive Sleep Apnea (OSA), AHI of 15.5, moderate to loud  snoring reflected in RDI of 18.1/h. 2. Strong supine sleep component.  3. Periodic Limb Movement Disorder (PLMD), causing arousals at 8.2/h. severe. 4. Primary Snoring 5. Normal EKG, NSR.   RECOMMENDATIONS:  1. Advise full work up of PLMs- we already obtained ferritin and TIBC in our last visit.  2. Return for full-night, attended, CPAP titration study to optimize therapy of OSA and snoring.     I certify that I have reviewed the entire raw data recording prior to the issuance of this report in accordance with the Standards of Accreditation of the American Academy of Sleep Medicine (AASM)    Larey Seat, MD  09-01-2018 Diplomat, American Board of Psychiatry and Neurology  Diplomat, American Board of [place] Director, [employer] [facility] [employer]

## 2018-09-01 NOTE — Addendum Note (Signed)
Addended by: Larey Seat on: 09/01/2018 11:43 AM   Modules accepted: Orders

## 2018-09-04 ENCOUNTER — Encounter: Payer: Self-pay | Admitting: Neurology

## 2018-09-04 ENCOUNTER — Telehealth: Payer: Self-pay | Admitting: Neurology

## 2018-09-04 NOTE — Telephone Encounter (Signed)
Called patient to discuss sleep study results. No answer at this time. LVM for the patient to call back.  Will also send a mychart message.  

## 2018-09-04 NOTE — Telephone Encounter (Signed)
-----   Message from Larey Seat, MD sent at 09/01/2018 11:43 AM EST ----- IMPRESSION:  1. Obstructive Sleep Apnea (OSA), AHI of 15.5, moderate to loud  snoring reflected in RDI of 18.1/h. 2. Strong supine sleep component.  3. Periodic Limb Movement Disorder (PLMD), causing arousals at  8.2/h. severe. 4. Primary Snoring 5. Normal EKG, NSR.   RECOMMENDATIONS:  1. Advise full work up of PLMs- we already obtained ferritin and  TIBC in our last visit.  2. Return for full-night, attended, CPAP titration study to  optimize therapy of OSA and snoring. Avoid supine sleep.

## 2018-09-05 ENCOUNTER — Other Ambulatory Visit: Payer: Self-pay | Admitting: Neurology

## 2018-09-05 DIAGNOSIS — R0683 Snoring: Secondary | ICD-10-CM

## 2018-09-05 DIAGNOSIS — G4733 Obstructive sleep apnea (adult) (pediatric): Secondary | ICD-10-CM

## 2018-09-05 DIAGNOSIS — G4719 Other hypersomnia: Secondary | ICD-10-CM

## 2018-09-11 DIAGNOSIS — G500 Trigeminal neuralgia: Secondary | ICD-10-CM | POA: Diagnosis not present

## 2018-09-11 DIAGNOSIS — Z79899 Other long term (current) drug therapy: Secondary | ICD-10-CM | POA: Diagnosis not present

## 2018-09-20 DIAGNOSIS — Z803 Family history of malignant neoplasm of breast: Secondary | ICD-10-CM | POA: Diagnosis not present

## 2018-09-20 DIAGNOSIS — Z1231 Encounter for screening mammogram for malignant neoplasm of breast: Secondary | ICD-10-CM | POA: Diagnosis not present

## 2018-09-20 LAB — HM MAMMOGRAPHY

## 2018-09-21 MED FILL — lamoTRIgine 25 MG TABS: 25 | 40 days supply | Qty: 240 | Fill #2

## 2018-10-05 ENCOUNTER — Encounter: Payer: Self-pay | Admitting: Family Medicine

## 2018-10-18 NOTE — Progress Notes (Signed)
Subjective:   Krystal Monroe is a 68 y.o. female who presents for Medicare Annual (Subsequent) preventive examination.  Review of Systems:  No ROS.  Medicare Wellness Visit. Additional risk factors are reflected in the social history.  Cardiac Risk Factors include: advanced age (>41men, >62 women);obesity (BMI >30kg/m2);sedentary lifestyle;hypertension Sleep patterns: no sleep issues.    Home Safety/Smoke Alarms: Feels safe in home. Smoke alarms in place.  Living environment; residence and Firearm Safety: 1-story house/ trailer. Seat Belt Safety/Bike Helmet: Wears seat belt.   Female:   Pap- N/A d/t age, hysterectomy     Mammo-  09/20/2018, due 09/2019     Dexa scan- 11/2016, due 11/2018. Pt. Declined follow-up.        CCS- 02/2016, due 02/2019.      Objective:     Vitals: BP 118/66 (BP Location: Right Arm, Patient Position: Sitting, Cuff Size: Normal)   Pulse 75   Temp 98.5 F (36.9 C) (Oral)   Resp 14   Ht 5\' 3"  (1.6 m)   Wt 172 lb (78 kg)   LMP  (Approximate)   SpO2 97%   BMI 30.47 kg/m   Body mass index is 30.47 kg/m.  Advanced Directives 10/19/2018 01/17/2017 12/28/2016 05/17/2016 03/15/2016  Does Patient Have a Medical Advance Directive? Yes No No No No  Type of Paramedic of [place];Living will - - - -  Does patient want to make changes to medical advance directive? No - Patient declined - - - -  Copy of [place] in Chart? No - copy requested - - - -  Would patient like information on creating a medical advance directive? - No - Patient declined - - -    Tobacco Social History   Tobacco Use  Smoking Status Never Smoker  Smokeless Tobacco Never Used     Counseling given: Not Answered   Past Medical History:  Diagnosis Date  . Anxiety    son killed in MVA  . Asthma    as a child  . Basal cell carcinoma of nose   . Colon polyps    2011  . Depression    son killed in [place]  . Diverticulosis of colon    diverticulitits  . Gallstones   . GERD (gastroesophageal reflux disease)   . Hypertension   . Obesity   . Sleep apnea    pt denies 03-15-16  . Trigeminal neuralgia    Past Surgical History:  Procedure Laterality Date  . ABDOMINAL HYSTERECTOMY     1997  . [place]   x 3  . CHOLECYSTECTOMY  2002  . COLONOSCOPY    . ROTATOR CUFF REPAIR Right 2017  . TOTAL ABDOMINAL HYSTERECTOMY W/ BILATERAL SALPINGOOPHORECTOMY  2001  . UPPER GASTROINTESTINAL ENDOSCOPY     Family History  Problem Relation Age of Onset  . Diabetes Maternal Grandmother   . Crohn's disease Father   . Lung cancer Father   . Breast cancer Mother   . Osteoporosis Mother   . Dementia Mother   . Diabetes Mother   . Hypertension Mother   . Colon cancer Neg Hx   . Esophageal cancer Neg Hx   . Rectal cancer Neg Hx   . Stomach cancer Neg Hx    Social History   Socioeconomic History  . Marital status: Married    Spouse name: Not on file  . Number of children: 3  . Years of education: BSN  .  Highest education level: Bachelor's degree (e.g., BA, AB, BS)  Occupational History  . 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 Financial resource strain: Not hard at all  . Food insecurity:    Worry: Never true    Inability: Never true  . Transportation needs:    Medical: No    Non-medical: No  Tobacco Use  . Smoking status: Never Smoker  . Smokeless tobacco: Never Used  Substance and Sexual Activity  . Alcohol use: No    Alcohol/week: 0.0 standard drinks  . Drug use: No  . Sexual activity: Not on file  Lifestyle  . Physical activity:    Days per week: 2 days    Minutes per session: 30 min  . Stress: Not at all  Relationships  . Social connections:    Talks on phone: More than three times a week    Gets together: Twice a week    Attends religious service: Not on file    Active member of club or organization: Not on file    Attends  meetings of clubs or organizations: Not on file    Relationship status: Married  Other Topics Concern  . Not on file  Social History Narrative   Lives with husband    caffeine 2 a day      10/19/2018:   Lives with husband and 2 golden retrievers on one level   Has three children, one daughter, one son, and one deceased son s/p MVA.   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   Enjoys reading, needlework, painting             Outpatient Encounter Medications as of 10/19/2018  Medication Sig  . ciprofloxacin (CIPRO) 500 MG tablet Take 1 tablet (500 mg total) by mouth 2 (two) times daily.  Marland Kitchen escitalopram (LEXAPRO) 10 MG tablet Take 1 tablet (10 mg total) by mouth daily.  Marland Kitchen HYDROcodone-acetaminophen (NORCO) 7.5-325 MG tablet Take 1 tablet by mouth every 4 (four) hours as needed for moderate pain.  Marland Kitchen ibuprofen (ADVIL,MOTRIN) 200 MG tablet Take 800 mg by mouth every 6 (six) hours as needed for headache or moderate pain.   Marland Kitchen lamoTRIgine (LAMICTAL) 25 MG tablet Start with 25 mg bid po for 8 days, increase by 25 mg each week up to 75 mg bid.  Marland Kitchen LORazepam (ATIVAN) 1 MG tablet 1/2-1 tablet 1 hour prior to flying  . metroNIDAZOLE (FLAGYL) 500 MG tablet 1 by mouth twice a day for diverticulitis  . omeprazole (PRILOSEC) 20 MG capsule Take 1 capsule (20 mg total) by mouth daily.  . Psyllium (METAMUCIL FIBER PO) Take 1 each by mouth daily.   Marland Kitchen triamterene-hydrochlorothiazide (DYAZIDE) 37.5-25 MG capsule Take 1 each (1 capsule total) by mouth daily.  . [DISCONTINUED] ciprofloxacin (CIPRO) 500 MG tablet Take 500 mg by mouth 2 (two) times daily.  . [DISCONTINUED] metroNIDAZOLE (FLAGYL) 500 MG tablet 1 by mouth twice a day for diverticulitis  . [DISCONTINUED] HYDROcodone-homatropine (HYCODAN) 5-1.5 MG/5ML syrup Take 5 mLs by mouth every 8 (eight) hours as needed. (Patient not taking: Reported on 06/15/2018)   Facility-Administered Encounter  Medications as of 10/19/2018  Medication  . 0.9 %  sodium chloride infusion    Activities of Daily Living In your present state of health, do you have any difficulty performing the following activities: 10/19/2018 04/24/2018  Hearing? N N  Vision? N N  Difficulty concentrating or making decisions? N N  Walking or climbing stairs? N N  Dressing or bathing? N N  Doing errands, shopping? N N  Preparing Food and eating ? N -  Using the Toilet? N -  In the past six months, have you accidently leaked urine? N -  Do you have problems with loss of bowel control? N -  Managing your Medications? N -  Managing your Finances? N -  Housekeeping or managing your Housekeeping? N -  Some recent data might be hidden    Patient Care Team: Dorena Cookey, MD as PCP - General Dohmeier, Asencion Partridge, MD as Consulting Physician (Neurology) [name], MD as Consulting Physician (Ophthalmology)    Assessment:   This is a routine wellness examination for [place]. Physical assessment deferred to PCP.   Exercise Activities and Dietary recommendations Current Exercise Habits: The patient does not participate in regular exercise at present, Exercise limited by: neurologic condition(s);cardiac condition(s) Diet (meal preparation, eat out, water intake, caffeinated beverages, dairy products, fruits and vegetables): in general, a "healthy" diet  . Has diverticulitis and is well aware of diet needs. Keeps flagyl and cipro on hand as ordered by Dr. Sherren Mocha when she starts to have flare, but has not had to take them recently.        Goals    . Exercise 150 minutes per week (moderate activity)     Will go to the gym 3 days a week; with weight bearing exercise Cardio Will explore how to meet those goals     . Patient Stated     Continue to remain pain-free, manage chronic conditions Start going to gym with husband    . Weight (lb) < 165 lb (74.8 kg)     Will be prepared when going grocery shopping Will plan meals  at least 2 weeks of meals Maybe "go - to " meals          Fall Risk Fall Risk  10/19/2018 06/15/2018 04/18/2018 01/17/2017 11/08/2016  Falls in the past year? 0 Yes No No No  Number falls in past yr: - 1 - - -  Injury with Fall? - No - - -  Risk for fall due to : - Other (Comment) - - -  Risk for fall due to: Comment - walking her dog - - -    Depression Screen PHQ 2/9 Scores 10/19/2018 04/18/2018 01/17/2017 11/08/2016  PHQ - 2 Score 0 0 0 0  PHQ- 9 Score 0 - - -    Pt. states she has been on lexapro since death of son, and has tried to wean herself off but found it difficult, so she has stayed on current treatment. Pt. states she feels emotionally well overall, staying active and engaged.  Cognitive Function MMSE - Mini Mental State Exam 12/28/2016  Not completed: (No Data)       Ad8 score reviewed for issues:  Issues making decisions: no  Less interest in hobbies / activities: no  Repeats questions, stories (family complaining): no  Trouble using ordinary gadgets (microwave, computer, phone):no  Forgets the month or year: no  Mismanaging finances: no  Remembering appts: no  Daily problems with thinking and/or memory: no Ad8 score is= 0    Immunization History  Administered Date(s) Administered  . Influenza Split 05/22/2012, 05/16/2013  . Influenza Whole 08/16/2006  . Influenza, High Dose Seasonal PF 04/24/2018  . Influenza-Unspecified 04/26/2014, 05/09/2015, 03/22/2016  . Pneumococcal Conjugate-13 10/01/2015  .  Pneumococcal Polysaccharide-23 12/28/2016  . Td 08/16/1998, 10/11/2008  . Zoster 02/25/2011    Qualifies for Shingles Vaccine? Yes, advised to go to local pharmacy.   Screening Tests Health Maintenance  Topic Date Due  . Hepatitis C Screening  09[birth year]  . TETANUS/TDAP  10/11/2018  . COLONOSCOPY  03/16/2019  . MAMMOGRAM  09/20/2020  . INFLUENZA VACCINE  Completed  . DEXA SCAN  Completed  . PNA vac Low Risk Adult  Completed         Plan:       Bring a copy of your living will and/or healthcare power of attorney to your next office visit.  Go to local pharmacy to get 2 dose series of shingrix  Will send in refills of cipro and flagyl; follow up with new PCP about any other need for refills  Follow up with GI for colonoscopy due 7/31 or thereafter  Continue staying active with grandchildren and monitoring diverticulitis diet :)  Great to meet you! Thank you for taking the time to share some of your story!   I have personally reviewed and noted the following in the patient's chart:   . Medical and social history . Use of alcohol, tobacco or illicit drugs  . Current medications and supplements . Functional ability and status . Nutritional status . Physical activity . Advanced directives . List of other physicians . Vitals . Screenings to include cognitive, depression, and falls . Referrals and appointments  In addition, I have reviewed and discussed with patient certain preventive protocols, quality metrics, and best practice recommendations. A written personalized care plan for preventive services as well as general preventive health recommendations were provided to patient.     Alphia Moh, RN  10/19/2018

## 2018-10-19 ENCOUNTER — Ambulatory Visit: Payer: Medicare Other

## 2018-10-19 ENCOUNTER — Ambulatory Visit (INDEPENDENT_AMBULATORY_CARE_PROVIDER_SITE_OTHER): Payer: Medicare Other

## 2018-10-19 VITALS — BP 118/66 | HR 75 | Temp 98.5°F | Resp 14 | Ht 63.0 in | Wt 172.0 lb

## 2018-10-19 DIAGNOSIS — Z0000 Encounter for general adult medical examination without abnormal findings: Secondary | ICD-10-CM | POA: Diagnosis not present

## 2018-10-19 DIAGNOSIS — K5792 Diverticulitis of intestine, part unspecified, without perforation or abscess without bleeding: Secondary | ICD-10-CM

## 2018-10-19 MED ORDER — METRONIDAZOLE 500 MG PO TABS: ORAL_TABLET | ORAL | 0 refills | Status: DC

## 2018-10-19 MED ORDER — CIPROFLOXACIN HCL 500 MG PO TABS: 500.0000 mg | ORAL_TABLET | Freq: Two times a day (BID) | ORAL | 0 refills | Status: DC

## 2018-10-19 MED FILL — CIPROFLOXACIN HCL 500 MG TA: 500 | 10 days supply | Qty: 20 | Fill #0

## 2018-10-19 MED FILL — metroNIDAZOLE 500 MG TABS: 500 | 10 days supply | Qty: 20 | Fill #0

## 2018-10-19 NOTE — Patient Instructions (Addendum)
Bring a copy of your living will and/or healthcare power of attorney to your next office visit.  Go to local pharmacy to get 2 dose series of shingrix  Will send in refills of cipro and flagyl; follow up with new PCP about any other need for refills  Follow up with GI for colonoscopy due 7/31 or thereafter  Continue staying active with grandchildren and monitoring diverticulitis diet :)  Great to meet you! Thank you for taking the time to share some of your story!   Krystal Monroe , Thank you for taking time to come for your Medicare Wellness Visit. I appreciate your ongoing commitment to your health goals. Please review the following plan we discussed and let me know if I can assist you in the future.   These are the goals we discussed: Goals    . Exercise 150 minutes per week (moderate activity)     Will go to the gym 3 days a week; with weight bearing exercise Cardio Will explore how to meet those goals     . Patient Stated     Continue to remain pain-free, manage chronic conditions Start going to gym with husband    . Weight (lb) < 165 lb (74.8 kg)     Will be prepared when going grocery shopping Will plan meals at least 2 weeks of meals Maybe "go - to " meals          This is a list of the screening recommended for you and due dates:  Health Maintenance  Topic Date Due  .  Hepatitis C: One time screening is recommended by Center for Disease Control  (CDC) for  adults born from [birth year] through 1967.   07-04-1951  . Tetanus Vaccine  10/11/2018  . Colon Cancer Screening  03/16/2019  . Mammogram  09/20/2020  . Flu Shot  Completed  . DEXA scan (bone density measurement)  Completed  . Pneumonia vaccines  Completed       Recombinant Zoster (Shingles) Vaccine, RZV: What You Need to Know 1. Why get vaccinated? Shingles (also called herpes zoster, or just zoster) is a painful skin rash, often with blisters. Shingles is caused by the varicella zoster virus, the same virus that  causes chickenpox. After you have chickenpox, the virus stays in your body and can cause shingles later in life. You can't catch shingles from another person. However, a person who has never had chickenpox (or chickenpox vaccine) could get chickenpox from someone with shingles. A shingles rash usually appears on one side of the face or body and heals within 2 to 4 weeks. Its main symptom is pain, which can be severe. Other symptoms can include fever, headache, chills, and upset stomach. Very rarely, a shingles infection can lead to pneumonia, hearing problems, blindness, brain inflammation (encephalitis), or death. For about 1 person in 5, severe pain can continue even long after the rash has cleared up. This long-lasting pain is called post-herpetic neuralgia (PHN). Shingles is far more common in people 79 years of age and older than in younger people, and the risk increases with age. It is also more common in people whose immune system is weakened because of a disease such as cancer, or by drugs such as steroids or chemotherapy. At least 1 million people a year in the [place] get shingles. 2. Shingles vaccine (recombinant) Recombinant shingles vaccine was approved by FDA in 2017 for the prevention of shingles. In clinical trials, it was more than 90% effective  in preventing shingles. It can also reduce the likelihood of PHN. Two doses, 2 to 6 months apart, are recommended for adults 70 and older. This vaccine is also recommended for people who have already gotten the live shingles vaccine (Zostavax). There is no live virus in this vaccine. 3. Some people should not get this vaccine Tell your vaccine provider if you:  Have any severe, life-threatening allergies. A person who has ever had a life-threatening allergic reaction after a dose of recombinant shingles vaccine, or has a severe allergy to any component of this vaccine, may be advised not to be vaccinated. Ask your health care provider if  you want information about vaccine components.  Are pregnant or breastfeeding. There is not much information about use of recombinant shingles vaccine in pregnant or nursing women. Your healthcare provider might recommend delaying vaccination.  Are not feeling well. If you have a mild illness, such as a cold, you can probably get the vaccine today. If you are moderately or severely ill, you should probably wait until you recover. Your doctor can advise you. 4. Risks of a vaccine reaction With any medicine, including vaccines, there is a chance of reactions. After recombinant shingles vaccination, a person might experience:  Pain, redness, soreness, or swelling at the site of the injection  Headache, muscle aches, fever, shivering, fatigue In clinical trials, most people got a sore arm with mild or moderate pain after vaccination, and some also had redness and swelling where they got the shot. Some people felt tired, had muscle pain, a headache, shivering, fever, stomach pain, or nausea. About 1 out of 6 people who got recombinant zoster vaccine experienced side effects that prevented them from doing regular activities. Symptoms went away on their own in about 2 to 3 days. Side effects were more common in younger people. You should still get the second dose of recombinant zoster vaccine even if you had one of these reactions after the first dose. Other things that could happen after this vaccine:  People sometimes faint after medical procedures, including vaccination. Sitting or lying down for about 15 minutes can help prevent fainting and injuries caused by a fall. Tell your provider if you feel dizzy or have vision changes or ringing in the ears.  Some people get shoulder pain that can be more severe and longer-lasting than routine soreness that can follow injections. This happens very rarely.  Any medication can cause a severe allergic reaction. Such reactions to a vaccine are estimated at  about 1 in a million doses, and would happen within a few minutes to a few hours after the vaccination. As with any medicine, there is a very remote chance of a vaccine causing a serious injury or death. The safety of vaccines is always being monitored. For more information, visit: http://www.aguilar.org/ 5. What if there is a serious problem? What should I look for?  Look for anything that concerns you, such as signs of a severe allergic reaction, very high fever, or unusual behavior. Signs of a severe allergic reaction can include hives, swelling of the face and throat, difficulty breathing, a fast heartbeat, dizziness, and weakness. These would usually start a few minutes to a few hours after the vaccination. What should I do?  If you think it is a severe allergic reaction or other emergency that can't wait, call 9-1-1 or get to the nearest hospital. Otherwise, call your health care provider. Afterward, the reaction should be reported to the Vaccine Adverse Event Reporting System (  VAERS). Your doctor should file this report, or you can do it yourself through the VAERS website at www.vaers.SamedayNews.es, or by calling 803-535-7266. VAERS does not give medical advice. 6. How can I learn more?  Ask your health care provider. He or she can give you the vaccine package insert or suggest other sources of information.  Call your local or state health department.  Contact the Centers for Disease Control and Prevention (CDC): ? Call (603)279-3049 (1-800-CDC-INFO) or ? Visit CDC's vaccines website at http://hunter.com/ CDC Vaccine Information Statement Recombinant Zoster Vaccine (09/27/2016) This information is not intended to replace advice given to you by your health care provider. Make sure you discuss any questions you have with your health care provider. Document Released: 10/12/2016 Document Revised: 03/08/2018 Document Reviewed: 03/08/2018 Elsevier Interactive Patient Education  2019  [place] Maintenance, Female Adopting a healthy lifestyle and getting preventive care can go a long way to promote health and wellness. Talk with your health care provider about what schedule of regular examinations is right for you. This is a good chance for you to check in with your provider about disease prevention and staying healthy. In between checkups, there are plenty of things you can do on your own. Experts have done a lot of research about which lifestyle changes and preventive measures are most likely to keep you healthy. Ask your health care provider for more information. Weight and diet Eat a healthy diet  Be sure to include plenty of vegetables, fruits, low-fat dairy products, and lean protein.  Do not eat a lot of foods high in solid fats, added sugars, or salt.  Get regular exercise. This is one of the most important things you can do for your health. ? Most adults should exercise for at least 150 minutes each week. The exercise should increase your heart rate and make you sweat (moderate-intensity exercise). ? Most adults should also do strengthening exercises at least twice a week. This is in addition to the moderate-intensity exercise. Maintain a healthy weight  Body mass index (BMI) is a measurement that can be used to identify possible weight problems. It estimates body fat based on height and weight. Your health care provider can help determine your BMI and help you achieve or maintain a healthy weight.  For females 29 years of age and older: ? A BMI below 18.5 is considered underweight. ? A BMI of 18.5 to 24.9 is normal. ? A BMI of 25 to 29.9 is considered overweight. ? A BMI of 30 and above is considered obese. Watch levels of cholesterol and blood lipids  You should start having your blood tested for lipids and cholesterol at 70 years of age, then have this test every 5 years.  You may need to have your cholesterol levels checked more often  if: ? Your lipid or cholesterol levels are high. ? You are older than 70 years of age. ? You are at high risk for heart disease. Cancer screening Lung Cancer  Lung cancer screening is recommended for adults 39-71 years old who are at high risk for lung cancer because of a history of smoking.  A yearly low-dose CT scan of the lungs is recommended for people who: ? Currently smoke. ? Have quit within the past 15 years. ? Have at least a 30-pack-year history of smoking. A pack year is smoking an average of one pack of cigarettes a day for 1 year.  Yearly screening should continue until it has been 15 years  since you quit.  Yearly screening should stop if you develop a health problem that would prevent you from having lung cancer treatment. Breast Cancer  Practice breast self-awareness. This means understanding how your breasts normally appear and feel.  It also means doing regular breast self-exams. Let your health care provider know about any changes, no matter how small.  If you are in your 20s or 30s, you should have a clinical breast exam (CBE) by a health care provider every 1-3 years as part of a regular health exam.  If you are 53 or older, have a CBE every year. Also consider having a breast X-ray (mammogram) every year.  If you have a family history of breast cancer, talk to your health care provider about genetic screening.  If you are at high risk for breast cancer, talk to your health care provider about having an MRI and a mammogram every year.  Breast cancer gene (BRCA) assessment is recommended for women who have family members with BRCA-related cancers. BRCA-related cancers include: ? Breast. ? Ovarian. ? Tubal. ? Peritoneal cancers.  Results of the assessment will determine the need for genetic counseling and BRCA1 and BRCA2 testing. Cervical Cancer Your health care provider may recommend that you be screened regularly for cancer of the pelvic organs (ovaries,  uterus, and vagina). This screening involves a pelvic examination, including checking for microscopic changes to the surface of your cervix (Pap test). You may be encouraged to have this screening done every 3 years, beginning at age 98.  For women ages 57-65, health care providers may recommend pelvic exams and Pap testing every 3 years, or they may recommend the Pap and pelvic exam, combined with testing for human papilloma virus (HPV), every 5 years. Some types of HPV increase your risk of cervical cancer. Testing for HPV may also be done on women of any age with unclear Pap test results.  Other health care providers may not recommend any screening for nonpregnant women who are considered low risk for pelvic cancer and who do not have symptoms. Ask your health care provider if a screening pelvic exam is right for you.  If you have had past treatment for cervical cancer or a condition that could lead to cancer, you need Pap tests and screening for cancer for at least 20 years after your treatment. If Pap tests have been discontinued, your risk factors (such as having a new sexual partner) need to be reassessed to determine if screening should resume. Some women have medical problems that increase the chance of getting cervical cancer. In these cases, your health care provider may recommend more frequent screening and Pap tests. Colorectal Cancer  This type of cancer can be detected and often prevented.  Routine colorectal cancer screening usually begins at 70 years of age and continues through 70 years of age.  Your health care provider may recommend screening at an earlier age if you have risk factors for colon cancer.  Your health care provider may also recommend using home test kits to check for hidden blood in the stool.  A small camera at the end of a tube can be used to examine your colon directly (sigmoidoscopy or colonoscopy). This is done to check for the earliest forms of colorectal  cancer.  Routine screening usually begins at age 40.  Direct examination of the colon should be repeated every 5-10 years through 70 years of age. However, you may need to be screened more often if early forms of  precancerous polyps or small growths are found. Skin Cancer  Check your skin from head to toe regularly.  Tell your health care provider about any new moles or changes in moles, especially if there is a change in a mole's shape or color.  Also tell your health care provider if you have a mole that is larger than the size of a pencil eraser.  Always use sunscreen. Apply sunscreen liberally and repeatedly throughout the day.  Protect yourself by wearing long sleeves, pants, a wide-brimmed hat, and sunglasses whenever you are outside. Heart disease, diabetes, and high blood pressure  High blood pressure causes heart disease and increases the risk of stroke. High blood pressure is more likely to develop in: ? People who have blood pressure in the high end of the normal range (130-139/85-89 mm Hg). ? People who are overweight or obese. ? People who are African American.  If you are 62-4 years of age, have your blood pressure checked every 3-5 years. If you are 55 years of age or older, have your blood pressure checked every year. You should have your blood pressure measured twice-once when you are at a hospital or clinic, and once when you are not at a hospital or clinic. Record the average of the two measurements. To check your blood pressure when you are not at a hospital or clinic, you can use: ? An automated blood pressure machine at a pharmacy. ? A home blood pressure monitor.  If you are between 68 years and 27 years old, ask your health care provider if you should take aspirin to prevent strokes.  Have regular diabetes screenings. This involves taking a blood sample to check your fasting blood sugar level. ? If you are at a normal weight and have a low risk for diabetes,  have this test once every three years after 70 years of age. ? If you are overweight and have a high risk for diabetes, consider being tested at a younger age or more often. Preventing infection Hepatitis B  If you have a higher risk for hepatitis B, you should be screened for this virus. You are considered at high risk for hepatitis B if: ? You were born in a country where hepatitis B is common. Ask your health care provider which countries are considered high risk. ? Your parents were born in a high-risk country, and you have not been immunized against hepatitis B (hepatitis B vaccine). ? You have HIV or AIDS. ? You use needles to inject street drugs. ? You live with someone who has hepatitis B. ? You have had sex with someone who has hepatitis B. ? You get hemodialysis treatment. ? You take certain medicines for conditions, including cancer, organ transplantation, and autoimmune conditions. Hepatitis C  Blood testing is recommended for: ? Everyone born from [birth year] through 1967. ? Anyone with known risk factors for hepatitis C. Sexually transmitted infections (STIs)  You should be screened for sexually transmitted infections (STIs) including gonorrhea and chlamydia if: ? You are sexually active and are younger than 70 years of age. ? You are older than 70 years of age and your health care provider tells you that you are at risk for this type of infection. ? Your sexual activity has changed since you were last screened and you are at an increased risk for chlamydia or gonorrhea. Ask your health care provider if you are at risk.  If you do not have HIV, but are at risk, it may be  recommended that you take a prescription medicine daily to prevent HIV infection. This is called pre-exposure prophylaxis (PrEP). You are considered at risk if: ? You are sexually active and do not regularly use condoms or know the HIV status of your partner(s). ? You take drugs by injection. ? You are sexually  active with a partner who has HIV. Talk with your health care provider about whether you are at high risk of being infected with HIV. If you choose to begin PrEP, you should first be tested for HIV. You should then be tested every 3 months for as long as you are taking PrEP. Pregnancy  If you are premenopausal and you may become pregnant, ask your health care provider about preconception counseling.  If you may become pregnant, take 400 to 800 micrograms (mcg) of folic acid every day.  If you want to prevent pregnancy, talk to your health care provider about birth control (contraception). Osteoporosis and menopause  Osteoporosis is a disease in which the bones lose minerals and strength with aging. This can result in serious bone fractures. Your risk for osteoporosis can be identified using a bone density scan.  If you are 27 years of age or older, or if you are at risk for osteoporosis and fractures, ask your health care provider if you should be screened.  Ask your health care provider whether you should take a calcium or vitamin D supplement to lower your risk for osteoporosis.  Menopause may have certain physical symptoms and risks.  Hormone replacement therapy may reduce some of these symptoms and risks. Talk to your health care provider about whether hormone replacement therapy is right for you. Follow these instructions at home:  Schedule regular health, dental, and eye exams.  Stay current with your immunizations.  Do not use any tobacco products including cigarettes, chewing tobacco, or electronic cigarettes.  If you are pregnant, do not drink alcohol.  If you are breastfeeding, limit how much and how often you drink alcohol.  Limit alcohol intake to no more than 1 drink per day for nonpregnant women. One drink equals 12 ounces of beer, 5 ounces of wine, or 1 ounces of hard liquor.  Do not use street drugs.  Do not share needles.  Ask your health care provider for  help if you need support or information about quitting drugs.  Tell your health care provider if you often feel depressed.  Tell your health care provider if you have ever been abused or do not feel safe at home. This information is not intended to replace advice given to you by your health care provider. Make sure you discuss any questions you have with your health care provider. Document Released: 02/15/2011 Document Revised: 01/08/2016 Document Reviewed: 05/06/2015 Elsevier Interactive Patient Education  2019 [employer].

## 2018-10-30 MED FILL — lamoTRIgine 25 MG TABS: 25 | 40 days supply | Qty: 240 | Fill #3

## 2018-11-17 ENCOUNTER — Ambulatory Visit (INDEPENDENT_AMBULATORY_CARE_PROVIDER_SITE_OTHER): Payer: Medicare Other | Admitting: Adult Health

## 2018-11-17 ENCOUNTER — Encounter: Payer: Self-pay | Admitting: Adult Health

## 2018-11-17 ENCOUNTER — Other Ambulatory Visit: Payer: Self-pay

## 2018-11-17 DIAGNOSIS — I10 Essential (primary) hypertension: Secondary | ICD-10-CM | POA: Diagnosis not present

## 2018-11-17 DIAGNOSIS — G500 Trigeminal neuralgia: Secondary | ICD-10-CM | POA: Diagnosis not present

## 2018-11-17 DIAGNOSIS — Z7689 Persons encountering health services in other specified circumstances: Secondary | ICD-10-CM

## 2018-11-17 DIAGNOSIS — F329 Major depressive disorder, single episode, unspecified: Secondary | ICD-10-CM | POA: Diagnosis not present

## 2018-11-17 DIAGNOSIS — F32A Depression, unspecified: Secondary | ICD-10-CM

## 2018-11-17 NOTE — Progress Notes (Signed)
Virtual Visit via Video Note  I connected with Krystal Monroe on 11/17/18 at 10:30 AM EDT by a video enabled telemedicine application and verified that I am speaking with the correct person using two identifiers.  Location patient: home Location provider:work or home office Persons participating in the virtual visit: patient, provider  I discussed the limitations of evaluation and management by telemedicine and the availability of in person appointments. The patient expressed understanding and agreed to proceed.   Establish Care visit. She is a pleasant 68 year old female who  has a past medical history of Anxiety, Asthma, Basal cell carcinoma of nose, Colon polyps, Depression, Diverticulosis of colon, Gallstones, GERD (gastroesophageal reflux disease), Hypertension, Obesity, and Trigeminal neuralgia.  She is a previous patient of Dr. Sherren Mocha Her last CPE was 04/2018   Acute Concerns: Establish Care  Chronic Issues: Depression -Lexapro 10 mg daily for many years.  Reports that she feels as though depression is well controlled on this medication  GERD -Prilosec 20 mg.  Feels well controlled  Hypertension -prescribed Dyazide 30.5-25.  Reports blood pressure well controlled.  She does monitor blood pressure at home and reports readings in the 784'O systolic  She denies any chest pain, shortness of breath, dizziness, lightheadedness, or headaches.  BP Readings from Last 3 Encounters:  10/19/18 118/66  06/15/18 140/80  05/29/18 132/68   H/o Trigmeninal Neuralgia - is seen by Neurology. Currently prescribed Lamictal 75 mg daily. Reports that her symptoms are well controlled with this medication. She takes Norco very infrequently if she has a severe attack. She has been evaluated for decompression therapy.   Sleep Apnea- was unable to tolerate CPAP. She will be seeing Dr. Ron Parker about an oral appliance.    Health Maintenance: Dental -- Routine - Dr. Berdine Addison  Vision -- Routine   Immunizations  -- UTD Colonoscopy -- last was 2017 -she had one polyp but also had severe diverticulosis Mammogram -- UTD PAP -- Has had hysterectomy    Treatment Team  - Dermatology - Dr. Nevada Crane  - Neurology - Dr. Brett Fairy   Past Medical History:  Diagnosis Date   Anxiety    son killed in MVA   Asthma    as a child   Basal cell carcinoma of nose    Colon polyps    2011   Depression    son killed in MVA   Diverticulosis of colon    diverticulitits   Gallstones    GERD (gastroesophageal reflux disease)    Hypertension    Obesity    Trigeminal neuralgia     Past Surgical History:  Procedure Laterality Date   ABDOMINAL HYSTERECTOMY     1997   CESAREAN SECTION  1976, 1982, 1984   x 3   CHOLECYSTECTOMY  2002   COLONOSCOPY     ROTATOR CUFF REPAIR Right 2017   TOTAL ABDOMINAL HYSTERECTOMY W/ BILATERAL SALPINGOOPHORECTOMY  2001   UPPER GASTROINTESTINAL ENDOSCOPY      Current Outpatient Medications on File Prior to Visit  Medication Sig Dispense Refill   ciprofloxacin (CIPRO) 500 MG tablet Take 1 tablet (500 mg total) by mouth 2 (two) times daily. 20 tablet 0   escitalopram (LEXAPRO) 10 MG tablet Take 1 tablet (10 mg total) by mouth daily. 100 tablet 4   HYDROcodone-acetaminophen (NORCO) 7.5-325 MG tablet Take 1 tablet by mouth every 4 (four) hours as needed for moderate pain. 30 tablet 0   ibuprofen (ADVIL,MOTRIN) 200 MG tablet Take 800 mg by  mouth every 6 (six) hours as needed for headache or moderate pain.      lamoTRIgine (LAMICTAL) 25 MG tablet Start with 25 mg bid po for 8 days, increase by 25 mg each week up to 75 mg bid. 240 tablet 3   LORazepam (ATIVAN) 1 MG tablet 1/2-1 tablet 1 hour prior to flying 30 tablet 1   metroNIDAZOLE (FLAGYL) 500 MG tablet 1 by mouth twice a day for diverticulitis 20 tablet 0   omeprazole (PRILOSEC) 20 MG capsule Take 1 capsule (20 mg total) by mouth daily. 100 capsule 4   Psyllium (METAMUCIL FIBER PO) Take 1 each by mouth  daily.      triamterene-hydrochlorothiazide (DYAZIDE) 37.5-25 MG capsule Take 1 each (1 capsule total) by mouth daily. 100 capsule 4   No current facility-administered medications on file prior to visit.     Allergies  Allergen Reactions   Other Hives, Rash and Other (See Comments)    Magnavist Contrast    Family History  Problem Relation Age of Onset   Diabetes Maternal Grandmother    Crohn's disease Father    Lung cancer Father    Breast cancer Mother    Osteoporosis Mother    Dementia Mother    Diabetes Mother    Hypertension Mother    Colon cancer Neg Hx    Esophageal cancer Neg Hx    Rectal cancer Neg Hx    Stomach cancer Neg Hx     Social History   Socioeconomic History   Marital status: Married    Spouse name: Not on file   Number of children: 3   Years of education: BSN   Highest education level: Bachelor's degree (e.g., BA, AB, BS)  Occupational History   Occupation: Therapist, 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    Comment: retired since 2019  [place] resource strain: Not hard at all   Food insecurity:    Worry: Never true    Inability: Never true   Transportation needs:    Medical: No    Non-medical: No  Tobacco Use   Smoking status: Never Smoker   Smokeless tobacco: Never Used  Substance and Sexual Activity   Alcohol use: No    Alcohol/week: 0.0 standard drinks   Drug use: No   Sexual activity: Not on file  Lifestyle   Physical activity:    Days per week: 2 days    Minutes per session: 30 min   Stress: Not at all  Relationships   Social connections:    Talks on phone: More than three times a week    Gets together: Twice a week    Attends religious service: Not on file    Active member of club or organization: Not on file    Attends meetings of clubs or organizations: Not on file    Relationship status: Married   Intimate partner violence:    Fear of current or ex partner: Not on file    Emotionally  abused: Not on file    Physically abused: Not on file    Forced sexual activity: Not on file  Other Topics Concern   Not on file  Social History Narrative   Lives with husband    caffeine 2 a day      10/19/2018:   Lives with husband and 2 golden retrievers on one level   Has three children, one daughter, one son, and one deceased son s/p MVA.   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   Enjoys reading, needlework, painting             ROS  There were no vitals taken for this visit.  Physical Exam   VITALS per patient if applicable:  GENERAL: alert, oriented, appears well and in no acute distress  HEENT: atraumatic, conjunttiva clear, no obvious abnormalities on inspection of external nose and ears  NECK: normal movements of the head and neck  LUNGS: on inspection no signs of respiratory distress, breathing rate appears normal, no obvious gross SOB, gasping or wheezing  CV: no obvious cyanosis  MS: moves all visible extremities without noticeable abnormality  PSYCH/NEURO: pleasant and cooperative, no obvious depression or anxiety, speech and thought processing grossly intact   Assessment/Plan: No problem-specific Assessment & Plan notes found for this encounter.   Discussed the following assessment and plan:  Essential hypertension  Encounter to establish care  NEURALGIA, TRIGEMINAL  Depression, unspecified depression type  She will follow up in the fall for CPE or sooner if needed    I discussed the assessment and treatment plan with the patient. The patient was provided an opportunity to ask questions and all were answered. The patient agreed with the plan and demonstrated an understanding of the instructions.   The patient was advised to call back or seek an in-person evaluation if the symptoms worsen or if the condition fails to improve as anticipated.   [name], NP

## 2018-11-28 MED FILL — TRIAMTERENE/HCTZ 37.5/25 CP: 37.5-25 | 90 days supply | Qty: 90 | Fill #0

## 2018-11-28 MED FILL — ESCITALOPRAM 10 MG TABLET: 10 | 90 days supply | Qty: 90 | Fill #0

## 2018-12-13 ENCOUNTER — Telehealth: Payer: Self-pay | Admitting: Neurology

## 2018-12-13 ENCOUNTER — Other Ambulatory Visit: Payer: Self-pay | Admitting: Neurology

## 2018-12-13 MED ORDER — LAMOTRIGINE 25 MG PO TABS: 75.0000 mg | ORAL_TABLET | Freq: Two times a day (BID) | ORAL | 2 refills | Status: DC

## 2018-12-13 MED FILL — lamoTRIgine 25 MG TABS: 25 | 90 days supply | Qty: 540 | Fill #0

## 2018-12-13 NOTE — Telephone Encounter (Signed)
Called the patient and she is up to 75 mg BID and just in need of refill sent to [facility]. I went ahead and resent it for the patient

## 2018-12-13 NOTE — Telephone Encounter (Signed)
Krystal Monroe is calling she states she has talked to [place] long a few times his name is Krystal Monroe and she is worried because she is getting really low on her lamoTRIgine (LAMICTAL) 25 MG tablet . Can you please call [facility] . Krystal Monroe is needing something from our office.

## 2018-12-21 ENCOUNTER — Ambulatory Visit: Payer: Medicare Other | Admitting: Adult Health

## 2019-01-26 DIAGNOSIS — H2513 Age-related nuclear cataract, bilateral: Secondary | ICD-10-CM | POA: Diagnosis not present

## 2019-01-26 DIAGNOSIS — H04123 Dry eye syndrome of bilateral lacrimal glands: Secondary | ICD-10-CM | POA: Diagnosis not present

## 2019-01-26 DIAGNOSIS — H5213 Myopia, bilateral: Secondary | ICD-10-CM | POA: Diagnosis not present

## 2019-02-02 ENCOUNTER — Encounter: Payer: Self-pay | Admitting: Gastroenterology

## 2019-03-05 MED FILL — ESCITALOPRAM 10 MG TABLET: 10 | 90 days supply | Qty: 90 | Fill #1

## 2019-03-05 MED FILL — OMEPRAZOLE 20 MG CPDR: 20 | 90 days supply | Qty: 90 | Fill #0

## 2019-03-05 MED FILL — TRIAMTERENE/HCTZ 37.5/25 CP: 37.5-25 | 90 days supply | Qty: 90 | Fill #1

## 2019-03-22 MED FILL — lamoTRIgine 25 MG TABS: 25 | 90 days supply | Qty: 540 | Fill #0

## 2019-04-16 ENCOUNTER — Encounter: Payer: Self-pay | Admitting: Gastroenterology

## 2019-05-09 ENCOUNTER — Other Ambulatory Visit: Payer: Self-pay

## 2019-05-09 ENCOUNTER — Ambulatory Visit (AMBULATORY_SURGERY_CENTER): Payer: Self-pay | Admitting: *Deleted

## 2019-05-09 VITALS — Temp 98.2°F | Ht 63.0 in | Wt 174.2 lb

## 2019-05-09 DIAGNOSIS — Z86010 Personal history of colonic polyps: Secondary | ICD-10-CM

## 2019-05-09 MED ORDER — NA SULFATE-K SULFATE-MG SULF 17.5-3.13-1.6 GM/177ML PO SOLN: ORAL | 0 refills | Status: DC

## 2019-05-09 MED FILL — SUPREP BOWEL PREP KIT: 17.5-3.13-1 | 1 days supply | Qty: 354 | Fill #0

## 2019-05-09 NOTE — Progress Notes (Signed)
Patient is here in-person for PV. Patient denies any allergies to eggs or soy. Patient denies any problems with anesthesia/sedation. Patient denies any oxygen use at home. Patient denies taking any diet/weight loss medications or blood thinners. Patient is not being treated for MRSA or C-diff.   Pt is aware that care partner will wait in the car during procedure; if they feel like they will be too hot to wait in the car; they may wait in the lobby.  We want them to wear a mask (we do not have any that we can provide them), practice social distancing, and we will check their temperatures when they get here.  I did remind patient that their care partner needs to stay in the parking lot the entire time. Pt will wear mask into building.

## 2019-05-22 ENCOUNTER — Telehealth: Payer: Self-pay

## 2019-05-22 NOTE — Telephone Encounter (Signed)
Covid-19 screening questions   Do you now or have you had a fever in the last 14 days?  Do you have any respiratory symptoms of shortness of breath or cough now or in the last 14 days?  Do you have any family members or close contacts with diagnosed or suspected Covid-19 in the past 14 days?  Have you been tested for Covid-19 and found to be positive?       

## 2019-05-23 ENCOUNTER — Other Ambulatory Visit: Payer: Self-pay

## 2019-05-23 ENCOUNTER — Encounter: Payer: Self-pay | Admitting: Gastroenterology

## 2019-05-23 ENCOUNTER — Other Ambulatory Visit: Payer: Self-pay | Admitting: Gastroenterology

## 2019-05-23 ENCOUNTER — Ambulatory Visit (AMBULATORY_SURGERY_CENTER): Payer: Medicare Other | Admitting: Gastroenterology

## 2019-05-23 VITALS — BP 126/64 | HR 62 | Temp 98.0°F | Resp 16 | Ht 63.0 in | Wt 174.2 lb

## 2019-05-23 DIAGNOSIS — K621 Rectal polyp: Secondary | ICD-10-CM | POA: Diagnosis not present

## 2019-05-23 DIAGNOSIS — D123 Benign neoplasm of transverse colon: Secondary | ICD-10-CM

## 2019-05-23 DIAGNOSIS — D128 Benign neoplasm of rectum: Secondary | ICD-10-CM

## 2019-05-23 DIAGNOSIS — Z86010 Personal history of colonic polyps: Secondary | ICD-10-CM | POA: Diagnosis not present

## 2019-05-23 DIAGNOSIS — D122 Benign neoplasm of ascending colon: Secondary | ICD-10-CM | POA: Diagnosis not present

## 2019-05-23 DIAGNOSIS — D129 Benign neoplasm of anus and anal canal: Secondary | ICD-10-CM

## 2019-05-23 MED ORDER — SODIUM CHLORIDE 0.9 % IV SOLN: 500.0000 mL | Freq: Once | INTRAVENOUS | Status: DC

## 2019-05-23 NOTE — Patient Instructions (Signed)
Please read handouts provided. Continue present medications. Await pathology results.        YOU HAD AN ENDOSCOPIC PROCEDURE TODAY AT THE [facility]:   Refer to the procedure report that was given to you for any specific questions about what was found during the examination.  If the procedure report does not answer your questions, please call your gastroenterologist to clarify.  If you requested that your care partner not be given the details of your procedure findings, then the procedure report has been included in a sealed envelope for you to review at your convenience later.  YOU SHOULD EXPECT: Some feelings of bloating in the abdomen. Passage of more gas than usual.  Walking can help get rid of the air that was put into your GI tract during the procedure and reduce the bloating. If you had a lower endoscopy (such as a colonoscopy or flexible sigmoidoscopy) you may notice spotting of blood in your stool or on the toilet paper. If you underwent a bowel prep for your procedure, you may not have a normal bowel movement for a few days.  Please Note:  You might notice some irritation and congestion in your nose or some drainage.  This is from the oxygen used during your procedure.  There is no need for concern and it should clear up in a day or so.  SYMPTOMS TO REPORT IMMEDIATELY:   Following lower endoscopy (colonoscopy or flexible sigmoidoscopy):  Excessive amounts of blood in the stool  Significant tenderness or worsening of abdominal pains  Swelling of the abdomen that is new, acute  Fever of 100F or higher    For urgent or emergent issues, a gastroenterologist can be reached at any hour by calling [phone].   DIET:  We do recommend a small meal at first, but then you may proceed to your regular diet.  Drink plenty of fluids but you should avoid alcoholic beverages for 24 hours.  ACTIVITY:  You should plan to take it easy for the rest of today and you should NOT  DRIVE or use heavy machinery until tomorrow (because of the sedation medicines used during the test).    FOLLOW UP: Our staff will call the number listed on your records 48-72 hours following your procedure to check on you and address any questions or concerns that you may have regarding the information given to you following your procedure. If we do not reach you, we will leave a message.  We will attempt to reach you two times.  During this call, we will ask if you have developed any symptoms of COVID 19. If you develop any symptoms (ie: fever, flu-like symptoms, shortness of breath, cough etc.) before then, please call [phone].  If you test positive for Covid 19 in the 2 weeks post procedure, please call and report this information to us.    If any biopsies were taken you will be contacted by phone or by letter within the next 1-3 weeks.  Please call us at [phone] if you have not heard about the biopsies in 3 weeks.    SIGNATURES/CONFIDENTIALITY: You and/or your care partner have signed paperwork which will be entered into your electronic medical record.  These signatures attest to the fact that that the information above on your After Visit Summary has been reviewed and is understood.  Full responsibility of the confidentiality of this discharge information lies with you and/or your care-partner. 

## 2019-05-23 NOTE — Op Note (Signed)
[facility] Patient Name: Krystal Monroe Procedure Date: 05/23/2019 9:31 AM MRN: [id number] Endoscopist: Mauri Pole , MD Age: 68 Referring MD:  Date of Birth: March 23, 1951 Gender: Female Account #: [id number] Procedure:                Colonoscopy Indications:              High risk colon cancer surveillance: Personal                            history of adenoma (10 mm or greater in size) Medicines:                Monitored Anesthesia Care Procedure:                Pre-Anesthesia Assessment:                           - Prior to the procedure, a History and Physical                            was performed, and patient medications and                            allergies were reviewed. The patient's tolerance of                            previous anesthesia was also reviewed. The risks                            and benefits of the procedure and the sedation                            options and risks were discussed with the patient.                            All questions were answered, and informed consent                            was obtained. Prior Anticoagulants: The patient has                            taken no previous anticoagulant or antiplatelet                            agents. ASA Grade Assessment: II - A patient with                            mild systemic disease. After reviewing the risks                            and benefits, the patient was deemed in                            satisfactory condition to undergo the procedure.  After obtaining informed consent, the colonoscope                            was passed under direct vision. Throughout the                            procedure, the patient's blood pressure, pulse, and                            oxygen saturations were monitored continuously. The                            Colonoscope was introduced through the anus and                            advanced to the  the cecum, identified by                            appendiceal orifice and ileocecal valve. The                            colonoscopy was performed without difficulty. The                            patient tolerated the procedure well. The quality                            of the bowel preparation was excellent. The                            ileocecal valve, appendiceal orifice, and rectum                            were photographed. Scope In: 9:39:47 AM Scope Out: 10:03:21 AM Scope Withdrawal Time: 0 hours 15 minutes 58 seconds  Total Procedure Duration: 0 hours 23 minutes 34 seconds  Findings:                 The perianal and digital rectal examinations were                            normal.                           Five sessile polyps were found in the rectum,                            transverse colon and ascending colon. The polyps                            were 4 to 8 mm in size. These polyps were removed                            with a cold snare. Resection and retrieval were  complete.                           Scattered small and large-mouthed diverticula were                            found in the sigmoid colon, descending colon,                            transverse colon and ascending colon.                           Non-bleeding internal hemorrhoids were found during                            retroflexion. The hemorrhoids were small.                           The exam was otherwise without abnormality. Complications:            No immediate complications. Estimated Blood Loss:     Estimated blood loss was minimal. Impression:               - Five 4 to 8 mm polyps in the rectum, in the                            transverse colon and in the ascending colon,                            removed with a cold snare. Resected and retrieved.                           - Moderate diverticulosis in the sigmoid colon, in                             the descending colon, in the transverse colon and                            in the ascending colon.                           - Non-bleeding internal hemorrhoids.                           - The examination was otherwise normal. Recommendation:           - Patient has a contact number available for                            emergencies. The signs and symptoms of potential                            delayed complications were discussed with the                            patient. Return to normal activities tomorrow.  Written discharge instructions were provided to the                            patient.                           - Resume previous diet.                           - Continue present medications.                           - Await pathology results.                           - Repeat colonoscopy in 3 - 5 years for                            surveillance based on pathology results. Mauri Pole, MD 05/23/2019 10:07:00 AM This report has been signed electronically.

## 2019-05-23 NOTE — Progress Notes (Signed)
PT taken to PACU. Monitors in place. VSS. Report given to RN. 

## 2019-05-23 NOTE — Progress Notes (Signed)
Pt's states no medical or surgical changes since previsit or office visit.  Temp taken by KA VS taken by CW

## 2019-05-23 NOTE — Progress Notes (Signed)
Called to room to assist during endoscopic procedure.  Patient ID and intended procedure confirmed with present staff. Received instructions for my participation in the procedure from the performing physician.  

## 2019-05-25 ENCOUNTER — Telehealth: Payer: Self-pay | Admitting: *Deleted

## 2019-05-25 NOTE — Telephone Encounter (Signed)
  Follow up Call-  Call back number 05/23/2019  Post procedure Call Back phone  # [phone]  Permission to leave phone message Yes  Some recent data might be hidden     Patient questions:  Do you have a fever, pain , or abdominal swelling? No. Pain Score  0 *  Have you tolerated food without any problems? Yes.    Have you been able to return to your normal activities? Yes.    Do you have any questions about your discharge instructions: Diet   No. Medications  No. Follow up visit  No.  Do you have questions or concerns about your Care? No.  Actions: * If pain score is 4 or above: No action needed, pain <4.  1. Have you developed a fever since your procedure? NO  2.   Have you had an respiratory symptoms (SOB or cough) since your procedure? NO  3.   Have you tested positive for COVID 19 since your procedure NO  4.   Have you had any family members/close contacts diagnosed with the COVID 19 since your procedure?  NO   If yes to any of these questions please route to Joylene John, RN and Alphonsa Gin, RN.

## 2019-05-31 ENCOUNTER — Encounter: Payer: Self-pay | Admitting: Gastroenterology

## 2019-06-06 ENCOUNTER — Encounter: Payer: Self-pay | Admitting: Adult Health

## 2019-06-07 ENCOUNTER — Other Ambulatory Visit: Payer: Self-pay | Admitting: Family Medicine

## 2019-06-07 DIAGNOSIS — F329 Major depressive disorder, single episode, unspecified: Secondary | ICD-10-CM

## 2019-06-07 DIAGNOSIS — K2100 Gastro-esophageal reflux disease with esophagitis, without bleeding: Secondary | ICD-10-CM

## 2019-06-07 DIAGNOSIS — F32A Depression, unspecified: Secondary | ICD-10-CM

## 2019-06-07 DIAGNOSIS — I10 Essential (primary) hypertension: Secondary | ICD-10-CM

## 2019-06-07 MED ORDER — ESCITALOPRAM OXALATE 10 MG PO TABS: 10.0000 mg | ORAL_TABLET | Freq: Every day | ORAL | 0 refills | Status: DC

## 2019-06-07 MED ORDER — OMEPRAZOLE 20 MG PO CPDR: 20.0000 mg | DELAYED_RELEASE_CAPSULE | Freq: Every day | ORAL | 0 refills | Status: DC

## 2019-06-07 MED ORDER — TRIAMTERENE-HCTZ 37.5-25 MG PO CAPS: 1.0000 | ORAL_CAPSULE | Freq: Every day | ORAL | 0 refills | Status: DC

## 2019-06-07 MED FILL — ESCITALOPRAM 10 MG TABLET: 10 | 90 days supply | Qty: 90 | Fill #0

## 2019-06-07 MED FILL — OMEPRAZOLE 20 MG CAPSULE DR: 20 | 90 days supply | Qty: 90 | Fill #0

## 2019-06-07 MED FILL — TRIAMTERENE/HCTZ 37.5/25 CP: 37.5-25 | 90 days supply | Qty: 90 | Fill #0

## 2019-06-13 ENCOUNTER — Ambulatory Visit (INDEPENDENT_AMBULATORY_CARE_PROVIDER_SITE_OTHER): Payer: Medicare Other | Admitting: Adult Health

## 2019-06-13 ENCOUNTER — Encounter: Payer: Self-pay | Admitting: Adult Health

## 2019-06-13 ENCOUNTER — Other Ambulatory Visit: Payer: Self-pay

## 2019-06-13 VITALS — BP 142/82 | Temp 97.2°F | Ht 64.0 in | Wt 171.0 lb

## 2019-06-13 DIAGNOSIS — K5792 Diverticulitis of intestine, part unspecified, without perforation or abscess without bleeding: Secondary | ICD-10-CM

## 2019-06-13 DIAGNOSIS — F329 Major depressive disorder, single episode, unspecified: Secondary | ICD-10-CM | POA: Diagnosis not present

## 2019-06-13 DIAGNOSIS — G500 Trigeminal neuralgia: Secondary | ICD-10-CM | POA: Diagnosis not present

## 2019-06-13 DIAGNOSIS — F419 Anxiety disorder, unspecified: Secondary | ICD-10-CM | POA: Diagnosis not present

## 2019-06-13 DIAGNOSIS — I10 Essential (primary) hypertension: Secondary | ICD-10-CM

## 2019-06-13 DIAGNOSIS — Z23 Encounter for immunization: Secondary | ICD-10-CM

## 2019-06-13 DIAGNOSIS — K2100 Gastro-esophageal reflux disease with esophagitis, without bleeding: Secondary | ICD-10-CM | POA: Diagnosis not present

## 2019-06-13 DIAGNOSIS — F32A Depression, unspecified: Secondary | ICD-10-CM

## 2019-06-13 MED ORDER — ESCITALOPRAM OXALATE 20 MG PO TABS: 20.0000 mg | ORAL_TABLET | Freq: Every day | ORAL | 1 refills | Status: DC

## 2019-06-13 MED FILL — ESCITALOPRAM 20 MG TABLET: 20 | 90 days supply | Qty: 90 | Fill #0

## 2019-06-13 NOTE — Addendum Note (Signed)
Addended by: Miles Costain T on: 06/13/2019 02:48 PM   Modules accepted: Orders

## 2019-06-13 NOTE — Patient Instructions (Signed)
It was great seeing you today   Please stop at the front desk and schedule a lab visit

## 2019-06-13 NOTE — Progress Notes (Signed)
Subjective:    Patient ID: Krystal Monroe, female    DOB: 1950-12-31, 68 y.o.   MRN: [id number]  HPI Patient presents for yearly preventative medicine examination. He is a pleasant 68 year old female who  has a past medical history of Anxiety, Asthma, Basal cell carcinoma of nose, Colon polyps, Depression, Diverticulosis of colon, Gallstones, GERD (gastroesophageal reflux disease), Hypertension, Obesity, Sleep apnea, and Trigeminal neuralgia.  Depression -prescribed Lexapro 10 mg and has been for many years.  She feels as though her depression is well controlled on this medication but she does feel as though her anxiety has increased which is causing her to not sleep well despite taking Melatonin.   GERD -takes Prilosec 20 mg daily.  Feels well controlled  Hypertension -prescribed Dyazide 30.5-25 mg.  She reports blood pressure is well controlled.  She does monitor at home and reports readings in the [id number] systolic.  She denies chest pain, shortness of breath, dizziness, lightheadedness, or headaches.  BP Readings from Last 3 Encounters:  06/13/19 (!) 142/82  05/23/19 126/64  10/19/18 118/66   Trigeminal neuralgia-is monitored by neurology.  Currently prescribed Lamictal 75 mg daily. She recently went through a flare but is feeling better  Diverticulitis - currently being treated with Cipro and Flagyl for acute flare. Her symptoms are improving.    All immunizations and health maintenance protocols were reviewed with the patient and needed orders were placed. Due for flu vaccination.   Appropriate screening laboratory values were ordered for the patient including screening of hyperlipidemia, renal function and hepatic function. If indicated by BPH, a PSA was ordered.  Medication reconciliation,  past medical history, social history, problem list and allergies were reviewed in detail with the patient  Goals were established with regard to weight loss, exercise, and  diet in compliance  with medications  End of life planning was discussed.  She had her routine screening colonoscopy done this month, he had multiple polyps.  She is up-to-date on dental and vision screens.  Her last mammogram was in February 2020   Review of Systems  Constitutional: Negative.   HENT: Negative.   Eyes: Negative.   Respiratory: Negative.   Cardiovascular: Negative.   Gastrointestinal: Positive for abdominal pain.  Endocrine: Negative.   Genitourinary: Negative.   Musculoskeletal: Negative.   Skin: Negative.   Allergic/Immunologic: Negative.   Neurological: Negative.   Hematological: Negative.   Psychiatric/Behavioral: Negative.    Past Medical History:  Diagnosis Date  . Anxiety    son killed in MVA  . Asthma    as a child  . Basal cell carcinoma of nose   . Colon polyps    2011  . Depression    son killed in [place]  . Diverticulosis of colon    diverticulitits  . Gallstones   . GERD (gastroesophageal reflux disease)   . Hypertension   . Obesity   . Sleep apnea    mild per pt-no tx  . Trigeminal neuralgia     Social History   Socioeconomic History  . Marital status: Married    Spouse name: Not on file  . Number of children: 3  . Years of education: BSN  . Highest education level: Bachelor's degree (e.g., BA, AB, BS)  Occupational History  . 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 Financial resource strain: Not hard at all  . Food insecurity    Worry: Never true  Inability: Never true  . Transportation needs    Medical: No    Non-medical: No  Tobacco Use  . Smoking status: Never Smoker  . Smokeless tobacco: Never Used  Substance and Sexual Activity  . Alcohol use: No    Alcohol/week: 0.0 standard drinks  . Drug use: No  . Sexual activity: Not on file  Lifestyle  . Physical activity    Days per week: 2 days    Minutes per session: 30 min  . Stress: Not at all  Relationships  . Social connections     Talks on phone: More than three times a week    Gets together: Twice a week    Attends religious service: Not on file    Active member of club or organization: Not on file    Attends meetings of clubs or organizations: Not on file    Relationship status: Married  . Intimate partner violence    Fear of current or ex partner: Not on file    Emotionally abused: Not on file    Physically abused: Not on file    Forced sexual activity: Not on file  Other Topics Concern  . Not on file  Social History Narrative   Lives with husband    caffeine 2 a day      10/19/2018:   Lives with husband and 2 golden retrievers on one level   Has three children, one daughter, one son, and one deceased son s/p MVA.   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   Enjoys reading, needlework, painting             Past Surgical History:  Procedure Laterality Date  . ABDOMINAL HYSTERECTOMY     1997  . [place]   x 3  . CHOLECYSTECTOMY  2002  . COLONOSCOPY  last 03/15/2016  . ROTATOR CUFF REPAIR Right 2017  . TOTAL ABDOMINAL HYSTERECTOMY W/ BILATERAL SALPINGOOPHORECTOMY  2001  . UPPER GASTROINTESTINAL ENDOSCOPY      Family History  Problem Relation Age of Onset  . Diabetes Maternal Grandmother   . Crohn's disease Father   . Lung cancer Father   . Breast cancer Mother   . Osteoporosis Mother   . Dementia Mother   . Diabetes Mother   . Hypertension Mother   . Colon cancer Neg Hx   . Esophageal cancer Neg Hx   . Rectal cancer Neg Hx   . Stomach cancer Neg Hx   . Colon polyps Neg Hx     Allergies  Allergen Reactions  . Iodine-131 Rash  . Other Hives, Rash and Other (See Comments)    Magnavist Contrast    Current Outpatient Medications on File Prior to Visit  Medication Sig Dispense Refill  . ciprofloxacin (CIPRO) 500 MG tablet Take 1 tablet (500 mg total) by mouth 2 (two) times daily. 20 tablet  0  . escitalopram (LEXAPRO) 10 MG tablet Take 1 tablet (10 mg total) by mouth daily. 90 tablet 0  . ibuprofen (ADVIL,MOTRIN) 200 MG tablet Take 800 mg by mouth every 6 (six) hours as needed for headache or moderate pain.     Marland Kitchen lamoTRIgine (LAMICTAL) 25 MG tablet Take 3 tablets (75 mg total) by mouth 2 (two) times daily. 540 tablet 2  . metroNIDAZOLE (FLAGYL) 500 MG tablet 1 by mouth twice a day for diverticulitis 20 tablet 0  . omeprazole (PRILOSEC)  20 MG capsule Take 1 capsule (20 mg total) by mouth daily. 90 capsule 0  . Psyllium (METAMUCIL FIBER PO) Take 1 each by mouth daily.     Marland Kitchen triamterene-hydrochlorothiazide (DYAZIDE) 37.5-25 MG capsule Take 1 each (1 capsule total) by mouth daily. 90 capsule 0   No current facility-administered medications on file prior to visit.     BP (!) 142/82   Temp (!) 97.2 F (36.2 C)   Ht 5\' 4"  (1.626 m)   Wt 171 lb (77.6 kg)   BMI 29.35 kg/m       Objective:   Physical Exam Vitals signs and nursing note reviewed.  Constitutional:      General: She is not in acute distress.    Appearance: Normal appearance. She is not diaphoretic.  HENT:     Head: Normocephalic and atraumatic.     Right Ear: Tympanic membrane, ear canal and external ear normal. There is no impacted cerumen.     Left Ear: Tympanic membrane, ear canal and external ear normal. There is no impacted cerumen.     Nose: Nose normal. No congestion or rhinorrhea.     Mouth/Throat:     Mouth: Mucous membranes are moist.     Pharynx: No oropharyngeal exudate or posterior oropharyngeal erythema.  Eyes:     General: No scleral icterus.       Right eye: No discharge.        Left eye: No discharge.     Extraocular Movements: Extraocular movements intact.     Conjunctiva/sclera: Conjunctivae normal.     Pupils: Pupils are equal, round, and reactive to light.  Neck:     Musculoskeletal: Normal range of motion and neck supple. No muscular tenderness.     Thyroid: No thyromegaly.      Vascular: No carotid bruit or JVD.     Trachea: No tracheal deviation.  Cardiovascular:     Rate and Rhythm: Normal rate and regular rhythm.     Pulses: Normal pulses.     Heart sounds: Normal heart sounds. No murmur. No friction rub. No gallop.   Pulmonary:     Effort: Pulmonary effort is normal. No respiratory distress.     Breath sounds: Normal breath sounds. No stridor. No wheezing, rhonchi or rales.  Chest:     Chest wall: No tenderness.  Abdominal:     General: Bowel sounds are normal. There is no distension.     Palpations: Abdomen is soft. There is no mass.     Tenderness: There is abdominal tenderness in the left lower quadrant. There is no right CVA tenderness, left CVA tenderness, guarding or rebound.     Hernia: No hernia is present.  Musculoskeletal: Normal range of motion.        General: No swelling, tenderness, deformity or signs of injury.     Right lower leg: No edema.     Left lower leg: No edema.  Lymphadenopathy:     Cervical: No cervical adenopathy.  Skin:    General: Skin is warm and dry.     Capillary Refill: Capillary refill takes less than 2 seconds.     Coloration: Skin is not jaundiced or pale.     Findings: No bruising, erythema, lesion or rash.  Neurological:     General: No focal deficit present.     Mental Status: She is alert and oriented to person, place, and time.     Cranial Nerves: No cranial nerve deficit.  Sensory: No sensory deficit.     Motor: No weakness or abnormal muscle tone.     Coordination: Coordination normal.     Gait: Gait normal.     Deep Tendon Reflexes: Reflexes normal.  Psychiatric:        Mood and Affect: Mood normal.        Behavior: Behavior normal.        Thought Content: Thought content normal.        Judgment: Judgment normal.        Assessment & Plan:  1. Depression, unspecified depression type - Controlled.  - escitalopram (LEXAPRO) 20 MG tablet; Take 1 tablet (20 mg total) by mouth daily.  Dispense: 90  tablet; Refill: 1  2. Anxiety - Will increase Lexapro to 20 mg  - Advised follow up if no improvement in the next 2-3 weeks  - escitalopram (LEXAPRO) 20 MG tablet; Take 1 tablet (20 mg total) by mouth daily.  Dispense: 90 tablet; Refill: 1  3. Essential hypertension - No change in medication - CBC with Differential/Platelet; Future - Comprehensive metabolic panel; Future - Lipid panel; Future - TSH; Future  4. NEURALGIA, TRIGEMINAL - Continue with Lamictal   5. Diverticulitis of intestine without perforation or abscess without bleeding, unspecified part of intestinal tract - Continue with Flagyl and Cipro   6. Gastroesophageal reflux disease with esophagitis without hemorrhage - Continue with Prilosec  - CBC with Differential/Platelet; Future - Comprehensive metabolic panel; Future - Lipid panel; Future - TSH; Future  [name], NP

## 2019-06-14 ENCOUNTER — Other Ambulatory Visit (INDEPENDENT_AMBULATORY_CARE_PROVIDER_SITE_OTHER): Payer: Medicare Other

## 2019-06-14 DIAGNOSIS — I10 Essential (primary) hypertension: Secondary | ICD-10-CM

## 2019-06-14 DIAGNOSIS — K2100 Gastro-esophageal reflux disease with esophagitis, without bleeding: Secondary | ICD-10-CM | POA: Diagnosis not present

## 2019-06-14 LAB — COMPREHENSIVE METABOLIC PANEL
ALT: 25 U/L (ref 0–35)
AST: 20 U/L (ref 0–37)
Albumin: 4.6 g/dL (ref 3.5–5.2)
Alkaline Phosphatase: 58 U/L (ref 39–117)
BUN: 14 mg/dL (ref 6–23)
CO2: 29 mEq/L (ref 19–32)
Calcium: 9.5 mg/dL (ref 8.4–10.5)
Chloride: 101 mEq/L (ref 96–112)
Creatinine, Ser: 0.77 mg/dL (ref 0.40–1.20)
GFR: 74.14 mL/min (ref >60.00)
Glucose, Bld: 113 mg/dL — ABNORMAL HIGH (ref 70–99)
Potassium: 3.8 mEq/L (ref 3.5–5.1)
Sodium: 139 mEq/L (ref 135–145)
Total Bilirubin: 0.7 mg/dL (ref 0.2–1.2)
Total Protein: 6.9 g/dL (ref 6.0–8.3)

## 2019-06-14 LAB — CBC WITH DIFFERENTIAL/PLATELET
Basophils Absolute: 0 10*3/uL (ref 0.0–0.1)
Basophils Relative: 0.4 % (ref 0.0–3.0)
Eosinophils Absolute: 0.2 10*3/uL (ref 0.0–0.7)
Eosinophils Relative: 2.4 % (ref 0.0–5.0)
HCT: 38.9 % (ref 36.0–46.0)
Hemoglobin: 13.2 g/dL (ref 12.0–15.0)
Lymphocytes Relative: 18.8 % (ref 12.0–46.0)
Lymphs Abs: 1.5 10*3/uL (ref 0.7–4.0)
MCHC: 34 g/dL (ref 30.0–36.0)
MCV: 85.4 fl (ref 78.0–100.0)
Monocytes Absolute: 0.5 10*3/uL (ref 0.1–1.0)
Monocytes Relative: 7 % (ref 3.0–12.0)
Neutro Abs: 5.5 10*3/uL (ref 1.4–7.7)
Neutrophils Relative %: 71.4 % (ref 43.0–77.0)
Platelets: 234 10*3/uL (ref 150.0–400.0)
RBC: 4.55 Mil/uL (ref 3.87–5.11)
RDW: 13.7 % (ref 11.5–15.5)
WBC: 7.7 10*3/uL (ref 4.0–10.5)

## 2019-06-14 LAB — LIPID PANEL
Cholesterol: 179 mg/dL (ref 0–200)
HDL: 41.9 mg/dL (ref >39.00)
NonHDL: 137.11
Total CHOL/HDL Ratio: 4
Triglycerides: 204 mg/dL — ABNORMAL HIGH (ref 0.0–149.0)
VLDL: 40.8 mg/dL — ABNORMAL HIGH (ref 0.0–40.0)

## 2019-06-14 LAB — LDL CHOLESTEROL, DIRECT: Direct LDL: 94 mg/dL

## 2019-06-15 LAB — TSH: TSH: 1.41 u[IU]/mL (ref 0.35–4.50)

## 2019-06-25 MED FILL — lamoTRIgine 25 MG TABS: 25 | 90 days supply | Qty: 540 | Fill #1

## 2019-08-31 ENCOUNTER — Ambulatory Visit (INDEPENDENT_AMBULATORY_CARE_PROVIDER_SITE_OTHER): Payer: Medicare Other | Admitting: Adult Health

## 2019-08-31 ENCOUNTER — Encounter: Payer: Self-pay | Admitting: Adult Health

## 2019-08-31 ENCOUNTER — Ambulatory Visit (INDEPENDENT_AMBULATORY_CARE_PROVIDER_SITE_OTHER): Payer: Medicare Other

## 2019-08-31 ENCOUNTER — Other Ambulatory Visit: Payer: Self-pay

## 2019-08-31 VITALS — BP 132/68 | HR 70 | Temp 98.2°F | Ht 64.0 in | Wt 176.0 lb

## 2019-08-31 DIAGNOSIS — R42 Dizziness and giddiness: Secondary | ICD-10-CM | POA: Diagnosis not present

## 2019-08-31 DIAGNOSIS — R0789 Other chest pain: Secondary | ICD-10-CM

## 2019-08-31 NOTE — Progress Notes (Signed)
Subjective:    Patient ID: Krystal Monroe, female    DOB: 05-22-1951, 69 y.o.   MRN: [id number]  HPI 69 year old female who  has a past medical history of Anxiety, Asthma, Basal cell carcinoma of nose, Colon polyps, Depression, Diverticulosis of colon, Gallstones, GERD (gastroesophageal reflux disease), Hypertension, Obesity, Sleep apnea, and Trigeminal neuralgia.  She presents to the office today for concern of " overwhelming feeling as though she has to take a deep breath and chest heaviness"  She has a hard time explaining her symptoms.   Per patient " I feel like I have this need to take a deep breath ,and I have this heavy feeling in my chest. There is no pain, tightness, or squeezing sensation. I have noticed that when I go outside in to the cold air that it helps. When I am going about my normal activities I do not notice it but when I am resting and reading I will notice it." When she tries to control her breathing and takes deep breaths it causes lightheaded.   She denies cough, diaphoresis, chest pain, the feeling of shortness of breath,  Or wheezing  She does not feel like her anxiety is any worse and continues to feel well controlled on Celexa.   Her symptoms besides lightheadedness are present at this visit     Review of Systems See HPI   Past Medical History:  Diagnosis Date  . Anxiety    son killed in MVA  . Asthma    as a child  . Basal cell carcinoma of nose   . Colon polyps    2011  . Depression    son killed in [place]  . Diverticulosis of colon    diverticulitits  . Gallstones   . GERD (gastroesophageal reflux disease)   . Hypertension   . Obesity   . Sleep apnea    mild per pt-no tx  . Trigeminal neuralgia     Social History   Socioeconomic History  . Marital status: Married    Spouse name: Not on file  . Number of children: 3  . Years of education: BSN  . Highest education level: Bachelor's degree (e.g., BA, AB, BS)  Occupational History  .  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 Smoking status: Never Smoker  . Smokeless tobacco: Never Used  Substance and Sexual Activity  . Alcohol use: No    Alcohol/week: 0.0 standard drinks  . Drug use: No  . Sexual activity: Not on file  Other Topics Concern  . Not on file  Social History Narrative   Lives with husband    caffeine 2 a day      10/19/2018:   Lives with husband and 2 golden retrievers on one level   Has three children, one daughter, one son, and one deceased son s/p MVA.   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   Enjoys reading, needlework, painting            Social Determinants of Health   Financial Resource Strain: Low Risk   . Difficulty of Paying Living Expenses: Not hard at all  Food Insecurity: No Food Insecurity  . Worried About Charity fundraiser in the Last Year: Never true  . Ran Out of Food in the Last Year: Never true  Transportation Needs: No Transportation  Needs  . Lack of Transportation (Medical): No  . Lack of Transportation (Non-Medical): No  Physical Activity: Insufficiently Active  . Days of Exercise per Week: 2 days  . Minutes of Exercise per Session: 30 min  Stress: No Stress Concern Present  . Feeling of Stress : Not at all  Social Connections: Unknown  . Frequency of Communication with Friends and Family: More than three times a week  . Frequency of Social Gatherings with Friends and Family: Twice a week  . Attends Religious Services: Not on file  . Active Member of Clubs or Organizations: Not on file  . Attends Archivist Meetings: Not on file  . Marital Status: Married  Human resources officer Violence:   . Fear of Current or Ex-Partner: Not on file  . Emotionally Abused: Not on file  . Physically Abused: Not on file  . Sexually Abused: Not on file    Past Surgical History:  Procedure Laterality Date    . ABDOMINAL HYSTERECTOMY     1997  . [place]   x 3  . CHOLECYSTECTOMY  2002  . COLONOSCOPY  last 03/15/2016  . ROTATOR CUFF REPAIR Right 2017  . TOTAL ABDOMINAL HYSTERECTOMY W/ BILATERAL SALPINGOOPHORECTOMY  2001  . UPPER GASTROINTESTINAL ENDOSCOPY      Family History  Problem Relation Age of Onset  . Diabetes Maternal Grandmother   . Crohn's disease Father   . Lung cancer Father   . Breast cancer Mother   . Osteoporosis Mother   . Dementia Mother   . Diabetes Mother   . Hypertension Mother   . Colon cancer Neg Hx   . Esophageal cancer Neg Hx   . Rectal cancer Neg Hx   . Stomach cancer Neg Hx   . Colon polyps Neg Hx     Allergies  Allergen Reactions  . Iodine-131 Rash  . Other Hives, Rash and Other (See Comments)    Magnavist Contrast    Current Outpatient Medications on File Prior to Visit  Medication Sig Dispense Refill  . ciprofloxacin (CIPRO) 500 MG tablet Take 1 tablet (500 mg total) by mouth 2 (two) times daily. 20 tablet 0  . escitalopram (LEXAPRO) 20 MG tablet Take 1 tablet (20 mg total) by mouth daily. 90 tablet 1  . ibuprofen (ADVIL,MOTRIN) 200 MG tablet Take 800 mg by mouth every 6 (six) hours as needed for headache or moderate pain.     Marland Kitchen lamoTRIgine (LAMICTAL) 25 MG tablet Take 3 tablets (75 mg total) by mouth 2 (two) times daily. 540 tablet 2  . metroNIDAZOLE (FLAGYL) 500 MG tablet 1 by mouth twice a day for diverticulitis 20 tablet 0  . omeprazole (PRILOSEC) 20 MG capsule Take 1 capsule (20 mg total) by mouth daily. 90 capsule 0  . Psyllium (METAMUCIL FIBER PO) Take 1 each by mouth daily.     Marland Kitchen triamterene-hydrochlorothiazide (DYAZIDE) 37.5-25 MG capsule Take 1 each (1 capsule total) by mouth daily. 90 capsule 0   No current facility-administered medications on file prior to visit.    BP 132/68   Pulse 70   Temp 98.2 F (36.8 C) (Other (Comment))   Ht 5\' 4"  (1.626 m)   Wt 176 lb (79.8 kg)   SpO2 90%   BMI 30.21 kg/m        Objective:   Physical Exam Vitals reviewed.  Constitutional:      Appearance: Normal appearance.  Cardiovascular:     Rate and Rhythm: Normal  rate and regular rhythm.     Pulses: Normal pulses.     Heart sounds: Normal heart sounds. No murmur. No friction rub. No gallop.   Pulmonary:     Effort: Pulmonary effort is normal.     Breath sounds: Normal breath sounds.  Musculoskeletal:        General: Normal range of motion.  Skin:    General: Skin is warm and dry.  Neurological:     General: No focal deficit present.     Mental Status: She is alert and oriented to person, place, and time.       Assessment & Plan:  1. Feeling of chest tightness - Doubt cardiac in origin. Cool air helps. Will check EKG and chest xray -  Increase humidity in the bedroom.  - Follow up if no improvement  - EKG 12-Lead- SR, low voltage in limb leads, Rate 70 - DG Chest 2 View; Future - DG Chest 2 View  2. Lightheadedness - Likely from hyperventilation  - EKG 12-Lead - DG Chest 2 View; Future  [name], NP

## 2019-09-11 ENCOUNTER — Other Ambulatory Visit: Payer: Self-pay | Admitting: Adult Health

## 2019-09-11 DIAGNOSIS — K2100 Gastro-esophageal reflux disease with esophagitis, without bleeding: Secondary | ICD-10-CM

## 2019-09-11 DIAGNOSIS — I10 Essential (primary) hypertension: Secondary | ICD-10-CM

## 2019-09-11 MED FILL — OMEPRAZOLE DR 20 MG CAPSULE: 20 | 90 days supply | Qty: 90 | Fill #0

## 2019-09-11 MED FILL — TRIAMTERENE/HCTZ 37.5/25 CP: 37.5-25 | 90 days supply | Qty: 90 | Fill #0

## 2019-09-21 ENCOUNTER — Telehealth: Payer: Self-pay | Admitting: Neurology

## 2019-09-24 ENCOUNTER — Other Ambulatory Visit: Payer: Self-pay | Admitting: Neurology

## 2019-09-24 MED ORDER — LAMOTRIGINE 25 MG PO TABS: 75.0000 mg | ORAL_TABLET | Freq: Two times a day (BID) | ORAL | 0 refills | Status: DC

## 2019-09-24 MED FILL — lamoTRIgine 25 MG TABS: 25 | 90 days supply | Qty: 540 | Fill #0

## 2019-09-24 NOTE — Telephone Encounter (Signed)
Patient called in and stated she was told that she needed to schedule an appt for her refill, appt was scheduled for 10/16/2019 patient states she will run out of Lamictal within 3 days. She wants to know if the refill can be sent in.

## 2019-09-24 NOTE — Telephone Encounter (Signed)
Script has been sent to patient pharmacy and she must keep upcoming apt for future refills.

## 2019-09-26 LAB — HM MAMMOGRAPHY

## 2019-09-30 ENCOUNTER — Ambulatory Visit: Payer: Medicare Other | Attending: Internal Medicine

## 2019-09-30 DIAGNOSIS — Z23 Encounter for immunization: Secondary | ICD-10-CM | POA: Insufficient documentation

## 2019-09-30 NOTE — Progress Notes (Signed)
   Covid-19 Vaccination [facility]  Name:  Krystal Monroe    MRN: [id number] DOB: 06-20-1951  09/30/2019  Ms. Krystal Monroe was observed post Covid-19 immunization for 15 minutes without incidence. She was provided with Vaccine Information Sheet and instruction to access the V-Safe system.   Ms. Krystal Monroe was instructed to call 911 with any severe reactions post vaccine: Marland Kitchen Difficulty breathing  . Swelling of your face and throat  . A fast heartbeat  . A bad rash all over your body  . Dizziness and weakness    Immunizations Administered    Name Date Dose VIS Date Route   Pfizer COVID-19 Vaccine 09/30/2019 10:10 AM 0.3 mL 07/27/2019 Intramuscular   Manufacturer: [place]   Lot: [id number]   [place]: [id number]

## 2019-10-16 ENCOUNTER — Other Ambulatory Visit: Payer: Self-pay

## 2019-10-16 ENCOUNTER — Ambulatory Visit (INDEPENDENT_AMBULATORY_CARE_PROVIDER_SITE_OTHER): Payer: Medicare Other | Admitting: Neurology

## 2019-10-16 ENCOUNTER — Encounter: Payer: Self-pay | Admitting: Neurology

## 2019-10-16 ENCOUNTER — Encounter: Payer: Self-pay | Admitting: Family Medicine

## 2019-10-16 VITALS — BP 102/65 | HR 76 | Temp 98.0°F | Ht 63.5 in | Wt 173.0 lb

## 2019-10-16 DIAGNOSIS — G500 Trigeminal neuralgia: Secondary | ICD-10-CM

## 2019-10-16 DIAGNOSIS — R0683 Snoring: Secondary | ICD-10-CM

## 2019-10-16 DIAGNOSIS — G4719 Other hypersomnia: Secondary | ICD-10-CM | POA: Diagnosis not present

## 2019-10-16 DIAGNOSIS — D508 Other iron deficiency anemias: Secondary | ICD-10-CM | POA: Diagnosis not present

## 2019-10-16 DIAGNOSIS — G4733 Obstructive sleep apnea (adult) (pediatric): Secondary | ICD-10-CM

## 2019-10-16 DIAGNOSIS — G2581 Restless legs syndrome: Secondary | ICD-10-CM

## 2019-10-16 MED ORDER — HYDROCODONE-ACETAMINOPHEN 10-300 MG PO TABS: 1.0000 | ORAL_TABLET | Freq: Four times a day (QID) | ORAL | 0 refills | Status: DC | PRN

## 2019-10-16 NOTE — Patient Instructions (Signed)
Lamotrigine tablets What is this medicine? LAMOTRIGINE (la MOE tri jeen) is used to control seizures in adults and children with epilepsy and Lennox-Gastaut syndrome. It is also used in adults to treat bipolar disorder. This medicine may be used for other purposes; ask your health care provider or pharmacist if you have questions. COMMON BRAND NAME(S): Lamictal, Subvenite What should I tell my health care provider before I take this medicine? They need to know if you have any of these conditions:  aseptic meningitis during prior use of lamotrigine  depression  folate deficiency  kidney disease  liver disease  suicidal thoughts, plans, or attempt; a previous suicide attempt by you or a family member  an unusual or allergic reaction to lamotrigine or other seizure medications, other medicines, foods, dyes, or preservatives  pregnant or trying to get pregnant  breast-feeding How should I use this medicine? Take this medicine by mouth with a glass of water. Follow the directions on the prescription label. Do not chew these tablets. If this medicine upsets your stomach, take it with food or milk. Take your doses at regular intervals. Do not take your medicine more often than directed. A special MedGuide will be given to you by the pharmacist with each new prescription and refill. Be sure to read this information carefully each time. Talk to your pediatrician regarding the use of this medicine in children. While this drug may be prescribed for children as young as 2 years for selected conditions, precautions do apply. Overdosage: If you think you have taken too much of this medicine contact a poison control center or emergency room at once. NOTE: This medicine is only for you. Do not share this medicine with others. What if I miss a dose? If you miss a dose, take it as soon as you can. If it is almost time for your next dose, take only that dose. Do not take double or extra doses. What may  interact with this medicine?  atazanavir  carbamazepine  female hormones, including contraceptive or birth control pills  lopinavir  methotrexate  phenobarbital  phenytoin  primidone  pyrimethamine  rifampin  ritonavir  trimethoprim  valproic acid This list may not describe all possible interactions. Give your health care provider a list of all the medicines, herbs, non-prescription drugs, or dietary supplements you use. Also tell them if you smoke, drink alcohol, or use illegal drugs. Some items may interact with your medicine. What should I watch for while using this medicine? Visit your doctor or health care provider for regular checks on your progress. If you take this medicine for seizures, wear a Medic Alert bracelet or necklace. Carry an identification card with information about your condition, medicines, and doctor or health care provider. It is important to take this medicine exactly as directed. When first starting treatment, your dose will need to be adjusted slowly. It may take weeks or months before your dose is stable. You should contact your doctor or health care provider if your seizures get worse or if you have any new types of seizures. Do not stop taking this medicine unless instructed by your doctor or health care provider. Stopping your medicine suddenly can increase your seizures or their severity. This medicine may cause serious skin reactions. They can happen weeks to months after starting the medicine. Contact your health care provider right away if you notice fevers or flu-like symptoms with a rash. The rash may be red or purple and then turn into blisters or peeling   of the skin. Or, you might notice a red rash with swelling of the face, lips or lymph nodes in your neck or under your arms. You may get drowsy, dizzy, or have blurred vision. Do not drive, use machinery, or do anything that needs mental alertness until you know how this medicine affects you. To  reduce dizzy or fainting spells, do not sit or stand up quickly, especially if you are an older patient. Alcohol can increase drowsiness and dizziness. Avoid alcoholic drinks. If you are taking this medicine for bipolar disorder, it is important to report any changes in your mood to your doctor or health care provider. If your condition gets worse, you get mentally depressed, feel very hyperactive or manic, have difficulty sleeping, or have thoughts of hurting yourself or committing suicide, you need to get help from your health care provider right away. If you are a caregiver for someone taking this medicine for bipolar disorder, you should also report these behavioral changes right away. The use of this medicine may increase the chance of suicidal thoughts or actions. Pay special attention to how you are responding while on this medicine. Your mouth may get dry. Chewing sugarless gum or sucking hard candy, and drinking plenty of water may help. Contact your doctor if the problem does not go away or is severe. Women who become pregnant while using this medicine may enroll in the [place] Antiepileptic Drug Pregnancy Registry by calling 1-888-233-2334. This registry collects information about the safety of antiepileptic drug use during pregnancy. This medicine may cause a decrease in folic acid. You should make sure that you get enough folic acid while you are taking this medicine. Discuss the foods you eat and the vitamins you take with your health care provider. What side effects may I notice from receiving this medicine? Side effects that you should report to your doctor or health care professional as soon as possible:  allergic reactions like skin rash, itching or hives, swelling of the face, lips, or tongue  changes in vision  depressed mood  elevated mood, decreased need for sleep, racing thoughts, impulsive behavior  loss of balance or coordination  mouth sores  rash, fever, and  swollen lymph nodes  redness, blistering, peeling or loosening of the skin, including inside the mouth  right upper belly pain  seizures  severe muscle pain  signs and symptoms of aseptic meningitis such as stiff neck and sensitivity to light, headache, drowsiness, fever, nausea, vomiting, rash  signs of infection - fever or chills, cough, sore throat, pain or difficulty passing urine  suicidal thoughts or other mood changes  swollen lymph nodes  trouble walking  unusual bruising or bleeding  unusually weak or tired  yellowing of the eyes or skin Side effects that usually do not require medical attention (report to your doctor or health care professional if they continue or are bothersome):  diarrhea  dizziness  dry mouth  stuffy nose  tiredness  tremors  trouble sleeping This list may not describe all possible side effects. Call your doctor for medical advice about side effects. You may report side effects to FDA at 1-800-FDA-1088. Where should I keep my medicine? Keep out of reach of children. Store at room temperature between 15 and 30 degrees C (59 and 86 degrees F). Throw away any unused medicine after the expiration date. NOTE: This sheet is a summary. It may not cover all possible information. If you have questions about this medicine, talk to your doctor,   pharmacist, or health care provider.  2020 Elsevier/Gold Standard (2018-11-03 15:03:40) Trigeminal Neuralgia  Trigeminal neuralgia is a nerve disorder that causes severe pain on one side of the face. The pain may last from a few seconds to several minutes. The pain is usually only on one side of the face. Symptoms may occur for days, weeks, or months and then go away for months or years. The pain may return and be worse than before. What are the causes? This condition is caused by damage or pressure to a nerve in the head that is called the trigeminal nerve. An attack can be triggered  by:  Talking.  Chewing.  Putting on makeup.  Washing your face.  Shaving your face.  Brushing your teeth.  Touching your face. What increases the risk? You are more likely to develop this condition if you:  Are 79 years of age or older.  Are female. What are the signs or symptoms? The main symptom of this condition is severe pain in the:  Jaw.  Lips.  Eyes.  Nose.  Scalp.  Forehead.  Face. The pain may be:  Intense.  Stabbing.  Electric.  Shock-like. How is this diagnosed? This condition is diagnosed with a physical exam. A CT scan or an MRI may be done to rule out other conditions that can cause facial pain. How is this treated? This condition may be treated with:  Avoiding the things that trigger your symptoms.  Taking prescription medicines (anticonvulsants).  Having surgery. This may be done in severe cases if other medical treatment does not provide relief.  Having procedures such as ablation, thermal, or radiation therapy. It may take up to one month for treatment to start relieving the pain. Follow these instructions at home: Managing pain  Learn as much as you can about how to manage your pain. Ask your health care provider if a pain specialist would be helpful.  Consider talking with a mental health care provider (psychologist) about how to cope with the pain.  Consider joining a pain support group. General instructions  Take over-the-counter and prescription medicines only as told by your health care provider.  Avoid the things that trigger your symptoms. It may help to: ? Chew on the unaffected side of your mouth. ? Avoid touching your face. ? Avoid blasts of hot or cold air.  Follow your treatment plan as told by your health care provider. This may include: ? Cognitive or behavioral therapy. ? Gentle, regular exercise. ? Meditation or yoga. ? Aromatherapy.  Keep all follow-up visits as told by your health care provider. You  may need to be monitored closely to make sure treatment is working well for you. Where to find more information  F[employer] Association: fpa-support.org Contact a health care provider if:  Your medicine is not helping your symptoms.  You have side effects from the medicine used for treatment.  You develop new, unexplained symptoms, such as: ? Double vision. ? Facial weakness. ? Facial numbness. ? Changes in hearing or balance.  You feel depressed. Get help right away if:  Your pain is severe and is not getting better.  You develop suicidal thoughts. If you ever feel like you may hurt yourself or others, or have thoughts about taking your own life, get help right away. You can go to your nearest emergency department or call:  Your local emergency services (911 in the [place]  A suicide crisis helpline, such as the [place] at 870-799-4450. This  is open 24 hours a day. Summary  Trigeminal neuralgia is a nerve disorder that causes severe pain on one side of the face. The pain may last from a few seconds to several minutes.  This condition is caused by damage or pressure to a nerve in the head that is called the trigeminal nerve.  Treatment may include avoiding the things that trigger your symptoms, taking medicines, or having surgery or procedures. It may take up to one month for treatment to start relieving the pain.  Avoid the things that trigger your symptoms.  Keep all follow-up visits as told by your health care provider. You may need to be monitored closely to make sure treatment is working well for you. This information is not intended to replace advice given to you by your health care provider. Make sure you discuss any questions you have with your health care provider. Document Revised: 06/19/2018 Document Reviewed: 06/19/2018 Elsevier Patient Education  [place].

## 2019-10-16 NOTE — Addendum Note (Signed)
Addended by: Larey Seat on: 10/16/2019 02:53 PM   Modules accepted: Orders

## 2019-10-16 NOTE — Progress Notes (Signed)
SLEEP MEDICINE CLINIC   Provider:  Larey Seat, M D  Primary Care Physician:  [name], NP   Referring Provider: [name], NP /   Chief Complaint  Patient presents with  . Follow-up    pt alone, rm 10. pt states that her trigeminal neuralgia is doing really well. patient last yr completed SS and was never able to follow through with getting set up with dental device, mainly due to cost.     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  I still wanted her to see a neuro surgeon for possible intervention should this medication have not worked.  She saw Dr. Salomon Fick at [facility] and a PA in his clinic, she did very well and by this time she is considered an active patient should she decide to undergo any kind of gamma knife treatment.  At the time she was listed as having failed gabapentin, Trileptal, Lyrica gabapentin. The second part that she was second part that she was originally referred to for versus taking her sleep care over she had a diagnosis of obstructive sleep apnea she could not sleep however in the home sleep test that she was given years ago at low power, she reported that she snored horribly I performed an attended sleep study on January 20 years 2020 and the patient was diagnosed with mild obstructive sleep apnea and an AHI of 15.5, she also had RDI which is the snoring accentuated arousal at 18.1/h.  REM sleep seems not to have affected her overall apnea frequency supine sleep however was significantly exacerbating the AHI to 52.7/h.  She had 36 minutes total sleep time with an oxygen desaturation.  It was felt that this constellation should be treated with oxygen CPAP.  Please noted that she had a lot of periodic limb movements at night she has  restless legs and is twitching quite a bit. She has a lot of anxiety about the face mask, she tried a dental device with Dr Toy Cookey- and it did not work out.  She is wanting to change to Dr Ron Parker.   Please note that  she started taking some CBD oil, if this helps her high level of anxiety, I am OK with it.   Download CPAP - from; she discontinued CPAP use. Referral to Dr Ron Parker pending.   I will give her written prescription for hydrocodone 5 tabs , PRN flair up of trigeminal neurolagia in case .  RLS , only in bed , not when sitting still. She reports its only happening in bed ,not before and not when sleeping on the sofa? No pain, just a feeling of not getting comfort. No twitching.  I am not sure it's truly RLS, and its not progressive. I will check for ferritin.    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 Carlisle Cater for transfer and continuity of sleep care. Seen 07-17-2018. 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 She has a history of trigeminal Neuralgia since 2012 , following an oral surgery. She has seen Dr. Erling Cruz and did well on trileptal - 300 mg  tid po. She was for 2 years pain free and able to wean. In September she had a severe flair up, was unable to function, to drive, and restarted Trileptol.   She laid in bed for 5 days- took hydrocodone and Advil - and waited for the medication to start working.  Last Friday she had finally a day off pain and she is now able to see me here. She has still hyperesthesia. Dr. Erling Cruz had done 2 MRIs - She never was interested in gamma knife surgery.   2) Dr. Sherren Mocha also reported her RLS to be worked up here in the future.  She had not taken any medications.   3) She also suffers from seasonal allergies, tends to have bronchitis and coughing during the season and she developed pneumonia this year for the first time.  She saw Dr. Sherren Mocha for treatment and is on  the way up, finally can sleep and breath. She suspects she has sleep apnea. She  Had a HST 3 years ago at [employer] [facility] and felt it was not a valid test.  She couldn't sleep the night of the test. She snores horribly,   Chief complaint according to patient :  For today , it's trigeminal neuralgia - plan for the future.   Sleep habits are as follows: Dinnertime is usually between 5 and 6 PM, the patient will spend the evening hours before bedtime doing yard work, household chores, and she may read or watch TV later.  Her bedtime is around 9 PM and she feels very tired already by that time.  She sleeps in a separate bedroom from her husband because of her loud snoring.  She does watch TV in her bedroom otherwise the bedroom would be cool, quiet and dark.  The TV however stays on most of the night. She wakes up frequently moves, tosses and turns she feels too hot in the bed but she reports that she does not have nocturia or at least no more than once at night.  Most recently she had a lot of trouble with coughing and she is just in the process of overcoming pneumonia.  She had taken some hydrocodone which also helps pain but works as a cough suppressant.  In these days she has woken up unrefreshed and unrestored.  He has a dry mouth in the morning, a dull more generalized throbbing headache that she wakes up with but not wake up from. Takes 2 excedrin in AM.  Naps when she can- after lunch naps last never more than 1 hour and make her even more groggy,    Sleep medical history and family sleep history:  No known family members with OSA, mother had dementia and passed at age 72, lung cancer , chain smoking, killed her  father.  Brother is healthy.   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  Patient has never smoked herself but was exposed to passive smoke growing up. She is also not an alcohol consumer, caffeine use coffee drinks iced tea and soda but not coffee.  Up to 3 caffeinated  beverages a day. She works as a Marine scientist at [employer] but never had a night shift duty.  Review of Systems: Out of a complete 14 system review, the patient complains of only the following symptoms, and all other reviewed systems are negative.  trigeminal pain ? From the auricular region to the upper right incisor.    Social History   Socioeconomic History  .  Marital status: Married    Spouse name: Not on file  . Number of children: 3  . Years of education: BSN  . Highest education level: Bachelor's degree (e.g., BA, AB, BS)  Occupational History  . 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 Smoking status: Never Smoker  . Smokeless tobacco: Never Used  Substance and Sexual Activity  . Alcohol use: No    Alcohol/week: 0.0 standard drinks  . Drug use: No  . Sexual activity: Not on file  Other Topics Concern  . Not on file  Social History Narrative   Lives with husband    caffeine 2 a day      10/19/2018:   Lives with husband and 2 golden retrievers on one level   Has three children, one daughter, one son, and one deceased son s/p MVA.   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   Enjoys reading, needlework, painting            Social Determinants of Health   Financial Resource Strain: Low Risk   . Difficulty of Paying Living Expenses: Not hard at all  Food Insecurity: No Food Insecurity  . Worried About Charity fundraiser in the Last Year: Never true  . Ran Out of Food in the Last Year: Never true  Transportation Needs: No Transportation Needs  . Lack of Transportation (Medical): No  . Lack of Transportation (Non-Medical): No  Physical Activity: Insufficiently Active  . Days of Exercise per Week: 2 days  . Minutes of Exercise per Session: 30 min  Stress: No Stress Concern Present  . Feeling of Stress : Not at all  Social  Connections: Unknown  . Frequency of Communication with Friends and Family: More than three times a week  . Frequency of Social Gatherings with Friends and Family: Twice a week  . Attends Religious Services: Not on file  . Active Member of Clubs or Organizations: Not on file  . Attends Archivist Meetings: Not on file  . Marital Status: Married  Human resources officer Violence:   . Fear of Current or Ex-Partner: Not on file  . Emotionally Abused: Not on file  . Physically Abused: Not on file  . Sexually Abused: Not on file    Family History  Problem Relation Age of Onset  . Diabetes Maternal Grandmother   . Crohn's disease Father   . Lung cancer Father   . Breast cancer Mother   . Osteoporosis Mother   . Dementia Mother   . Diabetes Mother   . Hypertension Mother   . Colon cancer Neg Hx   . Esophageal cancer Neg Hx   . Rectal cancer Neg Hx   . Stomach cancer Neg Hx   . Colon polyps Neg Hx     Past Medical History:  Diagnosis Date  . Anxiety    son killed in MVA  . Asthma    as a child  . Basal cell carcinoma of nose   . Colon polyps    2011  . Depression    son killed in [place]  . Diverticulosis of colon    diverticulitits  . Gallstones   . GERD (gastroesophageal reflux disease)   . Hypertension   . Obesity   . Sleep apnea    mild per pt-no tx  . Trigeminal neuralgia     Past  Surgical History:  Procedure Laterality Date  . ABDOMINAL HYSTERECTOMY     1997  . [place]   x 3  . CHOLECYSTECTOMY  2002  . COLONOSCOPY  last 03/15/2016  . ROTATOR CUFF REPAIR Right 2017  . TOTAL ABDOMINAL HYSTERECTOMY W/ BILATERAL SALPINGOOPHORECTOMY  2001  . UPPER GASTROINTESTINAL ENDOSCOPY      Current Outpatient Medications  Medication Sig Dispense Refill  . ciprofloxacin (CIPRO) 500 MG tablet Take 1 tablet (500 mg total) by mouth 2 (two) times daily. 20 tablet 0  . ibuprofen (ADVIL,MOTRIN) 200 MG tablet Take 800 mg by mouth every 6 (six) hours  as needed for headache or moderate pain.     Marland Kitchen lamoTRIgine (LAMICTAL) 25 MG tablet Take 3 tablets (75 mg total) by mouth 2 (two) times daily. 540 tablet 0  . metroNIDAZOLE (FLAGYL) 500 MG tablet 1 by mouth twice a day for diverticulitis 20 tablet 0  . omeprazole (PRILOSEC) 20 MG capsule TAKE 1 CAPSULE (20 MG TOTAL) BY MOUTH DAILY. 90 capsule 0  . Psyllium (METAMUCIL FIBER PO) Take 1 each by mouth daily.     Marland Kitchen triamterene-hydrochlorothiazide (DYAZIDE) 37.5-25 MG capsule TAKE 1 CAPSULE BY MOUTH DAILY. 90 capsule 0  . escitalopram (LEXAPRO) 20 MG tablet Take 1 tablet (20 mg total) by mouth daily. 90 tablet 1   No current facility-administered medications for this visit.    Allergies as of 10/16/2019 - Review Complete 10/16/2019  Allergen Reaction Noted  . Iodine-131 Rash 09/11/2018  . Other Hives, Rash, and Other (See Comments) 12/01/2012    Vitals: BP 102/65   Pulse 76   Temp 98 F (36.7 C)   Ht 5' 3.5" (1.613 m)   Wt 173 lb (78.5 kg)   BMI 30.16 kg/m  Last Weight:  Wt Readings from Last 1 Encounters:  10/16/19 173 lb (78.5 kg)   [id number] mass index is 30.16 kg/m.     Last Height:   Ht Readings from Last 1 Encounters:  10/16/19 5' 3.5" (1.613 m)    Physical exam:  General: The patient is awake, alert and appears not in acute distress. The patient is well groomed. Head: Normocephalic, atraumatic. Neck is supple. Mallampati 4 ,  neck circumference:15.. Nasal airflow patent ,Retrognathia is not seen.  Cardiovascular:  Regular rate and rhythm  without  murmurs or carotid bruit, and without distended neck veins. Respiratory: Lungs are clear to auscultation. Skin:  Without evidence of edema, or rash Trunk: BMI is 30 / The patient's posture is erect . Abdominal obesity. Neurologic exam : The patient is awake and alert, oriented to place and time.   Memory subjective described as intact.   Attention span & concentration ability appears normal.  Speech is fluent, without  dysarthria, dysphonia or aphasia.  Mood and affect are anxious. .  Cranial nerves:  taste and smell are not affected. Pupils are equal and briskly reactive to light. Funduscopic exam without evidence of pallor or edema. Extraocular movements  in vertical and horizontal planes intact and without nystagmus. Visual fields by finger perimetry are intact. Hearing to finger rub intact.  Facial sensation hypersensitive to fine touch.  From the right preauricular region towards the upper right incisor. Facial motor strength is symmetric - and tongue and uvula move midline.  Shoulder shrug was symmetrical.   Motor exam:   Normal tone, muscle bulk and symmetric strength in all extremities. Sensory:  Fine touch, pinprick and vibration were tested in all extremities. Proprioception tested  in the upper extremities was normal. Coordination: Rapid alternating movements in the fingers/hands was normal. Finger-to-nose maneuver  normal without evidence of ataxia, dysmetria or tremor. Gait and station: Patient walks without assistive device and is able unassisted to climb up to the exam table. Strength within normal limits. Stance is stable and normal.  Turns with  4 Steps. Romberg testing is negative. Deep tendon reflexes: in the  upper and lower extremities are symmetric and intact. Babinski maneuver response is downgoing.    Assessment:  After physical and neurologic examination, review of laboratory studies,  Personal review of imaging studies, reports of other /same  Imaging studies, results of polysomnography and / or neurophysiology testing and pre-existing records as far as provided in visit., my assessment is   She has a lot of anxiety about the face mask, she tried a dental device with Dr Toy Cookey- and it did not work out.  She is wanting to change to Dr Ron Parker.   Please note that  she started taking some CBD oil, if this helps her high level of anxiety, I am OK with it.   Download CPAP - from; she  discontinued CPAP use. Referral to Dr Ron Parker pending. I advised her to sleep in non-supine only.   I will give her written prescription for hydrocodone 5 tabs , PRN flair up of trigeminal neurolagia in case .  RLS , only in bed , not when sitting still. She reports its only happening in bed ,not before and not when sleeping on the sofa? No pain, just a feeling of not getting comfort. No twitching.  I am not sure it's truly RLS, and its not progressive. I will check for ferritin.    The patient was advised of the nature of the diagnosed disorder , the treatment options and the  risks for general health and wellness arising from not treating the condition.   I spent more than 23 minutes of face to face time with the patient.  Greater than 50% of time was spent in counseling and coordination of care.  We have discussed the diagnosis and differential and I answered the patient's questions.    Plan:  Treatment plan and additional workup :  Referral to Dr Ron Parker, , Ferritin, TIBC,  Larey Seat, MD [id number], [id number] PM  Certified in Neurology by ABPN Certified in Sleep Medicine by [facility] [address] [place], [place] [address]

## 2019-10-17 LAB — IRON,TIBC AND FERRITIN PANEL
Ferritin: 261 ng/mL — ABNORMAL HIGH (ref 15–150)
Iron Saturation: 26 % (ref 15–55)
Iron: 75 ug/dL (ref 27–139)
Total Iron Binding Capacity: 289 ug/dL (ref 250–450)
UIBC: 214 ug/dL (ref 118–369)

## 2019-10-17 NOTE — Progress Notes (Signed)
High level of ferritin. No evidence of iron deficiency.

## 2019-10-18 ENCOUNTER — Encounter: Payer: Self-pay | Admitting: Neurology

## 2019-10-19 ENCOUNTER — Telehealth: Payer: Self-pay | Admitting: Neurology

## 2019-10-19 NOTE — Telephone Encounter (Signed)
Called the patient and advised of the normal lab results. Advised the ferritin level was elevated but that she was not concerned with that.

## 2019-10-19 NOTE — Telephone Encounter (Signed)
-----   Message from Larey Seat, MD sent at 10/17/2019  4:42 PM EST ----- High level of ferritin. No evidence of iron deficiency.

## 2019-10-22 ENCOUNTER — Other Ambulatory Visit: Payer: Self-pay | Admitting: Neurology

## 2019-10-22 MED ORDER — HYDROCODONE-ACETAMINOPHEN 10-325 MG PO TABS: 1.0000 | ORAL_TABLET | Freq: Four times a day (QID) | ORAL | 0 refills | Status: DC | PRN

## 2019-10-22 MED FILL — HYDROCODON-APAP 10-325: 10-325 | 2 days supply | Qty: 5 | Fill #0

## 2019-10-23 ENCOUNTER — Ambulatory Visit: Payer: Medicare Other | Attending: Internal Medicine

## 2019-10-23 DIAGNOSIS — Z23 Encounter for immunization: Secondary | ICD-10-CM | POA: Insufficient documentation

## 2019-10-23 NOTE — Progress Notes (Signed)
   Covid-19 Vaccination Clinic  Name:  Krystal Monroe    MRN: [id number] DOB: 18-Mar-1951  10/23/2019  [name] was observed post Covid-19 immunization for 15 minutes without incident. She was provided with Vaccine Information Sheet and instruction to access the V-Safe system.   [name] was instructed to call 911 with any severe reactions post vaccine: Marland Kitchen Difficulty breathing  . Swelling of face and throat  . A fast heartbeat  . A bad rash all over body  . Dizziness and weakness   Immunizations Administered    Name Date Dose VIS Date Route   Pfizer COVID-19 Vaccine 10/23/2019 11:21 AM 0.3 mL 07/27/2019 Intramuscular   Manufacturer: [place]   Lot: [id number]   [place]: [id number]

## 2019-12-12 ENCOUNTER — Telehealth: Payer: Self-pay | Admitting: Neurology

## 2019-12-12 NOTE — Telephone Encounter (Signed)
Received a note from Dr Ron Parker office stating the patient has started her oral appliance for snoring and OSA.

## 2019-12-18 ENCOUNTER — Other Ambulatory Visit: Payer: Self-pay | Admitting: Neurology

## 2019-12-18 ENCOUNTER — Other Ambulatory Visit: Payer: Self-pay | Admitting: Adult Health

## 2019-12-18 DIAGNOSIS — K2100 Gastro-esophageal reflux disease with esophagitis, without bleeding: Secondary | ICD-10-CM

## 2019-12-18 DIAGNOSIS — I10 Essential (primary) hypertension: Secondary | ICD-10-CM

## 2019-12-18 MED FILL — ESCITALOPRAM 20 MG TABLET: 20 | 90 days supply | Qty: 90 | Fill #1

## 2019-12-18 MED FILL — lamoTRIgine 25 MG TABS: 25 | 90 days supply | Qty: 540 | Fill #0

## 2019-12-19 MED FILL — TRIAMTERENE/HCTZ 37.5/25 CP: 37.5-25 | 30 days supply | Qty: 30 | Fill #0

## 2019-12-19 MED FILL — OMEPRAZOLE DR 20 MG CAPSULE: 20 | 30 days supply | Qty: 30 | Fill #0

## 2019-12-19 NOTE — Telephone Encounter (Signed)
SENT TO THE PHARMACY BY E-SCRIBE. 

## 2020-01-22 ENCOUNTER — Other Ambulatory Visit: Payer: Self-pay | Admitting: Adult Health

## 2020-01-22 DIAGNOSIS — I10 Essential (primary) hypertension: Secondary | ICD-10-CM

## 2020-01-23 ENCOUNTER — Encounter: Payer: Self-pay | Admitting: Adult Health

## 2020-01-23 DIAGNOSIS — I10 Essential (primary) hypertension: Secondary | ICD-10-CM

## 2020-01-23 MED ORDER — TRIAMTERENE-HCTZ 37.5-25 MG PO CAPS: 1.0000 | ORAL_CAPSULE | Freq: Every day | ORAL | 1 refills | Status: DC

## 2020-01-23 NOTE — Telephone Encounter (Signed)
DENIED.  PT NEEDS TO BE SCHEDULED FOR CPX.

## 2020-02-13 ENCOUNTER — Other Ambulatory Visit: Payer: Self-pay | Admitting: Adult Health

## 2020-02-13 DIAGNOSIS — K2100 Gastro-esophageal reflux disease with esophagitis, without bleeding: Secondary | ICD-10-CM

## 2020-02-14 MED FILL — OMEPRAZOLE DR 20 MG CAPSULE: 20 | 90 days supply | Qty: 90 | Fill #0

## 2020-02-14 NOTE — Telephone Encounter (Signed)
Sent to the pharmacy by e-scribe. 

## 2020-03-19 ENCOUNTER — Other Ambulatory Visit: Payer: Self-pay | Admitting: Neurology

## 2020-03-19 MED FILL — lamoTRIgine 25 MG TABS: 25 | 90 days supply | Qty: 540 | Fill #0

## 2020-04-28 MED FILL — TRIAMTERENE/HCTZ 37.5/25 CP: 37.5-25 | 90 days supply | Qty: 90 | Fill #1

## 2020-05-06 ENCOUNTER — Encounter: Payer: Self-pay | Admitting: Neurology

## 2020-05-06 NOTE — Patient Instructions (Addendum)
We will continue lamotrigine 75mg  twice daily. Please call me as soon as you are concerned about a flare. We will call in a steroid taper for you and hydrocodone 5 tablets.   Please let me know if you wish to get a referral to pain management for consideration of nerve blocks. You can also consider inspire device if you wish. All you have to do is call me! Continue to work on sleep hygiene.    Follow up with [place] in 1 year, sooner if needed   Trigeminal Neuralgia  Trigeminal neuralgia is a nerve disorder that causes severe pain on one side of the face. The pain may last from a few seconds to several minutes. The pain is usually only on one side of the face. Symptoms may occur for days, weeks, or months and then go away for months or years. The pain may return and be worse than before. What are the causes? This condition is caused by damage or pressure to a nerve in the head that is called the trigeminal nerve. An attack can be triggered by:  Talking.  Chewing.  Putting on makeup.  Washing your face.  Shaving your face.  Brushing your teeth.  Touching your face. What increases the risk? You are more likely to develop this condition if you:  Are 57 years of age or older.  Are female. What are the signs or symptoms? The main symptom of this condition is severe pain in the:  Jaw.  Lips.  Eyes.  Nose.  Scalp.  Forehead.  Face. The pain may be:  Intense.  Stabbing.  Electric.  Shock-like. How is this diagnosed? This condition is diagnosed with a physical exam. A CT scan or an MRI may be done to rule out other conditions that can cause facial pain. How is this treated? This condition may be treated with:  Avoiding the things that trigger your symptoms.  Taking prescription medicines (anticonvulsants).  Having surgery. This may be done in severe cases if other medical treatment does not provide relief.  Having procedures such as ablation, thermal, or  radiation therapy. It may take up to one month for treatment to start relieving the pain. Follow these instructions at home: Managing pain  Learn as much as you can about how to manage your pain. Ask your health care provider if a pain specialist would be helpful.  Consider talking with a mental health care provider (psychologist) about how to cope with the pain.  Consider joining a pain support group. General instructions  Take over-the-counter and prescription medicines only as told by your health care provider.  Avoid the things that trigger your symptoms. It may help to: ? Chew on the unaffected side of your mouth. ? Avoid touching your face. ? Avoid blasts of hot or cold air.  Follow your treatment plan as told by your health care provider. This may include: ? Cognitive or behavioral therapy. ? Gentle, regular exercise. ? Meditation or yoga. ? Aromatherapy.  Keep all follow-up visits as told by your health care provider. You may need to be monitored closely to make sure treatment is working well for you. Where to find more information  Facial Pain Association: fpa-support.org Contact a health care provider if:  Your medicine is not helping your symptoms.  You have side effects from the medicine used for treatment.  You develop new, unexplained symptoms, such as: ? Double vision. ? Facial weakness. ? Facial numbness. ? Changes in hearing or balance.  You  feel depressed. Get help right away if:  Your pain is severe and is not getting better.  You develop suicidal thoughts. If you ever feel like you may hurt yourself or others, or have thoughts about taking your own life, get help right away. You can go to your nearest emergency department or call:  Your local emergency services (911 in the [place]  A suicide crisis helpline, such as the [facility] at 438-540-2380. This is open 24 hours a day. Summary  Trigeminal neuralgia is a nerve  disorder that causes severe pain on one side of the face. The pain may last from a few seconds to several minutes.  This condition is caused by damage or pressure to a nerve in the head that is called the trigeminal nerve.  Treatment may include avoiding the things that trigger your symptoms, taking medicines, or having surgery or procedures. It may take up to one month for treatment to start relieving the pain.  Avoid the things that trigger your symptoms.  Keep all follow-up visits as told by your health care provider. You may need to be monitored closely to make sure treatment is working well for you. This information is not intended to replace advice given to you by your health care provider. Make sure you discuss any questions you have with your health care provider. Document Revised: 06/19/2018 Document Reviewed: 06/19/2018 Elsevier Patient Education  [place].   Restless Legs Syndrome Restless legs syndrome is a condition that causes uncomfortable feelings or sensations in the legs, especially while sitting or lying down. The sensations usually cause an overwhelming urge to move the legs. The arms can also sometimes be affected. The condition can range from mild to severe. The symptoms often interfere with a person's ability to sleep. What are the causes? The cause of this condition is not known. What increases the risk? The following factors may make you more likely to develop this condition:  Being older than 50.  Pregnancy.  Being a woman. In general, the condition is more common in women than in men.  A family history of the condition.  Having iron deficiency.  Overuse of caffeine, nicotine, or alcohol.  Certain medical conditions, such as kidney disease, Parkinson's disease, or nerve damage.  Certain medicines, such as those for high blood pressure, nausea, colds, allergies, depression, and some heart conditions. What are the signs or symptoms? The main  symptom of this condition is uncomfortable sensations in the legs, such as:  Pulling.  Tingling.  Prickling.  Throbbing.  Crawling.  Burning. Usually, the sensations:  Affect both sides of the body.  Are worse when you sit or lie down.  Are worse at night. These may wake you up or make it difficult to fall asleep.  Make you have a strong urge to move your legs.  Are temporarily relieved by moving your legs. The arms can also be affected, but this is rare. People who have this condition often have tiredness during the day because of their lack of sleep at night. How is this diagnosed? This condition may be diagnosed based on:  Your symptoms.  Blood tests. In some cases, you may be monitored in a sleep lab by a specialist (a sleep study). This can detect any disruptions in your sleep. How is this treated? This condition is treated by managing the symptoms. This may include:  Lifestyle changes, such as exercising, using relaxation techniques, and avoiding caffeine, alcohol, or tobacco.  Medicines. Anti-seizure  medicines may be tried first. Follow these instructions at home:     General instructions  Take over-the-counter and prescription medicines only as told by your health care provider.  Use methods to help relieve the uncomfortable sensations, such as: ? Massaging your legs. ? Walking or stretching. ? Taking a cold or hot bath.  Keep all follow-up visits as told by your health care provider. This is important. Lifestyle  Practice good sleep habits. For example, go to bed and get up at the same time every day. Most adults should get 7-9 hours of sleep each night.  Exercise regularly. Try to get at least 30 minutes of exercise most days of the week.  Practice ways of relaxing, such as yoga or meditation.  Avoid caffeine and alcohol.  Do not use any products that contain nicotine or tobacco, such as cigarettes and e-cigarettes. If you need help quitting,  ask your health care provider. Contact a health care provider if:  Your symptoms get worse or they do not improve with treatment. Summary  Restless legs syndrome is a condition that causes uncomfortable feelings or sensations in the legs, especially while sitting or lying down.  The symptoms often interfere with a person's ability to sleep.  This condition is treated by managing the symptoms. You may need to make lifestyle changes or take medicines. This information is not intended to replace advice given to you by your health care provider. Make sure you discuss any questions you have with your health care provider. Document Revised: 08/22/2017 Document Reviewed: 08/22/2017 Elsevier Patient Education  [place].

## 2020-05-06 NOTE — Progress Notes (Signed)
PATIENT: Krystal Monroe DOB: 1951-01-31  REASON FOR VISIT: follow up HISTORY FROM: patient  Chief Complaint  Patient presents with  . Follow-up    F/U on dental device for OSA. Had to stop using it due to it triggering her facial pain.   . room 9    alone      HISTORY OF PRESENT ILLNESS: Today 05/07/20 Krystal Monroe is a 69 y.o. female here today for follow up for TN. She continues lamotrigine 75mg  BID. She has a prn rx for hydrocodone 10/325 (5 tablets) for breakthrough TN pain. She is doing well, overall. She did have a significant bout with trigeminal pain in July following initiation of oral appliance for OSA management. She reports pain was so severe that she was bed bound for a week. She did use hydrocodone that helped some. She has been totally pain free since August. Pain usually right sided.   She was referred to Dr Ron Parker for OSA unable to tolerate CPAP or original dental device with Dr Toy Cookey. She was using oral appliance but reports this causes exacerbations of pain. RLS stable.   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  I still wanted her to see a neuro surgeon for possible intervention should this medication have not worked.  She saw Dr. Salomon Fick at [facility] and a PA in his clinic, she did very well and by this time she is considered an active patient should she decide to undergo any kind of gamma knife treatment.  At the time she was listed as having failed gabapentin, Trileptal, Lyrica gabapentin. The second part that she was second part that she was originally referred to for versus taking her sleep care over she had a diagnosis of obstructive sleep apnea she could not sleep however in the home  sleep test that she was given years ago at [facility], she reported that she snored horribly I performed an attended sleep study on January 20 years 2020 and the patient was diagnosed with mild obstructive sleep apnea and an AHI of 15.5, she also had RDI which is the snoring accentuated arousal at 18.1/h.  REM sleep seems not to have affected her overall apnea frequency supine sleep however was significantly exacerbating the AHI to 52.7/h.  She had 36 minutes total sleep time with an oxygen desaturation.  It was felt that this constellation should be treated with oxygen CPAP.  Please noted that she had a lot of periodic limb movements at night she has restless legs and is twitching quite a bit. She has a lot of anxiety about the face mask, she tried a dental device with Dr Toy Cookey- and it did not work out.  She is wanting to change to Dr Ron Parker.   Please note that  she started taking some CBD oil, if this helps her high level of anxiety, I am OK with it.   Download CPAP - from; she discontinued CPAP use. Referral to Dr Ron Parker pending.   I will give her written prescription for hydrocodone 5 tabs , PRN flair up of trigeminal neurolagia in case .  RLS , only in bed , not when sitting still.  She reports its only happening in bed ,not before and not when sleeping on the sofa? No pain, just a feeling of not getting comfort. No twitching.  I am not sure it's truly RLS, and its not progressive. I will check for ferritin.    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 Carlisle Cater for transfer and continuity of sleep care. Seen 07-17-2018. 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 She has a history of trigeminal Neuralgia since 2012 , following an oral surgery. She has seen Dr. Erling Cruz and did well on trileptal - 300 mg tid po. She was for 2 years pain free and able to wean. In September she had a severe flair up, was  unable to function, to drive, and restarted Trileptol.   She laid in bed for 5 days- took hydrocodone and Advil - and waited for the medication to start working.  Last Friday she had finally a day off pain and she is now able to see me here. She has still hyperesthesia. Dr. Erling Cruz had done 2 MRIs - She never was interested in gamma knife surgery.   2) Dr. Sherren Mocha also reported her RLS to be worked up here in the future.  She had not taken any medications.   3) She also suffers from seasonal allergies, tends to have bronchitis and coughing during the season and she developed pneumonia this year for the first time.  She saw Dr. Sherren Mocha for treatment and is on the way up, finally can sleep and breath. She suspects she has sleep apnea. She  Had a HST 3 years ago at [employer] and felt it was not a valid test.  She couldn't sleep the night of the test. She snores horribly,   Chief complaint according to patient :  For today , it's trigeminal neuralgia - plan for the future.   Sleep habits are as follows: Dinnertime is usually between 5 and 6 PM, the patient will spend the evening hours before bedtime doing yard work, household chores, and she may read or watch TV later.  Her bedtime is around 9 PM and she feels very tired already by that time.  She sleeps in a separate bedroom from her husband because of her loud snoring.  She does watch TV in her bedroom otherwise the bedroom would be cool, quiet and dark.  The TV however stays on most of the night. She wakes up frequently moves, tosses and turns she feels too hot in the bed but she reports that she does not have nocturia or at least no more than once at night.  Most recently she had a lot of trouble with coughing and she is just in the process of overcoming pneumonia.  She had taken some hydrocodone which also helps pain but works as a cough suppressant.  In these days she has woken up unrefreshed and unrestored.  He has a dry mouth in the morning, a dull more  generalized throbbing headache that she wakes up with but not wake up from. Takes 2 excedrin in AM.  Naps when she can- after lunch naps last never more than 1 hour and make her even more groggy,    Sleep medical history and family sleep history:  No known family members with OSA, mother had dementia and passed at age 63, lung cancer , chain smoking, killed her  father.  Brother is healthy.   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  Patient has never smoked herself but was exposed to passive smoke growing up. She is also not an alcohol consumer, caffeine use coffee drinks iced tea and soda but not coffee.  Up to 3 caffeinated beverages a day. She works as a Marine scientist at [employer] but never had a night shift duty.   REVIEW OF SYSTEMS: Out of a complete 14 system review of symptoms, the patient complains only of the following symptoms, facial pain, restless legs, insomnia and all other reviewed systems are negative.  ALLERGIES: Allergies  Allergen Reactions  . Iodine-131 Rash  . Other Hives, Rash and Other (See Comments)    Magnavist Contrast    HOME MEDICATIONS: Outpatient Medications Prior to Visit  Medication Sig Dispense Refill  . ciprofloxacin (CIPRO) 500 MG tablet Take 1 tablet (500 mg total) by mouth 2 (two) times daily. 20 tablet 0  . ibuprofen (ADVIL,MOTRIN) 200 MG tablet Take 800 mg by mouth every 6 (six) hours as needed for headache or moderate pain.     Marland Kitchen lamoTRIgine (LAMICTAL) 25 MG tablet TAKE 3 TABLETS (75 MG TOTAL) BY MOUTH 2 (TWO) TIMES DAILY. 540 tablet 1  . metroNIDAZOLE (FLAGYL) 500 MG tablet 1 by mouth twice a day for diverticulitis 20 tablet 0  . omeprazole (PRILOSEC) 20 MG capsule Take 1 capsule (20 mg total) by mouth daily. 90 capsule 1  . Psyllium (METAMUCIL FIBER PO) Take 1 each by mouth daily.     Marland Kitchen triamterene-hydrochlorothiazide (DYAZIDE) 37.5-25 MG capsule Take 1 each (1 capsule total) by mouth daily. **DUE FOR  YEARLY PHYSICAL** 90 capsule 1  . HYDROcodone-acetaminophen (NORCO) 10-325 MG tablet Take 1 tablet by mouth every 6 (six) hours as needed for severe pain (trigine). 5 tablet 0  . escitalopram (LEXAPRO) 20 MG tablet Take 1 tablet (20 mg total) by mouth daily. 90 tablet 1   No facility-administered medications prior to visit.    PAST MEDICAL HISTORY: Past Medical History:  Diagnosis Date  . Anxiety    son killed in MVA  . Asthma    as a child  . Basal cell carcinoma of nose   . Colon polyps    2011  . Depression    son killed in [place]  . Diverticulosis of colon    diverticulitits  . Gallstones   . GERD (gastroesophageal reflux disease)   . Hypertension   . Obesity   . Sleep apnea    mild per pt-no tx  . Trigeminal neuralgia     PAST SURGICAL HISTORY: Past Surgical History:  Procedure Laterality Date  . ABDOMINAL HYSTERECTOMY     1997  . [place]   x 3  . CHOLECYSTECTOMY  2002  . COLONOSCOPY  last 03/15/2016  . ROTATOR CUFF REPAIR Right 2017  . TOTAL ABDOMINAL HYSTERECTOMY W/ BILATERAL SALPINGOOPHORECTOMY  2001  . UPPER GASTROINTESTINAL ENDOSCOPY      FAMILY HISTORY: Family History  Problem Relation Age of Onset  . Diabetes Maternal Grandmother   . Crohn's disease Father   . Lung cancer Father   . Breast cancer Mother   . Osteoporosis Mother   . Dementia Mother   . Diabetes Mother   . Hypertension Mother   . Colon cancer Neg Hx   . Esophageal cancer Neg Hx   . Rectal cancer Neg Hx   . Stomach cancer Neg Hx   . Colon  polyps Neg Hx     SOCIAL HISTORY: Social History   Socioeconomic History  . Marital status: Married    Spouse name: Not on file  . Number of children: 3  . Years of education: BSN  . Highest education level: Bachelor's degree (e.g., BA, AB, BS)  Occupational History  . 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 Smoking status: Never Smoker  . Smokeless tobacco:  Never Used  Vaping Use  . Vaping Use: Never used  Substance and Sexual Activity  . Alcohol use: No    Alcohol/week: 0.0 standard drinks  . Drug use: No  . Sexual activity: Not on file  Other Topics Concern  . Not on file  Social History Narrative   Lives with husband    caffeine 2 a day      10/19/2018:   Lives with husband and 2 golden retrievers on one level   Has three children, one daughter, one son, and one deceased son s/p MVA.   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   Enjoys reading, needlework, painting            Social Determinants of Health   Financial Resource Strain:   . Difficulty of Paying Living Expenses: Not on file  Food Insecurity:   . Worried About Charity fundraiser in the Last Year: Not on file  . Ran Out of Food in the Last Year: Not on file  Transportation Needs:   . Lack of Transportation (Medical): Not on file  . Lack of Transportation (Non-Medical): Not on file  Physical Activity:   . Days of Exercise per Week: Not on file  . Minutes of Exercise per Session: Not on file  Stress:   . Feeling of Stress : Not on file  Social Connections:   . Frequency of Communication with Friends and Family: Not on file  . Frequency of Social Gatherings with Friends and Family: Not on file  . Attends Religious Services: Not on file  . Active Member of Clubs or Organizations: Not on file  . Attends Archivist Meetings: Not on file  . Marital Status: Not on file  Intimate Partner Violence:   . Fear of Current or Ex-Partner: Not on file  . Emotionally Abused: Not on file  . Physically Abused: Not on file  . Sexually Abused: Not on file      PHYSICAL EXAM  Vitals:   05/07/20 0806  BP: 120/72  Pulse: 75  Weight: 174 lb 9.6 oz (79.2 kg)  Height: 5' 3.5" (1.613 m)   Body mass index is 30.44 kg/m.  Generalized: Well developed, in no acute distress  Cardiology: normal rate  and rhythm, no murmur noted Respiratory: clear to auscultation bilaterally  Neurological examination  Mentation: Alert oriented to time, place, history taking. Follows all commands speech and language fluent Cranial nerve II-XII: Pupils were equal round reactive to light. Extraocular movements were full, visual field were full  Motor: The motor testing reveals 5 over 5 strength of all 4 extremities. Good symmetric motor tone is noted throughout.  Gait and station: Gait is normal.    DIAGNOSTIC DATA (LABS, IMAGING, TESTING) - I reviewed patient records, labs, notes, testing and imaging myself where available.  MMSE - Mini Mental State Exam 12/28/2016  Not completed: (No Data)     Lab Results  Component Value Date   WBC 7.7 06/14/2019   HGB 13.2 06/14/2019   HCT 38.9 06/14/2019   MCV 85.4 06/14/2019   PLT 234.0 06/14/2019      Component Value Date/Time   NA 139 06/14/2019 0847   NA 125 (L) 06/15/2018 1520   K 3.8 06/14/2019 0847   CL 101 06/14/2019 0847   CO2 29 06/14/2019 0847   GLUCOSE 113 (H) 06/14/2019 0847   BUN 14 06/14/2019 0847   BUN 10 06/15/2018 1520   CREATININE 0.77 06/14/2019 0847   CALCIUM 9.5 06/14/2019 0847   PROT 6.9 06/14/2019 0847   ALBUMIN 4.6 06/14/2019 0847   AST 20 06/14/2019 0847   ALT 25 06/14/2019 0847   ALKPHOS 58 06/14/2019 0847   BILITOT 0.7 06/14/2019 0847   GFRNONAA 94 06/15/2018 1520   GFRAA 109 06/15/2018 1520   Lab Results  Component Value Date   CHOL 179 06/14/2019   HDL 41.90 06/14/2019   LDLCALC 125 (H) 09/26/2015   LDLDIRECT 94.0 06/14/2019   TRIG 204.0 (H) 06/14/2019   CHOLHDL 4 06/14/2019   No results found for: HGBA1C No results found for: VITAMINB12 Lab Results  Component Value Date   TSH 1.41 06/14/2019       ASSESSMENT AND PLAN 69 y.o. year old female  has a past medical history of Anxiety, Asthma, Basal cell carcinoma of nose, Colon polyps, Depression, Diverticulosis of colon, Gallstones, GERD (gastroesophageal  reflux disease), Hypertension, Obesity, Sleep apnea, and Trigeminal neuralgia. here with     ICD-10-CM   1. Right trigeminal neuralgia  G50.0 methylPREDNISolone (MEDROL DOSEPAK) 4 MG TBPK tablet    HYDROcodone-acetaminophen (NORCO) 10-325 MG tablet  2. Obstructive sleep apnea  G47.33   3. RLS (restless legs syndrome)  G25.81     Mycah is doing well, today. She will continue lamotrigine 75mg  twice daily. We will plan to use medrol taper pack and short course of hydrocodone for acute trigeminal flares. I have called in an prescription for both that she may have on hand for acute flare.  She is aware that she can call me at any time for worsening symptoms. We have discussed possible referral to pain mangement for consideration of RFA or nerve blocks as well. We have also discussed considering an Inspire device for management of OSA. She is not interested at this time. She may consider trying a different mask with CPAP in future. Healthy lifestyle habits encouraged. Sleep hygiene reviewed. She will follow up in 1 year, sooner if needed. She verbalizes understanding and agreement with this plan.    No orders of the defined types were placed in this encounter.    Meds ordered this encounter  Medications  . methylPREDNISolone (MEDROL DOSEPAK) 4 MG TBPK tablet    Sig: Taper pack as directed    Dispense:  1 each    Refill:  0    Order Specific Question:   Supervising Provider    Answer:   Melvenia Beam [id number]  . HYDROcodone-acetaminophen (NORCO) 10-325 MG tablet    Sig: Take 1 tablet by mouth every 6 (six) hours as needed for severe pain (trigine).    Dispense:  5 tablet    Refill:  0    Order Specific Question:   Supervising Provider    Answer:   Melvenia Beam [id number]      I spent 25 minutes with the patient. 50% of this time was spent counseling and educating patient on plan of care and medications.  Krystal Presto, FNP-C 05/07/2020, 8:52 AM [facility] [address], [place], [place] [address] [phone]

## 2020-05-07 ENCOUNTER — Telehealth: Payer: Self-pay | Admitting: Adult Health

## 2020-05-07 ENCOUNTER — Other Ambulatory Visit: Payer: Self-pay

## 2020-05-07 ENCOUNTER — Encounter: Payer: Self-pay | Admitting: Family Medicine

## 2020-05-07 ENCOUNTER — Ambulatory Visit (INDEPENDENT_AMBULATORY_CARE_PROVIDER_SITE_OTHER): Payer: Medicare Other | Admitting: Family Medicine

## 2020-05-07 VITALS — BP 120/72 | HR 75 | Ht 63.5 in | Wt 174.6 lb

## 2020-05-07 DIAGNOSIS — G500 Trigeminal neuralgia: Secondary | ICD-10-CM

## 2020-05-07 DIAGNOSIS — G2581 Restless legs syndrome: Secondary | ICD-10-CM

## 2020-05-07 DIAGNOSIS — G4733 Obstructive sleep apnea (adult) (pediatric): Secondary | ICD-10-CM

## 2020-05-07 MED ORDER — HYDROCODONE-ACETAMINOPHEN 10-325 MG PO TABS: 1.0000 | ORAL_TABLET | Freq: Four times a day (QID) | ORAL | 0 refills | Status: DC | PRN

## 2020-05-07 MED ORDER — METHYLPREDNISOLONE 4 MG PO TBPK: ORAL_TABLET | ORAL | 0 refills | Status: DC

## 2020-05-07 MED FILL — METHYLPREDNISOLONE 4 MG TAB: 4 | 6 days supply | Qty: 21 | Fill #0

## 2020-05-07 MED FILL — HYDROCODON-APAP 10-325: 10-325 | 1 days supply | Qty: 5 | Fill #0

## 2020-05-07 NOTE — Progress Notes (Signed)
  Chronic Care Management   Note  05/07/2020 Name: Krystal Monroe MRN: [id number] DOB: 09-22-1950  Krystal Monroe is a 69 y.o. year old female who is a primary care patient of [name], NP. I reached out to [employer] by phone today in response to a referral sent by Krystal Monroe PCP, [name], NP.   Krystal Monroe was given information about Chronic Care Management services today including:  1. CCM service includes personalized support from designated clinical staff supervised by her physician, including individualized plan of care and coordination with other care providers 2. 24/7 contact phone numbers for assistance for urgent and routine care needs. 3. Service will only be billed when office clinical staff spend 20 minutes or more in a month to coordinate care. 4. Only one practitioner may furnish and bill the service in a calendar month. 5. The patient may stop CCM services at any time (effective at the end of the month) by phone call to the office staff.   Patient agreed to services and verbal consent obtained.   Follow up plan:   Carley Perdue [facility] Scheduler

## 2020-05-19 NOTE — Chronic Care Management (AMB) (Signed)
Chronic Care Management Pharmacy  Name: Krystal Monroe  MRN: [id number] DOB: [birth year]   Chief Complaint/ HPI  Krystal Monroe,  69 y.o. , female presents for their Initial CCM visit with the clinical pharmacist In office.  PCP : Krystal Peng, NP Patient Care Team: Krystal Peng, NP as PCP - General (Family Medicine) Monroe, Krystal Partridge, MD as Consulting Physician (Neurology) Krystal Schmidt, MD as Consulting Physician (Ophthalmology) Krystal Monroe, [facility] as Pharmacist (Pharmacist)  Their chronic conditions include: Hypertension, GERD, Depression, Osteopenia and Trigeminal Neuralgia and Diverticulitis   Office Visits: 08/31/19: Patient presented to Krystal Peng, NP for follow-up. Patient with chest tightness, EKG and Chest X-Ray ordered.   Consult Visit: 05/07/20: Patient presented to Krystal Presto, NP (Neuology) for Right trigeminal neuralgia. Patient started on methylprednisolone dosepack.  10/16/19: Patient presented to Krystal Monroe (Neuology) for follow-up.    Allergies  Allergen Reactions  . Iodine-131 Rash  . Other Hives, Rash and Other (See Comments)    Magnavist Contrast    Medications: Outpatient Encounter Medications as of 05/21/2020  Medication Sig Note  . lamoTRIgine (LAMICTAL) 25 MG tablet TAKE 3 TABLETS (75 MG TOTAL) BY MOUTH 2 (TWO) TIMES DAILY.   Marland Kitchen triamterene-hydrochlorothiazide (DYAZIDE) 37.5-25 MG capsule Take 1 each (1 capsule total) by mouth daily. **DUE FOR YEARLY PHYSICAL**   . ciprofloxacin (CIPRO) 500 MG tablet Take 1 tablet (500 mg total) by mouth 2 (two) times daily. (Patient not taking: Reported on 05/21/2020)   . escitalopram (LEXAPRO) 20 MG tablet Take 1 tablet (20 mg total) by mouth daily.   Marland Kitchen HYDROcodone-acetaminophen (NORCO) 10-325 MG tablet Take 1 tablet by mouth every 6 (six) hours as needed for severe pain (trigine). (Patient not taking: Reported on 05/21/2020)   . ibuprofen (ADVIL,MOTRIN) 200 MG tablet Take 800 mg by mouth every 6 (six) hours  as needed for headache or moderate pain.  (Patient not taking: Reported on 05/21/2020)   . methylPREDNISolone (MEDROL DOSEPAK) 4 MG TBPK tablet Taper pack as directed (Patient not taking: Reported on 05/21/2020)   . metroNIDAZOLE (FLAGYL) 500 MG tablet 1 by mouth twice a day for diverticulitis (Patient not taking: Reported on 05/21/2020)   . omeprazole (PRILOSEC) 20 MG capsule Take 1 capsule (20 mg total) by mouth daily.   . Psyllium (METAMUCIL FIBER PO) Take 1 each by mouth daily.  (Patient not taking: Reported on 05/21/2020) 06/15/2018: As needed   No facility-administered encounter medications on file as of 05/21/2020.    Wt Readings from Last 3 Encounters:  05/07/20 174 lb 9.6 oz (79.2 kg)  10/16/19 173 lb (78.5 kg)  08/31/19 176 lb (79.8 kg)    Current Diagnosis/Assessment:  SDOH Interventions     Most Recent Value  SDOH Interventions  Financial Strain Interventions Intervention Not Indicated  Transportation Interventions Intervention Not Indicated      Goals Addressed   None    Hypertension   BP goal is:  <130/80  Office blood pressures are  BP Readings from Last 3 Encounters:  05/07/20 120/72  10/16/19 102/65  08/31/19 132/68   Patient checks BP at home 3-5x per week Patient home BP readings are ranging: 235-573 systolics   Patient has failed these meds in the past: n/a Patient is currently controlled on the following medications:  . Triamterene-HCTZ 37.5-25 mg daily   We discussed diet and exercise extensively  Plan  Continue current medications   GERD   Hiatal Hernia   Patient denies dysphagia, heartburn or nausea. Expresses understanding to  avoid triggers such as tomato sauce and sweets.  Currently controlled on: . Omeprazole 20 mg daily   Symptoms well controlled for the most part. Patient is interested in trying to stop this medication due to concerns of vitamin depletion. When she has decreased to one tablet every other day she did alright but  after ~1 week did experience a resurgence in GERD symptoms. She is guilty of eating food that triggers her reflux and she is aware of the importance of minimizing the frequency of these foods/drinks to reduce her symptoms.  Plan   Recommend decreasing omeprazole 20 mg to every other day.  Recommend keeping Tums or Pepcid 20 mg PRN on hand for rebound symptoms.  Recommend strict adherence to dietary restrictions   Needs new Rx   Depression / Anxiety   PHQ9 Score:  PHQ9 SCORE ONLY 10/19/2018 04/18/2018 01/17/2017  PHQ-9 Total Score 0 0 0   GAD7 Score: No flowsheet data found.  Patient has failed these meds in past: n/a Patient is currently controlled on the following medications:  . Escitalopram 20 mg daily (takes 1/2 tablet)   We discussed:  Taking for 20+ Years, has struggled with trying to come off entirely.   Plan  Continue current medications  Trigeminal Neuralgia   Managed by Krystal Monroe   Patient has failed these meds in past: Gabapentin, Trileptal, Lyrica Patient is currently controlled on the following medications:  . Ibuprofen 200 mg 4 tabs q6hr PRN (not using) . Hydrocodone-Acetaminophen 10-325 mg q6hr PRN (not using) . Lamotrigine 25 mg 3 tabs twice daily   We discussed:  Has not had any flare ups recently.  Plan  Continue current medications  Osteopenia   Last DEXA Scan: 11/21/16   T-Score femoral neck: -2.0 (L)  T-Score total hip: -1.0  T-Score lumbar spine: -1.3  T-Score forearm radius: n/a  10-year probability of major osteoporotic fracture: 18.9%  10-year probability of hip fracture: 2.6%  No results found for: VD25OH   Patient is not a candidate for pharmacologic treatment  Patient has failed these meds in past: n/a Patient is currently controlled on the following medications:  Marland Kitchen Vitamin D 1000 units twice daily   We discussed:  Recommend [phone] units of vitamin D daily. Recommend 1200 mg of calcium daily from dietary and supplemental sources.  Patient has had problems with constipation when taking calcium before.   Plan  Recommend checking Vitamin D level  Recommend rechecking bone density  Recommend starting calcium complete (Calcium + Mag) to help minimize risk of constipation.    Restless Legs   Patient has failed these meds in past: n/a Patient is currently uncontrolled on the following medications:  . None  We discussed:  Ongoing for years. Sometimes her symptoms are manageable and other times she will not be able to sleep because of her legs. Patient wondering if her restless legs could be due to a magnesium or vitamin b12 deficiency.   Plan  Recommend rechecking vitamin B12 and magnesium levels   Misc / OTC   . Psyllium daily PRN   We discussed:  Patient wanting to know if she should take a probiotic. Counseled patient on the importance of eating a balanced and varied diet, but that a probiotic supplement or probiotic yogurt may also help improve her gut flora.   Plan  Continue current medications   Medication Management   Pt uses [facility] for all medications Uses pill box? Yes Pt endorses 100% compliance  We  discussed: Discussed benefits of medication synchronization, packaging and delivery as well as enhanced pharmacist oversight with [facility].  Plan  Utilize [facility] pharmacy for medication synchronization, packaging and delivery  Verbal consent obtained for [facility] enhanced pharmacy services (medication synchronization, adherence packaging, delivery coordination). A medication sync plan was created to allow patient to get all medications delivered once every 30 to 90 days per patient preference. Patient understands they have freedom to choose pharmacy and clinical pharmacist will coordinate care between all prescribers and [facility].  Follow up: 6 month office visit  [place] Pharmacist [facility]  [place]

## 2020-05-20 ENCOUNTER — Telehealth: Payer: Self-pay

## 2020-05-20 NOTE — Progress Notes (Signed)
Chronic Care Management Pharmacy Assistant   Name: IVIS NICOLSON  MRN: [id number] DOB: 11-06-1950  Reason for Encounter: Medication Review/Initial question for initial visit with clinical pharmacist.   PCP : [name], NP  Allergies:   Allergies  Allergen Reactions  . Iodine-131 Rash  . Other Hives, Rash and Other (See Comments)    Magnavist Contrast    Medications: Outpatient Encounter Medications as of 05/20/2020  Medication Sig Note  . ciprofloxacin (CIPRO) 500 MG tablet Take 1 tablet (500 mg total) by mouth 2 (two) times daily.   Marland Kitchen escitalopram (LEXAPRO) 20 MG tablet Take 1 tablet (20 mg total) by mouth daily.   Marland Kitchen HYDROcodone-acetaminophen (NORCO) 10-325 MG tablet Take 1 tablet by mouth every 6 (six) hours as needed for severe pain (trigine).   Marland Kitchen ibuprofen (ADVIL,MOTRIN) 200 MG tablet Take 800 mg by mouth every 6 (six) hours as needed for headache or moderate pain.    Marland Kitchen lamoTRIgine (LAMICTAL) 25 MG tablet TAKE 3 TABLETS (75 MG TOTAL) BY MOUTH 2 (TWO) TIMES DAILY.   . methylPREDNISolone (MEDROL DOSEPAK) 4 MG TBPK tablet Taper pack as directed   . metroNIDAZOLE (FLAGYL) 500 MG tablet 1 by mouth twice a day for diverticulitis   . omeprazole (PRILOSEC) 20 MG capsule Take 1 capsule (20 mg total) by mouth daily.   . Psyllium (METAMUCIL FIBER PO) Take 1 each by mouth daily.  06/15/2018: As needed  . triamterene-hydrochlorothiazide (DYAZIDE) 37.5-25 MG capsule Take 1 each (1 capsule total) by mouth daily. **DUE FOR YEARLY PHYSICAL**    No facility-administered encounter medications on file as of 05/20/2020.    Current Diagnosis: Patient Active Problem List   Diagnosis Date Noted  . Class 1 obesity due to excess calories without serious comorbidity with body mass index (BMI) of 31.0 to 31.9 in adult 06/15/2018  . Excessive daytime sleepiness 06/15/2018  . Loud snoring 06/15/2018  . RLS (restless legs syndrome) 06/15/2018  . Right trigeminal neuralgia 06/15/2018  .  Essential hypertension 09/20/2016  . History of colonic polyps 02/02/2016  . Diverticulitis of intestine without perforation or abscess without bleeding 02/02/2016  . Routine general medical examination at a health care facility 10/01/2015  . History of skin cancer 10/01/2015  . Hematuria, microscopic 09/10/2013  . GERD 12/19/2009  . HIATAL HERNIA 12/19/2009  . COLONIC POLYPS, HX OF 12/19/2009  . OSA (obstructive sleep apnea) 12/03/2009  . NEURALGIA, TRIGEMINAL 08/19/2008  . Depression 05/24/2007  . Diverticulosis of large intestine 05/24/2007  . WEIGHT GAIN 05/24/2007      Follow-Up:  Pharmacist Review   Have you seen any other providers since your last visit? no Any changes in your medications or health? no Any side effects from any medications? no Do you have an symptoms or problems not managed by your medications? no Any concerns about your health right now? Yes  Patient states she would like to stop taking Prilosec.  Patient states her Lexapro dose is 10 mg and not 20 mg.    Has your provider asked that you check blood pressure, blood sugar, or follow special diet at home? No  Patient states she cooks at home and rarely eats out.   Beef,chicken,tuna,salmon,pasta,and vegatables. Patient states she adds salt for taste. Do you get any type of exercise on a regular basis? no Can you think of a goal you would like to reach for your health? None ID  Do you have any problems getting your medications? no Is there anything that you  would like to discuss during the appointment?   Patient states she would like to discuss supplements. Patient would like to know more about magnesium.Patient states she started vitamin D six months ago and has not taken any other supplements.  Please bring medications and supplements to appointment    [place] Pharmacist Assistant [phone]

## 2020-05-21 ENCOUNTER — Ambulatory Visit: Payer: Medicare Other

## 2020-05-21 ENCOUNTER — Other Ambulatory Visit: Payer: Self-pay | Admitting: Adult Health

## 2020-05-21 DIAGNOSIS — F419 Anxiety disorder, unspecified: Secondary | ICD-10-CM

## 2020-05-21 DIAGNOSIS — F32A Depression, unspecified: Secondary | ICD-10-CM

## 2020-05-21 DIAGNOSIS — K2100 Gastro-esophageal reflux disease with esophagitis, without bleeding: Secondary | ICD-10-CM

## 2020-05-21 DIAGNOSIS — I10 Essential (primary) hypertension: Secondary | ICD-10-CM

## 2020-05-21 MED ORDER — ESCITALOPRAM OXALATE 10 MG PO TABS: 10.0000 mg | ORAL_TABLET | Freq: Every day | ORAL | 1 refills | Status: DC

## 2020-05-21 MED ORDER — OMEPRAZOLE 20 MG PO CPDR: 20.0000 mg | DELAYED_RELEASE_CAPSULE | Freq: Every day | ORAL | 1 refills | Status: DC

## 2020-05-21 NOTE — Patient Instructions (Signed)
Visit Information It was great speaking with you today!  Please let me know if you have any questions about our visit.  Goals Addressed            This Visit's Progress   . Chronic Care Management       CARE PLAN ENTRY (see longitudinal plan of care for additional care plan information)  Current Barriers:  . Chronic Disease Management support, education, and care coordination needs related to Hypertension, GERD, Depression, Osteopenia and Trigeminal Neuralgia and Diverticulitis    Hypertension BP Readings from Last 3 Encounters:  05/07/20 120/72  10/16/19 102/65  08/31/19 132/68   . Pharmacist Clinical Goal(s): o Over the next 90 days, patient will work with PharmD and providers to achieve BP goal <140/90 . Current regimen:  o Triamterene-HCTZ 37.5-25 mg daily  . Interventions: o Discussed low salt diet and exercising as tolerated extensively o Will initiate blood pressure monitoring plan  . Patient self care activities - Over the next 90 days, patient will: o Check Blood Pressure 3-4 times weekly, document, and provide at future appointments o Ensure daily salt intake < 2300 mg/day  GERD . Pharmacist Clinical Goal(s) o Over the next 90 days, patient will work with PharmD and providers to minimize symptoms of heartburn and reflux and minimize side effects from treatment . Current regimen:  o Omeprazole 20 mg daily . Interventions: o Recommend decreasing omeprazole to 20 mg every other day  o Recommend using Tums or Pepcid 20 mg as needed for any heartburn symptoms . Patient self care activities - Over the next 90 days, patient will: o Avoid food and drinks that trigger acid reflux including tomato sauce, spicy foods and carbonated beverages.  Osteopenia . Pharmacist Clinical Goal(s) o Over the next 90 days, patient will work with PharmD and providers to maintain bone strength and prevent fractures  . Current regimen:  o Vitamin D 1000 units twice  daily . Interventions: o Recommend [phone] units of vitamin D daily o Recommend 1200 mg of calcium daily from dietary and supplemental sources. You can try calcium with magnesium (Calcium Complete) which can help minimize risk of constipation  Medication management . Pharmacist Clinical Goal(s): o Over the next 90 days, patient will work with PharmD and providers to maintain optimal medication adherence . Current pharmacy: [facility] . Interventions o Comprehensive medication review performed. o Utilize [facility] pharmacy for medication synchronization, packaging and delivery o Verbal consent obtained for [facility] enhanced pharmacy services (medication synchronization, adherence packaging, delivery coordination). A medication sync plan was created to allow patient to get all medications delivered once every 30 to 90 days per patient preference. Patient understands they have freedom to choose pharmacy and clinical pharmacist will coordinate care between all prescribers and [facility]. . Patient self care activities - Over the next 90 days, patient will: o Take medications as prescribed o Report any questions or concerns to PharmD and/or provider(s)       Ms. Espino was given information about Chronic Care Management services today including:  1. CCM service includes personalized support from designated clinical staff supervised by her physician, including individualized plan of care and coordination with other care providers 2. 24/7 contact phone numbers for assistance for urgent and routine care needs. 3. Standard insurance, coinsurance, copays and deductibles apply for chronic care management only during months in which we provide at least 20 minutes of these services. Most insurances cover these services at 100%, however patients may be responsible  for any copay, coinsurance and/or deductible if applicable. This service may help you avoid the need for more expensive  face-to-face services. 4. Only one practitioner may furnish and bill the service in a calendar month. 5. The patient may stop CCM services at any time (effective at the end of the month) by phone call to the office staff.  Patient agreed to services and verbal consent obtained.   The patient verbalized understanding of instructions provided today and agreed to receive a mailed copy of patient instruction and/or educational materials. Face to Face appointment with pharmacist scheduled for: 11/18/20 at 11:00 AM  [facility] [place]

## 2020-05-21 NOTE — Progress Notes (Signed)
Hi Krystal Monroe,   I am fine with her starting to work on coming off Prilosec.   I have sent in a refill for Celexa 10 mg   Thanks  [employer]

## 2020-05-22 ENCOUNTER — Telehealth: Payer: Self-pay

## 2020-05-22 NOTE — Progress Notes (Signed)
Krystal Monroe,  Please reach out to Krystal Monroe to inform her that Tommi Rumps was in agreement about trying to decrease her Prilosec. Please inform her to decrease her Prilosec to one tablet every other day. She should make sure to avoid food that trigger her reflux symptoms and I would recommend keeping some Tums or Pepcid on hand to take as needed for any additional symptoms that occur.  Thanks, Cristie Hem

## 2020-05-22 NOTE — Progress Notes (Signed)
Spoke to patient to inform her per clinical pharmacist that Tommi Rumps was in agreement about trying to decrease her Prilosec. Informed patient  to decrease her Prilosec to one tablet every other day. She should make sure to avoid food that trigger her reflux symptoms and I would recommend keeping some Tums or Pepcid on hand to take as needed for any additional symptoms that occur.  Patient verbalized understanding.  Completed patient form to come on board with [facility].  [place] Pharmacist Assistant [phone]

## 2020-05-22 NOTE — Telephone Encounter (Signed)
-----   Message from Germaine Pomfret, [facility] sent at 05/22/2020  8:34 AM EDT -----   ----- Message ----- From: [name], NP Sent: 05/21/2020   4:36 PM EDT To: Germaine Pomfret, [place]    ----- Message ----- From: Germaine Pomfret, [facility] Sent: 05/21/2020   2:59 PM EDT To: [name], NP  Tommi Rumps, Please see below for a summary of my recommendations from my visit with Mrs. Pompey:-Patient has only been taking Escitalopram 10 mg daily as she started to have side effects with 20 mg. Can you please send in a new Rx for 10 mg tablets [facility] so we may fill these for her? -Patient needs a refill for her omeprazole sent in as well. She is also interested in trying to come off the medication entirely if possible, would you be ok with having her decrease to every other day dosing?- patient has had some concerns about her restless legs which has been an ongoing problem for her. At her next visit with you, can we check her magnesium and B12 levels to make sure those are not contributing to her symptoms?  I would also recommend rechecking a vitamin D level for her osteopenia.Thanks,Alex Fleury,PharmDClinical PharmacistLeBauer Primary Care at [phone]

## 2020-06-05 HISTORY — PX: CATARACT EXTRACTION: SUR2

## 2020-06-13 ENCOUNTER — Ambulatory Visit: Payer: PRIVATE HEALTH INSURANCE | Admitting: Adult Health

## 2020-06-18 ENCOUNTER — Ambulatory Visit: Payer: PRIVATE HEALTH INSURANCE | Admitting: Adult Health

## 2020-06-18 NOTE — Progress Notes (Deleted)
Subjective:    Patient ID: Krystal Monroe, female    DOB: 1950/08/18, 69 y.o.   MRN: [id number]  HPI Patient presents for yearly preventative medicine examination. She is a pleasant 69 year old female who  has a past medical history of Anxiety, Asthma, Basal cell carcinoma of nose, Colon polyps, Depression, Diverticulosis of colon, Gallstones, GERD (gastroesophageal reflux disease), Hypertension, Obesity, Sleep apnea, and Trigeminal neuralgia.  Depression/ Anxiety -currently prescribed Lexapro 10 mg, she has been on this medication for many years.  She does feel as though her symptoms are well controlled on this medication  GERD- takes Prilosec 20 mg daily.  She feels well controlled  Hypertension -prescribed Dyazide 30.5-25 mg.  She does monitor her blood pressure at home and reports readings in the 063 systolic.  She denies chest pain, shortness of breath, dizziness, lightheadedness, or headaches BP Readings from Last 3 Encounters:  05/07/20 120/72  10/16/19 102/65  08/31/19 132/68   Trigeminal Neuralgia -is monitored by neurology.  Currently prescribed Lamictal 75 mg daily she denies any recent flares   All immunizations and health maintenance protocols were reviewed with the patient and needed orders were placed.  Appropriate screening laboratory values were ordered for the patient including screening of hyperlipidemia, renal function and hepatic function.  Medication reconciliation,  past medical history, social history, problem list and allergies were reviewed in detail with the patient  Goals were established with regard to weight loss, exercise, and  diet in compliance with medications  End of life planning was discussed.  She is up-to-date on routine colon cancer screening, mammogram, dental and vision screens    Review of Systems  Constitutional: Negative.   HENT: Negative.   Eyes: Negative.   Respiratory: Negative.   Cardiovascular: Negative.   Gastrointestinal:  Negative.   Endocrine: Negative.   Genitourinary: Negative.   Musculoskeletal: Negative.   Skin: Negative.   Allergic/Immunologic: Negative.   Neurological: Negative.   Hematological: Negative.   Psychiatric/Behavioral: Negative.      Past Medical History:  Diagnosis Date  . Anxiety    son killed in MVA  . Asthma    as a child  . Basal cell carcinoma of nose   . Colon polyps    2011  . Depression    son killed in [place]  . Diverticulosis of colon    diverticulitits  . Gallstones   . GERD (gastroesophageal reflux disease)   . Hypertension   . Obesity   . Sleep apnea    mild per pt-no tx  . Trigeminal neuralgia     Social History   Socioeconomic History  . Marital status: Married    Spouse name: Not on file  . Number of children: 3  . Years of education: BSN  . Highest education level: Bachelor's degree (e.g., BA, AB, BS)  Occupational History  . 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 Smoking status: Never Smoker  . Smokeless tobacco: Never Used  Vaping Use  . Vaping Use: Never used  Substance and Sexual Activity  . Alcohol use: No    Alcohol/week: 0.0 standard drinks  . Drug use: No  . Sexual activity: Not on file  Other Topics Concern  . Not on file  Social History Narrative   Lives with husband    caffeine 2 a day      10/19/2018:   Lives with husband and 2 golden retrievers on one level  Has three children, one daughter, one son, and one deceased son s/p MVA.   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   Enjoys reading, needlework, painting            Social Determinants of Health   Financial Resource Strain: Low Risk   . Difficulty of Paying Living Expenses: Not hard at all  Food Insecurity:   . Worried About Charity fundraiser in the Last Year: Not on file  . Ran Out of Food in the Last Year: Not on file  Transportation  Needs: No Transportation Needs  . Lack of Transportation (Medical): No  . Lack of Transportation (Non-Medical): No  Physical Activity:   . Days of Exercise per Week: Not on file  . Minutes of Exercise per Session: Not on file  Stress:   . Feeling of Stress : Not on file  Social Connections:   . Frequency of Communication with Friends and Family: Not on file  . Frequency of Social Gatherings with Friends and Family: Not on file  . Attends Religious Services: Not on file  . Active Member of Clubs or Organizations: Not on file  . Attends Archivist Meetings: Not on file  . Marital Status: Not on file  Intimate Partner Violence:   . Fear of Current or Ex-Partner: Not on file  . Emotionally Abused: Not on file  . Physically Abused: Not on file  . Sexually Abused: Not on file    Past Surgical History:  Procedure Laterality Date  . ABDOMINAL HYSTERECTOMY     1997  . [place]   x 3  . CHOLECYSTECTOMY  2002  . COLONOSCOPY  last 03/15/2016  . ROTATOR CUFF REPAIR Right 2017  . TOTAL ABDOMINAL HYSTERECTOMY W/ BILATERAL SALPINGOOPHORECTOMY  2001  . UPPER GASTROINTESTINAL ENDOSCOPY      Family History  Problem Relation Age of Onset  . Diabetes Maternal Grandmother   . Crohn's disease Father   . Lung cancer Father   . Breast cancer Mother   . Osteoporosis Mother   . Dementia Mother   . Diabetes Mother   . Hypertension Mother   . Colon cancer Neg Hx   . Esophageal cancer Neg Hx   . Rectal cancer Neg Hx   . Stomach cancer Neg Hx   . Colon polyps Neg Hx     Allergies  Allergen Reactions  . Iodine-131 Rash  . Other Hives, Rash and Other (See Comments)    Magnavist Contrast    Current Outpatient Medications on File Prior to Visit  Medication Sig Dispense Refill  . cholecalciferol (VITAMIN D3) 25 MCG (1000 UNIT) tablet Take 1,000 Units by mouth 2 (two) times daily.    Marland Kitchen escitalopram (LEXAPRO) 10 MG tablet Take 1 tablet (10 mg total) by mouth  daily. 90 tablet 1  . lamoTRIgine (LAMICTAL) 25 MG tablet TAKE 3 TABLETS (75 MG TOTAL) BY MOUTH 2 (TWO) TIMES DAILY. 540 tablet 1  . omeprazole (PRILOSEC) 20 MG capsule Take 1 capsule (20 mg total) by mouth daily. 90 capsule 1  . triamterene-hydrochlorothiazide (DYAZIDE) 37.5-25 MG capsule Take 1 each (1 capsule total) by mouth daily. **DUE FOR YEARLY PHYSICAL** 90 capsule 1   No current facility-administered medications on file prior to visit.    There were no vitals taken for this visit.      Objective:   Physical Exam Vitals and  nursing note reviewed.  Constitutional:      General: She is not in acute distress.    Appearance: Normal appearance. She is well-developed. She is not ill-appearing.  HENT:     Head: Normocephalic and atraumatic.     Right Ear: Tympanic membrane, ear canal and external ear normal. There is no impacted cerumen.     Left Ear: Tympanic membrane, ear canal and external ear normal. There is no impacted cerumen.     Nose: Nose normal. No congestion or rhinorrhea.     Mouth/Throat:     Mouth: Mucous membranes are moist.     Pharynx: Oropharynx is clear. No oropharyngeal exudate or posterior oropharyngeal erythema.  Eyes:     General:        Right eye: No discharge.        Left eye: No discharge.     Extraocular Movements: Extraocular movements intact.     Conjunctiva/sclera: Conjunctivae normal.     Pupils: Pupils are equal, round, and reactive to light.  Neck:     Thyroid: No thyromegaly.     Vascular: No carotid bruit.     Trachea: No tracheal deviation.  Cardiovascular:     Rate and Rhythm: Normal rate and regular rhythm.     Pulses: Normal pulses.     Heart sounds: Normal heart sounds. No murmur heard.  No friction rub. No gallop.   Pulmonary:     Effort: Pulmonary effort is normal. No respiratory distress.     Breath sounds: Normal breath sounds. No stridor. No wheezing, rhonchi or rales.  Chest:     Chest wall: No tenderness.  Abdominal:      General: Abdomen is flat. Bowel sounds are normal. There is no distension.     Palpations: Abdomen is soft. There is no mass.     Tenderness: There is no abdominal tenderness. There is no right CVA tenderness, left CVA tenderness, guarding or rebound.     Hernia: No hernia is present.  Musculoskeletal:        General: No swelling, tenderness, deformity or signs of injury. Normal range of motion.     Cervical back: Normal range of motion and neck supple.     Right lower leg: No edema.     Left lower leg: No edema.  Lymphadenopathy:     Cervical: No cervical adenopathy.  Skin:    General: Skin is warm and dry.     Coloration: Skin is not jaundiced or pale.     Findings: No bruising, erythema, lesion or rash.  Neurological:     General: No focal deficit present.     Mental Status: She is alert and oriented to person, place, and time.     Cranial Nerves: No cranial nerve deficit.     Sensory: No sensory deficit.     Motor: No weakness.     Coordination: Coordination normal.     Gait: Gait normal.     Deep Tendon Reflexes: Reflexes normal.  Psychiatric:        Mood and Affect: Mood normal.        Behavior: Behavior normal.        Thought Content: Thought content normal.        Judgment: Judgment normal.       Assessment & Plan:

## 2020-06-23 ENCOUNTER — Telehealth: Payer: Self-pay | Admitting: Family Medicine

## 2020-06-23 NOTE — Telephone Encounter (Signed)
-----   Message from Larey Seat, MD sent at 06/20/2020 12:38 PM EDT ----- Regarding: CPAP interfaces  All masks and all headgear will touch some area of the middle branch of the trigeminal nerve - upper lip, nasal surrounding, or cheek bone.  Look at the bella swift mask which comes with headgear or ear loops, or the pillairo- these may just push a little less -but they will touch the face.  ----- Message ----- From: Debbora Presto, NP Sent: 05/07/2020   8:54 AM EDT To: Larey Seat, MD  Hey Dr Dohmeier. Would you have any thoughts on a CPAP mask that would be best for trigeminal nerve patient? Krystal Monroe is doing well from TN perspective. I gave her medrol taper and hydrocodone 5 tabs to have on hand for acute flare. She is not at all interested in [place]. Could not tolerate dental device. Scared to use CPAP unless there is a new mask option for her that would not trigger TN pain. Any suggestions?

## 2020-06-23 NOTE — Telephone Encounter (Signed)
Can you let her know that I spoke with Dr Brett Fairy and below was her recommendation. I will be happy to send her for a mask refitting to try one of the two options recommended if she wishes. TY

## 2020-06-23 NOTE — Telephone Encounter (Signed)
LMVM for pt re: cpap options for TN (mask, supplies). Also send in [place] as well.

## 2020-06-24 ENCOUNTER — Telehealth: Payer: Self-pay | Admitting: Family Medicine

## 2020-06-24 ENCOUNTER — Telehealth: Payer: Self-pay

## 2020-06-24 MED ORDER — LAMOTRIGINE 25 MG PO TABS
75.0000 mg | ORAL_TABLET | Freq: Two times a day (BID) | ORAL | 1 refills | Status: DC
Start: 2020-06-24 — End: 2021-01-07

## 2020-06-24 NOTE — Progress Notes (Signed)
    Chronic Care Management Pharmacy Assistant   Name: Krystal Monroe  MRN: [id number] DOB: 07-22-1951  Reason for Encounter: Medication Review  PCP : [name], NP  Allergies:   Allergies  Allergen Reactions  . Iodine-131 Rash  . Other Hives, Rash and Other (See Comments)    Magnavist Contrast    Medications: Outpatient Encounter Medications as of 06/24/2020  Medication Sig  . cholecalciferol (VITAMIN D3) 25 MCG (1000 UNIT) tablet Take 1,000 Units by mouth 2 (two) times daily.  Marland Kitchen escitalopram (LEXAPRO) 10 MG tablet Take 1 tablet (10 mg total) by mouth daily.  Marland Kitchen lamoTRIgine (LAMICTAL) 25 MG tablet TAKE 3 TABLETS (75 MG TOTAL) BY MOUTH 2 (TWO) TIMES DAILY.  Marland Kitchen omeprazole (PRILOSEC) 20 MG capsule Take 1 capsule (20 mg total) by mouth daily.  Marland Kitchen triamterene-hydrochlorothiazide (DYAZIDE) 37.5-25 MG capsule Take 1 each (1 capsule total) by mouth daily. **DUE FOR YEARLY PHYSICAL**   No facility-administered encounter medications on file as of 06/24/2020.    Current Diagnosis: Patient Active Problem List   Diagnosis Date Noted  . Class 1 obesity due to excess calories without serious comorbidity with body mass index (BMI) of 31.0 to 31.9 in adult 06/15/2018  . Excessive daytime sleepiness 06/15/2018  . Loud snoring 06/15/2018  . RLS (restless legs syndrome) 06/15/2018  . Right trigeminal neuralgia 06/15/2018  . Essential hypertension 09/20/2016  . History of colonic polyps 02/02/2016  . Diverticulitis of intestine without perforation or abscess without bleeding 02/02/2016  . Routine general medical examination at a health care facility 10/01/2015  . History of skin cancer 10/01/2015  . Hematuria, microscopic 09/10/2013  . GERD 12/19/2009  . HIATAL HERNIA 12/19/2009  . COLONIC POLYPS, HX OF 12/19/2009  . OSA (obstructive sleep apnea) 12/03/2009  . NEURALGIA, TRIGEMINAL 08/19/2008  . Depression 05/24/2007  . Diverticulosis of large intestine 05/24/2007  . WEIGHT GAIN  05/24/2007    Follow-Up:  Pharmacist Review   Patient called requesting a refill for lamotrigine 25 MG tablet. Patient states she will not have any on hand as of 06/24/20.  [facility] Pharmacist Assistant [phone]

## 2020-06-24 NOTE — Addendum Note (Signed)
Addended by: Brandon Melnick on: 06/24/2020 04:36 PM   Modules accepted: Orders

## 2020-06-24 NOTE — Telephone Encounter (Signed)
Pt is requesting a refill for  lamoTRIgine (LAMICTAL) 25 MG tablet .  Pharmacy: [facility] is asking that this be filled today since pt is out

## 2020-06-24 NOTE — Telephone Encounter (Signed)
Spoke to pt to let her know [place] to [place] she appreciated this.

## 2020-07-01 ENCOUNTER — Ambulatory Visit (INDEPENDENT_AMBULATORY_CARE_PROVIDER_SITE_OTHER): Payer: Medicare Other | Admitting: Adult Health

## 2020-07-01 ENCOUNTER — Other Ambulatory Visit: Payer: Self-pay

## 2020-07-01 ENCOUNTER — Encounter: Payer: Self-pay | Admitting: Adult Health

## 2020-07-01 VITALS — BP 120/70 | HR 67 | Temp 98.1°F | Ht 63.5 in | Wt 174.4 lb

## 2020-07-01 DIAGNOSIS — K2100 Gastro-esophageal reflux disease with esophagitis, without bleeding: Secondary | ICD-10-CM

## 2020-07-01 DIAGNOSIS — G2581 Restless legs syndrome: Secondary | ICD-10-CM | POA: Diagnosis not present

## 2020-07-01 DIAGNOSIS — F419 Anxiety disorder, unspecified: Secondary | ICD-10-CM

## 2020-07-01 DIAGNOSIS — Z23 Encounter for immunization: Secondary | ICD-10-CM

## 2020-07-01 DIAGNOSIS — I10 Essential (primary) hypertension: Secondary | ICD-10-CM | POA: Diagnosis not present

## 2020-07-01 DIAGNOSIS — F32A Depression, unspecified: Secondary | ICD-10-CM

## 2020-07-01 DIAGNOSIS — G500 Trigeminal neuralgia: Secondary | ICD-10-CM

## 2020-07-01 MED ORDER — TRIAMTERENE-HCTZ 37.5-25 MG PO CAPS: 1.0000 | ORAL_CAPSULE | Freq: Every day | ORAL | 3 refills | Status: DC

## 2020-07-01 NOTE — Progress Notes (Signed)
Subjective:    Patient ID: Krystal Monroe, female    DOB: June 19, 1951, 69 y.o.   MRN: [id number]  HPI Patient presents for yearly preventative medicine examination. She is a pleasant 69 year old female who  has a past medical history of Anxiety, Asthma, Basal cell carcinoma of nose, Colon polyps, Depression, Diverticulosis of colon, Gallstones, GERD (gastroesophageal reflux disease), Hypertension, Obesity, Sleep apnea, and Trigeminal neuralgia.   Essential Hypertension -only prescribed Dyazide 37.5-25 mg daily.  She does monitor her blood pressure at home currently and gets readings of 1 165-790 systolic.  She denies dizziness, lightheadedness, chest pain, shortness of breath  GERD -prescribed Prilosec 20 mg daily.  Her symptoms are well controlled for the most part, she is working on weaning and it taking every other day and is doing well with this   Depression-controlled with Lexapro 10 mg daily  Trigeminal Neuralgia -seen by neurology.  She is currently controlled on Lamictal 25 mg 3 times daily.  Has had one flare up in the last year. She does have prescribed Norco to use PRN  Restless Leg syndrome - has been present for multiple years. Some nights she does not have any symptoms, other times she has restless leg symptoms that last for a week or longer. Since she has been exercising more she has had less frequent RRL symptoms   All immunizations and health maintenance protocols were reviewed with the patient and needed orders were placed.  Appropriate screening laboratory values were ordered for the patient including screening of hyperlipidemia, renal function and hepatic function.  Medication reconciliation,  past medical history, social history, problem list and allergies were reviewed in detail with the patient  Goals were established with regard to weight loss, exercise, and  diet in compliance with medications Wt Readings from Last 3 Encounters:  07/01/20 174 lb 6.4 oz (79.1 kg)   05/07/20 174 lb 9.6 oz (79.2 kg)  10/16/19 173 lb (78.5 kg)   She is up to date on routine mammogram, colon cancer screening, dental and vision screens  She has no acute issues today  Review of Systems  Constitutional: Negative.   HENT: Negative.   Eyes: Negative.   Respiratory: Negative.   Cardiovascular: Negative.   Gastrointestinal: Negative.   Endocrine: Negative.   Genitourinary: Negative.   Musculoskeletal: Negative.   Skin: Negative.   Allergic/Immunologic: Negative.   Neurological: Positive for tremors.  Hematological: Negative.   Psychiatric/Behavioral: Negative.    Past Medical History:  Diagnosis Date  . Anxiety    son killed in MVA  . Asthma    as a child  . Basal cell carcinoma of nose   . Colon polyps    2011  . Depression    son killed in [place]  . Diverticulosis of colon    diverticulitits  . Gallstones   . GERD (gastroesophageal reflux disease)   . Hypertension   . Obesity   . Sleep apnea    mild per pt-no tx  . Trigeminal neuralgia     Social History   Socioeconomic History  . Marital status: Married    Spouse name: Not on file  . Number of children: 3  . Years of education: BSN  . Highest education level: Bachelor's degree (e.g., BA, AB, BS)  Occupational History  . 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 Smoking status: Never Smoker  . Smokeless tobacco: Never Used  Vaping Use  .  Vaping Use: Never used  Substance and Sexual Activity  . Alcohol use: No    Alcohol/week: 0.0 standard drinks  . Drug use: No  . Sexual activity: Not on file  Other Topics Concern  . Not on file  Social History Narrative   Lives with husband    caffeine 2 a day      10/19/2018:   Lives with husband and 2 golden retrievers on one level   Has three children, one daughter, one son, and one deceased son s/p MVA.   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   Enjoys reading, needlework, painting            Social Determinants of Health   Financial Resource Strain: Low Risk   . Difficulty of Paying Living Expenses: Not hard at all  Food Insecurity:   . Worried About Charity fundraiser in the Last Year: Not on file  . Ran Out of Food in the Last Year: Not on file  Transportation Needs: No Transportation Needs  . Lack of Transportation (Medical): No  . Lack of Transportation (Non-Medical): No  Physical Activity:   . Days of Exercise per Week: Not on file  . Minutes of Exercise per Session: Not on file  Stress:   . Feeling of Stress : Not on file  Social Connections:   . Frequency of Communication with Friends and Family: Not on file  . Frequency of Social Gatherings with Friends and Family: Not on file  . Attends Religious Services: Not on file  . Active Member of Clubs or Organizations: Not on file  . Attends Archivist Meetings: Not on file  . Marital Status: Not on file  Intimate Partner Violence:   . Fear of Current or Ex-Partner: Not on file  . Emotionally Abused: Not on file  . Physically Abused: Not on file  . Sexually Abused: Not on file    Past Surgical History:  Procedure Laterality Date  . ABDOMINAL HYSTERECTOMY     1997  . CATARACT EXTRACTION Left 06/05/2020   per patient  . [place]   x 3  . CHOLECYSTECTOMY  2002  . COLONOSCOPY  last 03/15/2016  . EYE SURGERY    . ROTATOR CUFF REPAIR Right 2017  . TOTAL ABDOMINAL HYSTERECTOMY W/ BILATERAL SALPINGOOPHORECTOMY  2001  . UPPER GASTROINTESTINAL ENDOSCOPY      Family History  Problem Relation Age of Onset  . Diabetes Maternal Grandmother   . Crohn's disease Father   . Lung cancer Father   . Breast cancer Mother   . Osteoporosis Mother   . Dementia Mother   . Diabetes Mother   . Hypertension Mother   . Colon cancer Neg Hx   . Esophageal cancer Neg Hx   . Rectal cancer Neg Hx   . Stomach cancer Neg Hx    . Colon polyps Neg Hx     Allergies  Allergen Reactions  . Iodine-131 Rash  . Other Hives, Rash and Other (See Comments)    Magnavist Contrast    Current Outpatient Medications on File Prior to Visit  Medication Sig Dispense Refill  . cholecalciferol (VITAMIN D3) 25 MCG (1000 UNIT) tablet Take 1,000 Units by mouth 2 (two) times daily.    Marland Kitchen escitalopram (LEXAPRO) 10 MG tablet Take 1 tablet (10 mg total) by mouth daily. 90 tablet 1  . lamoTRIgine (LAMICTAL)  25 MG tablet Take 3 tablets (75 mg total) by mouth 2 (two) times daily. 540 tablet 1  . omeprazole (PRILOSEC) 20 MG capsule Take 1 capsule (20 mg total) by mouth daily. 90 capsule 1  . triamterene-hydrochlorothiazide (DYAZIDE) 37.5-25 MG capsule Take 1 each (1 capsule total) by mouth daily. **DUE FOR YEARLY PHYSICAL** 90 capsule 1   No current facility-administered medications on file prior to visit.    BP 120/70 (BP Location: Left Arm, Patient Position: Sitting, Cuff Size: Large)   Pulse 67   Temp 98.1 F (36.7 C) (Oral)   Ht 5' 3.5" (1.613 m)   Wt 174 lb 6.4 oz (79.1 kg)   BMI 30.41 kg/m       Objective:   Physical Exam Vitals and nursing note reviewed.  Constitutional:      General: She is not in acute distress.    Appearance: Normal appearance. She is well-developed. She is obese. She is not ill-appearing.  HENT:     Head: Normocephalic and atraumatic.     Right Ear: Tympanic membrane, ear canal and external ear normal. There is no impacted cerumen.     Left Ear: Tympanic membrane, ear canal and external ear normal. There is no impacted cerumen.     Nose: Nose normal. No congestion or rhinorrhea.     Mouth/Throat:     Mouth: Mucous membranes are moist.     Pharynx: Oropharynx is clear. No oropharyngeal exudate or posterior oropharyngeal erythema.  Eyes:     General:        Right eye: No discharge.        Left eye: No discharge.     Extraocular Movements: Extraocular movements intact.     Conjunctiva/sclera:  Conjunctivae normal.     Pupils: Pupils are equal, round, and reactive to light.  Neck:     Thyroid: No thyromegaly.     Vascular: No carotid bruit.     Trachea: No tracheal deviation.  Cardiovascular:     Rate and Rhythm: Normal rate and regular rhythm.     Pulses: Normal pulses.     Heart sounds: Normal heart sounds. No murmur heard.  No friction rub. No gallop.   Pulmonary:     Effort: Pulmonary effort is normal. No respiratory distress.     Breath sounds: Normal breath sounds. No stridor. No wheezing, rhonchi or rales.  Chest:     Chest wall: No tenderness.  Abdominal:     General: Abdomen is flat. Bowel sounds are normal. There is no distension.     Palpations: Abdomen is soft. There is no mass.     Tenderness: There is no abdominal tenderness. There is no right CVA tenderness, left CVA tenderness, guarding or rebound.     Hernia: No hernia is present.  Musculoskeletal:        General: No swelling, tenderness, deformity or signs of injury. Normal range of motion.     Cervical back: Normal range of motion and neck supple.     Right lower leg: No edema.     Left lower leg: No edema.  Lymphadenopathy:     Cervical: No cervical adenopathy.  Skin:    General: Skin is warm and dry.     Capillary Refill: Capillary refill takes less than 2 seconds.     Coloration: Skin is not jaundiced or pale.     Findings: No bruising, erythema, lesion or rash.  Neurological:     General: No focal deficit present.  Mental Status: She is alert and oriented to person, place, and time.     Cranial Nerves: No cranial nerve deficit.     Sensory: No sensory deficit.     Motor: No weakness.     Coordination: Coordination normal.     Gait: Gait normal.     Deep Tendon Reflexes: Reflexes normal.  Psychiatric:        Mood and Affect: Mood normal.        Behavior: Behavior normal.        Thought Content: Thought content normal.        Judgment: Judgment normal.       Assessment & Plan:  1.  Essential hypertension - Well controlled.  - CBC with Differential/Platelet; Future - Hemoglobin A1c; Future - Lipid panel; Future - TSH; Future - CMP with eGFR([facility]); Future - triamterene-hydrochlorothiazide (DYAZIDE) 37.5-25 MG capsule; Take 1 each (1 capsule total) by mouth daily.  Dispense: 90 capsule; Refill: 3  2. NEURALGIA, TRIGEMINAL - Continue to follow neurology plan of care 3. Gastroesophageal reflux disease with esophagitis without hemorrhage - Prilosec 20 mg BID and can continue to weean  - CBC with Differential/Platelet; Future - Hemoglobin A1c; Future - Lipid panel; Future - TSH; Future - CMP with eGFR([facility]); Future  4. RLS (restless legs syndrome) - Continue with exercise - this seems to be working for her  - CBC with Differential/Platelet; Future - Hemoglobin A1c; Future - Lipid panel; Future - TSH; Future - CMP with eGFR([facility]); Future  5. Anxiety - Continue with Lexapro 10 mg   6. Depression, unspecified depression type - Continue with Lexapro 10 mg   7. Need for immunization against influenza  - Flu Vaccine QUAD High Dose(Fluad)  [name], NP

## 2020-07-01 NOTE — Patient Instructions (Signed)
It was great seeing you today   We will follow up with you regarding your blood work   Please see me back in one year or sooner if needed

## 2020-07-04 ENCOUNTER — Other Ambulatory Visit: Payer: Self-pay

## 2020-07-04 ENCOUNTER — Other Ambulatory Visit: Payer: Medicare Other

## 2020-07-04 DIAGNOSIS — I10 Essential (primary) hypertension: Secondary | ICD-10-CM

## 2020-07-04 DIAGNOSIS — K2100 Gastro-esophageal reflux disease with esophagitis, without bleeding: Secondary | ICD-10-CM

## 2020-07-04 DIAGNOSIS — G2581 Restless legs syndrome: Secondary | ICD-10-CM

## 2020-07-05 LAB — COMPLETE METABOLIC PANEL WITH GFR
AG Ratio: 1.8 (calc) (ref 1.0–2.5)
ALT: 13 U/L (ref 6–29)
AST: 14 U/L (ref 10–35)
Albumin: 4.6 g/dL (ref 3.6–5.1)
Alkaline phosphatase (APISO): 55 U/L (ref 37–153)
BUN: 17 mg/dL (ref 7–25)
CO2: 29 mmol/L (ref 20–32)
Calcium: 9.9 mg/dL (ref 8.6–10.4)
Chloride: 102 mmol/L (ref 98–110)
Creat: 0.83 mg/dL (ref 0.60–0.93)
GFR, Est African American: 83 mL/min/{1.73_m2} (ref >=60)
GFR, Est Non African American: 71 mL/min/{1.73_m2} (ref >=60)
Globulin: 2.6 g/dL (calc) (ref 1.9–3.7)
Glucose, Bld: 107 mg/dL — ABNORMAL HIGH (ref 65–99)
Potassium: 4.6 mmol/L (ref 3.5–5.3)
Sodium: 140 mmol/L (ref 135–146)
Total Bilirubin: 0.5 mg/dL (ref 0.2–1.2)
Total Protein: 7.2 g/dL (ref 6.1–8.1)

## 2020-07-05 LAB — CBC WITH DIFFERENTIAL/PLATELET
Absolute Monocytes: 319 cells/uL (ref 200–950)
Basophils Absolute: 20 cells/uL (ref 0–200)
Basophils Relative: 0.3 %
Eosinophils Absolute: 111 cells/uL (ref 15–500)
Eosinophils Relative: 1.7 %
HCT: 40.6 % (ref 35.0–45.0)
Hemoglobin: 13.4 g/dL (ref 11.7–15.5)
Lymphs Abs: 1333 cells/uL (ref 850–3900)
MCH: 28.5 pg (ref 27.0–33.0)
MCHC: 33 g/dL (ref 32.0–36.0)
MCV: 86.4 fL (ref 80.0–100.0)
MPV: 10.9 fL (ref 7.5–12.5)
Monocytes Relative: 4.9 %
Neutro Abs: 4719 cells/uL (ref 1500–7800)
Neutrophils Relative %: 72.6 %
Platelets: 235 10*3/uL (ref 140–400)
RBC: 4.7 10*6/uL (ref 3.80–5.10)
RDW: 12.8 % (ref 11.0–15.0)
Total Lymphocyte: 20.5 %
WBC: 6.5 10*3/uL (ref 3.8–10.8)

## 2020-07-05 LAB — LIPID PANEL
Cholesterol: 184 mg/dL (ref <200)
HDL: 41 mg/dL — ABNORMAL LOW (ref >=50)
LDL Cholesterol (Calc): 106 mg/dL (calc) — ABNORMAL HIGH
Non-HDL Cholesterol (Calc): 143 mg/dL (calc) — ABNORMAL HIGH (ref <130)
Total CHOL/HDL Ratio: 4.5 (calc) (ref <5.0)
Triglycerides: 241 mg/dL — ABNORMAL HIGH (ref <150)

## 2020-07-05 LAB — HEMOGLOBIN A1C
Hgb A1c MFr Bld: 6.2 % of total Hgb — ABNORMAL HIGH (ref <5.7)
Mean Plasma Glucose: 131 (calc)
eAG (mmol/L): 7.3 (calc)

## 2020-07-05 LAB — TSH: TSH: 2.17 mIU/L (ref 0.40–4.50)

## 2020-07-17 NOTE — Progress Notes (Signed)
Overall her labs are stable, her triglycerides and LDL continue to be elevated. At this point it would be recommended to start a statin [place] to help bring her levels down, at this point she has a roughly 11-1/2% chance of having a heart attack or stroke over the next 10 years if she does not take any medication. Anything over 6-1/2 is recommended by [employer] Association to start a statin medication.   A1c shows that she is prediabetic with a reading of 6.2, anything over 6.5 is considered diabetic. Please work on lifestyle modification

## 2020-07-23 ENCOUNTER — Telehealth: Payer: Self-pay | Admitting: Pharmacist

## 2020-07-23 NOTE — Chronic Care Management (AMB) (Signed)
    Chronic Care Management Pharmacy Assistant   Name: JAZARIA JARECKI  MRN: [id number] DOB: [birth year]  Reason for Encounter: Medication Review  Patient Questions:  1.  Have you seen any other providers since your last visit? Yes, Office Visit 07-04-2020  2.  Any changes in your medicines or health? No  PCP : [name], NP  Allergies:   Allergies  Allergen Reactions  . Iodine-131 Rash  . Other Hives, Rash and Other (See Comments)    Magnavist Contrast    Medications: Outpatient Encounter Medications as of 07/23/2020  Medication Sig  . cholecalciferol (VITAMIN D3) 25 MCG (1000 UNIT) tablet Take 1,000 Units by mouth 2 (two) times daily.  Marland Kitchen escitalopram (LEXAPRO) 10 MG tablet Take 1 tablet (10 mg total) by mouth daily.  Marland Kitchen lamoTRIgine (LAMICTAL) 25 MG tablet Take 3 tablets (75 mg total) by mouth 2 (two) times daily.  Marland Kitchen omeprazole (PRILOSEC) 20 MG capsule Take 1 capsule (20 mg total) by mouth daily.  Marland Kitchen triamterene-hydrochlorothiazide (DYAZIDE) 37.5-25 MG capsule Take 1 each (1 capsule total) by mouth daily.   No facility-administered encounter medications on file as of 07/23/2020.    Current Diagnosis: Patient Active Problem List   Diagnosis Date Noted  . Class 1 obesity due to excess calories without serious comorbidity with body mass index (BMI) of 31.0 to 31.9 in adult 06/15/2018  . Excessive daytime sleepiness 06/15/2018  . Loud snoring 06/15/2018  . RLS (restless legs syndrome) 06/15/2018  . Right trigeminal neuralgia 06/15/2018  . Essential hypertension 09/20/2016  . History of colonic polyps 02/02/2016  . Diverticulitis of intestine without perforation or abscess without bleeding 02/02/2016  . Routine general medical examination at a health care facility 10/01/2015  . History of skin cancer 10/01/2015  . Hematuria, microscopic 09/10/2013  . GERD 12/19/2009  . HIATAL HERNIA 12/19/2009  . COLONIC POLYPS, HX OF 12/19/2009  . OSA (obstructive sleep apnea) 12/03/2009   . NEURALGIA, TRIGEMINAL 08/19/2008  . Depression 05/24/2007  . Diverticulosis of large intestine 05/24/2007  . WEIGHT GAIN 05/24/2007    Goals Addressed   None    Reviewed chart for medication changes ahead of medication coordination call. Reviewed OVs, Consults, or hospital visits since last care coordination call/Pharmacist visit. No medication changes indicated.  BP Readings from Last 3 Encounters:  07/01/20 120/70  05/07/20 120/72  10/16/19 102/65    Lab Results  Component Value Date   HGBA1C 6.2 (H) 07/04/2020     Patient obtains medications through Vials  90 Days  Last adherence delivery included: Marland Kitchen Triamterene-hydrochlorothiazide (DYAZIDE) 37.5-25 mg capsule daily  . Escitalopram (LEXAPRO) 10 mg tablet daily  Patient is due for next adherence delivery on: 07-30-2020. Called patient and reviewed medications and coordinated delivery. This delivery to include: . Triamterene-hydrochlorothiazide (DYAZIDE) 37.5-25 mg capsule daily  . Omeprazole (PRILOSEC) 20 mg capsule daily . Escitalopram (LEXAPRO) 10 mg tablet daily  Patient declined the following medication due to supply on hand. . Lamotrigine (LAMICTAL) 25 mg tablet: 3 tablets (75 mg total) twice daily  She does not need refills currently. Confirmed delivery date of 07-30-2020, advised patient that pharmacy will contact them the morning of delivery.   Follow-Up:  Coordination of Enhanced Pharmacy Services   Amilia ([place]) Mare Ferrari, [place] Assistant [phone]

## 2020-07-28 ENCOUNTER — Telehealth: Payer: Self-pay | Admitting: Adult Health

## 2020-07-28 NOTE — Telephone Encounter (Signed)
Left message for patient to call back and schedule Medicare Annual Wellness Visit (AWV) either virtually or in office.   Last AWV 10/19/2018   please schedule at anytime with [facility] Nurse Health Advisor 1 or 2   This should be a 45 minute visit.

## 2020-08-25 ENCOUNTER — Telehealth: Payer: Self-pay | Admitting: Pharmacist

## 2020-08-25 NOTE — Chronic Care Management (AMB) (Signed)
    Chronic Care Management Pharmacy Assistant   Name: Krystal Monroe  MRN: [id number] DOB: [birth year]  Reason for Encounter: Medication Review  PCP : [name], NP  Allergies:   Allergies  Allergen Reactions  . Iodine-131 Rash  . Other Hives, Rash and Other (See Comments)    Magnavist Contrast    Medications: Outpatient Encounter Medications as of 08/25/2020  Medication Sig  . cholecalciferol (VITAMIN D3) 25 MCG (1000 UNIT) tablet Take 1,000 Units by mouth 2 (two) times daily.  Marland Kitchen escitalopram (LEXAPRO) 10 MG tablet Take 1 tablet (10 mg total) by mouth daily.  Marland Kitchen lamoTRIgine (LAMICTAL) 25 MG tablet Take 3 tablets (75 mg total) by mouth 2 (two) times daily.  Marland Kitchen omeprazole (PRILOSEC) 20 MG capsule Take 1 capsule (20 mg total) by mouth daily.  Marland Kitchen triamterene-hydrochlorothiazide (DYAZIDE) 37.5-25 MG capsule Take 1 each (1 capsule total) by mouth daily.   No facility-administered encounter medications on file as of 08/25/2020.    Current Diagnosis: Patient Active Problem List   Diagnosis Date Noted  . Class 1 obesity due to excess calories without serious comorbidity with body mass index (BMI) of 31.0 to 31.9 in adult 06/15/2018  . Excessive daytime sleepiness 06/15/2018  . Loud snoring 06/15/2018  . RLS (restless legs syndrome) 06/15/2018  . Right trigeminal neuralgia 06/15/2018  . Essential hypertension 09/20/2016  . History of colonic polyps 02/02/2016  . Diverticulitis of intestine without perforation or abscess without bleeding 02/02/2016  . Routine general medical examination at a health care facility 10/01/2015  . History of skin cancer 10/01/2015  . Hematuria, microscopic 09/10/2013  . GERD 12/19/2009  . HIATAL HERNIA 12/19/2009  . COLONIC POLYPS, HX OF 12/19/2009  . OSA (obstructive sleep apnea) 12/03/2009  . NEURALGIA, TRIGEMINAL 08/19/2008  . Depression 05/24/2007  . Diverticulosis of large intestine 05/24/2007  . WEIGHT GAIN 05/24/2007    Goals Addressed    None    Reviewed chart for medication changes ahead of medication coordination call. No OVs, Consults, or hospital visits since last care coordination call/Pharmacist visit.  No medication changes indicated.  BP Readings from Last 3 Encounters:  07/01/20 120/70  05/07/20 120/72  10/16/19 102/65    Lab Results  Component Value Date   HGBA1C 6.2 (H) 07/04/2020     Patient obtains medications through Vials  90 Days  Last adherence delivery included:   Triamterene-hydrochlorothiazide (DYAZIDE) 37.5-25 mg capsule daily   Omeprazole (PRILOSEC) 20 mg capsule daily  Escitalopram (LEXAPRO) 10 mg tablet daily Patient declined the following medication last month due to additional supply on hand.  Lamotrigine (LAMICTAL) 25 mg tablet: 3 tablets (75 mg total) twice daily  I spoke with the patient and review medications. There are no changes in medications currently. Patient declined these last month due to supply on hand.The patient is taking the following medications:  Triamterene-hydrochlorothiazide (DYAZIDE) 37.5-25 mg capsule daily   Omeprazole (PRILOSEC) 20 mg capsule daily  Escitalopram (LEXAPRO) 10 mg tablet daily  Lamotrigine (LAMICTAL) 25 mg tablet: 3 tablets (75 mg total) twice daily  She currently does not need any refills Confirmed delivery date of 10/27/2020, advised patient that pharmacy will contact them the morning of delivery.  Follow-Up:  Coordination of Enhanced Pharmacy Services and Pharmacist Review   Maia Breslow, [place] Assistant [phone]

## 2020-09-16 ENCOUNTER — Telehealth: Payer: Self-pay | Admitting: Pharmacist

## 2020-09-16 NOTE — Chronic Care Management (AMB) (Signed)
    Chronic Care Management Pharmacy Assistant   Name: Krystal Monroe  MRN: [id number] DOB: 03-08-1951  Reason for Encounter: Medication Review  PCP : [name], NP  Allergies:   Allergies  Allergen Reactions  . Iodine-131 Rash  . Other Hives, Rash and Other (See Comments)    Magnavist Contrast    Medications: Outpatient Encounter Medications as of 09/16/2020  Medication Sig  . cholecalciferol (VITAMIN D3) 25 MCG (1000 UNIT) tablet Take 1,000 Units by mouth 2 (two) times daily.  Marland Kitchen escitalopram (LEXAPRO) 10 MG tablet Take 1 tablet (10 mg total) by mouth daily.  Marland Kitchen lamoTRIgine (LAMICTAL) 25 MG tablet Take 3 tablets (75 mg total) by mouth 2 (two) times daily.  Marland Kitchen omeprazole (PRILOSEC) 20 MG capsule Take 1 capsule (20 mg total) by mouth daily.  Marland Kitchen triamterene-hydrochlorothiazide (DYAZIDE) 37.5-25 MG capsule Take 1 each (1 capsule total) by mouth daily.   No facility-administered encounter medications on file as of 09/16/2020.    Current Diagnosis: Patient Active Problem List   Diagnosis Date Noted  . Class 1 obesity due to excess calories without serious comorbidity with body mass index (BMI) of 31.0 to 31.9 in adult 06/15/2018  . Excessive daytime sleepiness 06/15/2018  . Loud snoring 06/15/2018  . RLS (restless legs syndrome) 06/15/2018  . Right trigeminal neuralgia 06/15/2018  . Essential hypertension 09/20/2016  . History of colonic polyps 02/02/2016  . Diverticulitis of intestine without perforation or abscess without bleeding 02/02/2016  . Routine general medical examination at a health care facility 10/01/2015  . History of skin cancer 10/01/2015  . Hematuria, microscopic 09/10/2013  . GERD 12/19/2009  . HIATAL HERNIA 12/19/2009  . COLONIC POLYPS, HX OF 12/19/2009  . OSA (obstructive sleep apnea) 12/03/2009  . NEURALGIA, TRIGEMINAL 08/19/2008  . Depression 05/24/2007  . Diverticulosis of large intestine 05/24/2007  . WEIGHT GAIN 05/24/2007    Goals Addressed    None    Reviewed chart for medication changes ahead of medication coordination call. No OVs, Consults, or hospital visits since last care coordination call/Pharmacist visit. No medication changes indicated.  BP Readings from Last 3 Encounters:  07/01/20 120/70  05/07/20 120/72  10/16/19 102/65    Lab Results  Component Value Date   HGBA1C 6.2 (H) 07/04/2020     Patient obtains medications through Vials  90 Days   Last adherence delivery included:  Marland Kitchen Triamterene-hydrochlorothiazide (DYAZIDE) 37.5-25 mg capsule daily  . Omeprazole (PRILOSEC) 20 mg capsule daily . Escitalopram (LEXAPRO) 10 mg tablet daily  Patient declined the following medication last month due to additional supply on hand.  Lamotrigine (LAMICTAL) 25 mg tablet: 3 tablets (75 mg total) twice daily  Patient is due for next adherence delivery on: 09-22-2020. Called patient and reviewed medications and coordinated delivery. This delivery to include:  Lamotrigine (LAMICTAL) 25 mg tablet: 3 tablets (75 mg total) twice daily  I spoke with the patient and review medications. There are no changes in medications currently. Patient declined these last month due to PRN use/additional supply on hand.The patient is taking the following medications: . Triamterene-hydrochlorothiazide (DYAZIDE) 37.5-25 mg capsule daily  . Omeprazole (PRILOSEC) 20 mg capsule daily . Escitalopram (LEXAPRO) 10 mg tablet daily  She doe not needs refills.  Confirmed delivery date of 09-22-2020, advised patient that pharmacy will contact them the morning of delivery. Follow-Up:  Care Coordination with Outside Provider and Pharmacist Review  Maia Breslow, [place] Assistant [phone]

## 2020-10-01 ENCOUNTER — Other Ambulatory Visit: Payer: Self-pay

## 2020-10-01 ENCOUNTER — Ambulatory Visit: Payer: PPO | Admitting: Family Medicine

## 2020-10-01 ENCOUNTER — Encounter: Payer: Self-pay | Admitting: Family Medicine

## 2020-10-01 VITALS — BP 133/78 | HR 69 | Ht 63.0 in | Wt 169.0 lb

## 2020-10-01 DIAGNOSIS — G4733 Obstructive sleep apnea (adult) (pediatric): Secondary | ICD-10-CM

## 2020-10-01 DIAGNOSIS — G500 Trigeminal neuralgia: Secondary | ICD-10-CM

## 2020-10-01 MED ORDER — HYDROCODONE-ACETAMINOPHEN 10-325 MG PO TABS: 1.0000 | ORAL_TABLET | Freq: Four times a day (QID) | ORAL | 0 refills | Status: DC | PRN

## 2020-10-01 NOTE — Progress Notes (Signed)
PATIENT: Krystal Monroe DOB: 1950-12-05  REASON FOR VISIT: follow up HISTORY FROM: patient  Chief Complaint  Patient presents with  . 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 She has continues lamotrigine 75mg  BID. She was doing well until last week when she had a sudden flare. She reports pain was so severe on the right side of her face that she was unable to get out of bed for two days. She started medrol taper pack that has seemed to help. She feels "jacked up" on prednisone. She has used ice packs and hydrocodone that helps dull pain. She is still having a hard time brushing her teeth. She has tried and failed gabapentin, Lyrica and Trileptal.   She was seen by Dr Salomon Fick, [facility] neurology in 08/2018. She was advised to follow up if she chose to pursue microvascular decompression via Gamma Knife. She was initially hesitant to consider procedure as she felt symptoms were stable. She does wish to return to discuss options at this time.   She has not been able to tolerate CPAP or dental apppliance for OSA management. She will consider other options once TN managed.    05/07/2020 ALL:  Krystal Monroe is a 70 y.o. female here today for follow up for TN. She continues lamotrigine 75mg  BID. She has a prn rx for hydrocodone 10/325 (5 tablets) for breakthrough TN pain. She is doing well, overall. She did have a significant bout with trigeminal pain in July following initiation of oral appliance for OSA management. She reports pain was so severe that she was bed bound for a week. She did use hydrocodone that helped some. She has been totally pain free since August. Pain usually right sided.   She was referred to Dr Ron Parker for OSA unable to tolerate CPAP or original dental device with Dr Toy Cookey. She was using oral appliance but reports this causes exacerbations of pain. RLS  stable.   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  I still wanted her to see a neuro surgeon for possible intervention should this medication have not worked.  She saw Dr. Salomon Fick at [facility] and a PA in his clinic, she did very well and by this time she is considered an active patient should she decide to undergo any kind of gamma knife treatment.  At the time she was listed as having failed gabapentin, Trileptal, Lyrica gabapentin. The second part that she was second part that she was originally referred to for versus taking her sleep care over she had a diagnosis of obstructive sleep apnea she could not sleep however in the home sleep test that she was given years ago at low power, she reported that she snored horribly I performed an attended sleep study on January 20 years 2020 and the patient was diagnosed with mild obstructive sleep apnea and an AHI of 15.5, she also had RDI which is the snoring accentuated arousal at 18.1/h.  REM sleep seems not to have affected her overall apnea  frequency supine sleep however was significantly exacerbating the AHI to 52.7/h.  She had 36 minutes total sleep time with an oxygen desaturation.  It was felt that this constellation should be treated with oxygen CPAP.  Please noted that she had a lot of periodic limb movements at night she has restless legs and is twitching quite a bit. She has a lot of anxiety about the face mask, she tried a dental device with Dr Toy Cookey- and it did not work out.  She is wanting to change to Dr Ron Parker.   Please note that  she started taking some CBD oil, if this helps her high level of anxiety, I am OK with it.   Download CPAP - from; she discontinued CPAP  use. Referral to Dr Ron Parker pending.   I will give her written prescription for hydrocodone 5 tabs , PRN flair up of trigeminal neurolagia in case .  RLS , only in bed , not when sitting still. She reports its only happening in bed ,not before and not when sleeping on the sofa? No pain, just a feeling of not getting comfort. No twitching.  I am not sure it's truly RLS, and its not progressive. I will check for ferritin.    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 Carlisle Cater for transfer and continuity of sleep care. Seen 07-17-2018. 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 She has a history of trigeminal Neuralgia since 2012 , following an oral surgery. She has seen Dr. Erling Cruz and did well on trileptal - 300 mg tid po. She was for 2 years pain free and able to wean. In September she had a severe flair up, was unable to function, to drive, and restarted Trileptol.   She laid in bed for 5 days- took hydrocodone and Advil - and waited for the medication to start working.  Last Friday she had finally a day off pain and she is now able to see me here. She has still hyperesthesia. Dr. Erling Cruz had done 2 MRIs - She never was interested in gamma knife surgery.   2) Dr. Sherren Mocha also reported her RLS to be worked up here in the future.  She had not taken any medications.   3) She also suffers from seasonal allergies, tends to have bronchitis and coughing during the season and she developed pneumonia this year for the first time.  She saw Dr. Sherren Mocha for treatment and is on the way up, finally can sleep and breath. She suspects she has sleep apnea. She  Had a HST 3 years ago at [employer] and felt it was not a valid test.  She couldn't sleep the night of the test. She snores horribly,   Chief complaint according to patient :  For today , it's trigeminal neuralgia - plan for the future.   Sleep habits are as follows:  Dinnertime is usually between 5 and 6 PM, the patient will spend the evening hours before bedtime doing yard work, household chores, and she may read or watch TV later.  Her bedtime is around 9 PM and she feels very tired already by that time.  She sleeps in a separate bedroom from her husband because of her loud snoring.  She does watch TV in her bedroom otherwise the bedroom would be cool, quiet and dark.  The TV however stays on most of the  night. She wakes up frequently moves, tosses and turns she feels too hot in the bed but she reports that she does not have nocturia or at least no more than once at night.  Most recently she had a lot of trouble with coughing and she is just in the process of overcoming pneumonia.  She had taken some hydrocodone which also helps pain but works as a cough suppressant.  In these days she has woken up unrefreshed and unrestored.  He has a dry mouth in the morning, a dull more generalized throbbing headache that she wakes up with but not wake up from. Takes 2 excedrin in AM.  Naps when she can- after lunch naps last never more than 1 hour and make her even more groggy,    Sleep medical history and family sleep history:  No known family members with OSA, mother had dementia and passed at age 24, lung cancer , chain smoking, killed her  father.  Brother is healthy.   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  Patient has never smoked herself but was exposed to passive smoke growing up. She is also not an alcohol consumer, caffeine use coffee drinks iced tea and soda but not coffee.  Up to 3 caffeinated beverages a day. She works as a Marine scientist at [employer] but never had a night shift duty.   REVIEW OF SYSTEMS: Out of a complete 14 system review of symptoms, the patient complains only of the following symptoms, facial pain, restless legs, insomnia and all other reviewed systems are negative.    ALLERGIES: Allergies  Allergen  Reactions  . Iodine-131 Rash  . Other Hives, Rash and Other (See Comments)    Magnavist Contrast    HOME MEDICATIONS: Outpatient Medications Prior to Visit  Medication Sig Dispense Refill  . cholecalciferol (VITAMIN D3) 25 MCG (1000 UNIT) tablet Take 1,000 Units by mouth 2 (two) times daily.    Marland Kitchen escitalopram (LEXAPRO) 10 MG tablet Take 1 tablet (10 mg total) by mouth daily. 90 tablet 1  . lamoTRIgine (LAMICTAL) 25 MG tablet Take 3 tablets (75 mg total) by mouth 2 (two) times daily. 540 tablet 1  . omeprazole (PRILOSEC) 20 MG capsule Take 1 capsule (20 mg total) by mouth daily. 90 capsule 1  . triamterene-hydrochlorothiazide (DYAZIDE) 37.5-25 MG capsule Take 1 each (1 capsule total) by mouth daily. 90 capsule 3   No facility-administered medications prior to visit.    PAST MEDICAL HISTORY: Past Medical History:  Diagnosis Date  . Anxiety    son killed in MVA  . Asthma    as a child  . Basal cell carcinoma of nose   . Colon polyps    2011  . Depression    son killed in [place]  . Diverticulosis of colon    diverticulitits  . Gallstones   . GERD (gastroesophageal reflux disease)   . Hypertension   . Obesity   . Sleep apnea    mild per pt-no tx  . Trigeminal neuralgia     PAST SURGICAL HISTORY: Past Surgical History:  Procedure Laterality Date  . ABDOMINAL HYSTERECTOMY     1997  . CATARACT EXTRACTION Left 06/05/2020   per patient  . [place]   x 3  . CHOLECYSTECTOMY  2002  . COLONOSCOPY  last 03/15/2016  . EYE SURGERY    . ROTATOR CUFF REPAIR Right 2017  . TOTAL ABDOMINAL HYSTERECTOMY W/ BILATERAL SALPINGOOPHORECTOMY  2001  . UPPER GASTROINTESTINAL ENDOSCOPY      FAMILY HISTORY: Family History  Problem Relation Age of Onset  . Diabetes Maternal Grandmother   . Crohn's disease Father   . Lung cancer Father   . Breast cancer Mother   . Osteoporosis Mother   . Dementia Mother   . Diabetes Mother   . Hypertension Mother   . Colon  cancer Neg Hx   . Esophageal cancer Neg Hx   . Rectal cancer Neg Hx   . Stomach cancer Neg Hx   . Colon polyps Neg Hx     SOCIAL HISTORY: Social History   Socioeconomic History  . Marital status: Married    Spouse name: Not on file  . Number of children: 3  . Years of education: BSN  . Highest education level: Bachelor's degree (e.g., BA, AB, BS)  Occupational History  . 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 Smoking status: Never Smoker  . Smokeless tobacco: Never Used  Vaping Use  . Vaping Use: Never used  Substance and Sexual Activity  . Alcohol use: No    Alcohol/week: 0.0 standard drinks  . Drug use: No  . Sexual activity: Not on file  Other Topics Concern  . Not on file  Social History Narrative   Lives with husband    caffeine 2 a day      10/19/2018:   Lives with husband and 2 golden retrievers on one level   Has three children, one daughter, one son, and one deceased son s/p MVA.   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   Enjoys reading, needlework, painting            Social Determinants of Health   Financial Resource Strain: Low Risk   . Difficulty of Paying Living Expenses: Not hard at all  Food Insecurity: Not on file  Transportation Needs: No Transportation Needs  . Lack of Transportation (Medical): No  . Lack of Transportation (Non-Medical): No  Physical Activity: Not on file  Stress: Not on file  Social Connections: Not on file  Intimate Partner Violence: Not on file      PHYSICAL EXAM  Vitals:   10/01/20 1319  BP: 133/78  Pulse: 69  Weight: 169 lb (76.7 kg)  Height: $Remove'5\' 3"'XYEtLNj$  (1.6 m)   Body mass index is 29.94 kg/m.  Generalized: Well developed, in no acute distress  Cardiology: normal rate and rhythm, no murmur noted Respiratory: clear to auscultation bilaterally  Neurological examination  Mentation: Alert  oriented to time, place, history taking. Follows all commands speech and language fluent Cranial nerve II-XII: Pupils were equal round reactive to light. Extraocular movements were full, visual field were full  Motor: The motor testing reveals 5 over 5 strength of all 4 extremities. Good symmetric motor tone is noted throughout.  Gait and station: Gait is normal.    DIAGNOSTIC DATA (LABS, IMAGING, TESTING) - I reviewed patient records, labs, notes, testing and imaging myself where available.  MMSE - Mini Mental State Exam 12/28/2016  Not completed: (No Data)     Lab Results  Component Value Date   WBC 6.5 07/04/2020   HGB 13.4 07/04/2020   HCT 40.6 07/04/2020   MCV 86.4 07/04/2020   PLT 235 07/04/2020      Component Value Date/Time   NA 140 07/04/2020 0910  NA 125 (L) 06/15/2018 1520   K 4.6 07/04/2020 0910   CL 102 07/04/2020 0910   CO2 29 07/04/2020 0910   GLUCOSE 107 (H) 07/04/2020 0910   BUN 17 07/04/2020 0910   BUN 10 06/15/2018 1520   CREATININE 0.83 07/04/2020 0910   CALCIUM 9.9 07/04/2020 0910   PROT 7.2 07/04/2020 0910   ALBUMIN 4.6 06/14/2019 0847   AST 14 07/04/2020 0910   ALT 13 07/04/2020 0910   ALKPHOS 58 06/14/2019 0847   BILITOT 0.5 07/04/2020 0910   GFRNONAA 71 07/04/2020 0910   GFRAA 83 07/04/2020 0910   Lab Results  Component Value Date   CHOL 184 07/04/2020   HDL 41 (L) 07/04/2020   LDLCALC 106 (H) 07/04/2020   LDLDIRECT 94.0 06/14/2019   TRIG 241 (H) 07/04/2020   CHOLHDL 4.5 07/04/2020   Lab Results  Component Value Date   HGBA1C 6.2 (H) 07/04/2020   No results found for: VITAMINB12 Lab Results  Component Value Date   TSH 2.17 07/04/2020       ASSESSMENT AND PLAN 70 y.o. year old female  has a past medical history of Anxiety, Asthma, Basal cell carcinoma of nose, Colon polyps, Depression, Diverticulosis of colon, Gallstones, GERD (gastroesophageal reflux disease), Hypertension, Obesity, Sleep apnea, and Trigeminal neuralgia. here  with     ICD-10-CM   1. Right trigeminal neuralgia  G50.0   2. Obstructive sleep apnea  G47.33      Jakerria has had a recent flare of trigeminal pain. Prednisone and hydrocodone have helped. She is concerned about the unpredictability of recurrent pain and feels that it is severely debilitating. She has met with Dr Salomon Fick, [facility] neurosurgery. At the time of consult in 08/2018, she was stable on lamotrigine. Since, she has had multiple flares that have been extremely painful. She fears when another will come. We have discussed options for management with other oral agents versus interventional procedures. She would like to return to Dr Salomon Fick to discuss moving forward with interventional options. She will reach out to schedule follow up. May call me if new referral is needed. She will continue lamotrigine 75mg  twice daily. May continue hydrocodone for acute pain management. PDMP appropriate and prescription for 10 tablets given today. She has been unable to tolerate treatment for OSA. We have reviewed diagnosis and will continue discussion of treatment options once TN is better managed. She will continue healthy lifestyle habits and follow up with [place] pending consult with [facility] neurosurgery. She verbalizes understanding and agreement with this plan.    No orders of the defined types were placed in this encounter.    Meds ordered this encounter  Medications  . HYDROcodone-acetaminophen (NORCO) 10-325 MG tablet    Sig: Take 1 tablet by mouth every 6 (six) hours as needed.    Dispense:  10 tablet    Refill:  0    Order Specific Question:   Supervising Provider    Answer:   Melvenia Beam [id number]      I spent 25 minutes with the patient. 50% of this time was spent counseling and educating patient on plan of care and medications.     Debbora Presto, FNP-C 10/01/2020, 2:31 PM [facility] [address] [place], [place] [address] [phone]

## 2020-10-01 NOTE — Patient Instructions (Addendum)
Below is our plan:  We will have you follow up with Dr Salomon Fick to discuss surgical options for TN management. Please let me know if you need me!  Please make sure you are staying well hydrated. I recommend 50-60 ounces daily. Well balanced diet and regular exercise encouraged. Consistent sleep schedule with 6-8 hours recommended.   Please continue follow up with care team as directed.   Follow up pending visit with Dr Noemi Chapel may receive a survey regarding today's visit. I encourage you to leave honest feed back as I do use this information to improve patient care. Thank you for seeing me today!      Trigeminal Neuralgia  Trigeminal neuralgia is a nerve disorder that causes severe pain on one side of the face. The pain may last from a few seconds to several minutes. The pain is usually only on one side of the face. Symptoms may occur for days, weeks, or months and then go away for months or years. The pain may return and be worse than before. What are the causes? This condition is caused by damage or pressure to a nerve in the head that is called the trigeminal nerve. An attack can be triggered by:  Talking.  Chewing.  Putting on makeup.  Washing your face.  Shaving your face.  Brushing your teeth.  Touching your face. What increases the risk? You are more likely to develop this condition if you:  Are 67 years of age or older.  Are female. What are the signs or symptoms? The main symptom of this condition is severe pain in the:  Jaw.  Lips.  Eyes.  Nose.  Scalp.  Forehead.  Face. The pain may be:  Intense.  Stabbing.  Electric.  Shock-like. How is this diagnosed? This condition is diagnosed with a physical exam. A CT scan or an MRI may be done to rule out other conditions that can cause facial pain. How is this treated? This condition may be treated with:  Avoiding the things that trigger your symptoms.  Taking prescription medicines  (anticonvulsants).  Having surgery. This may be done in severe cases if other medical treatment does not provide relief.  Having procedures such as ablation, thermal, or radiation therapy. It may take up to one month for treatment to start relieving the pain. Follow these instructions at home: Managing pain  Learn as much as you can about how to manage your pain. Ask your health care provider if a pain specialist would be helpful.  Consider talking with a mental health care provider (psychologist) about how to cope with the pain.  Consider joining a pain support group. General instructions  Take over-the-counter and prescription medicines only as told by your health care provider.  Avoid the things that trigger your symptoms. It may help to: ? Chew on the unaffected side of your mouth. ? Avoid touching your face. ? Avoid blasts of hot or cold air.  Follow your treatment plan as told by your health care provider. This may include: ? Cognitive or behavioral therapy. ? Gentle, regular exercise. ? Meditation or yoga. ? Aromatherapy.  Keep all follow-up visits as told by your health care provider. You may need to be monitored closely to make sure treatment is working well for you. Where to find more information  Facial Pain Association: fpa-support.org Contact a health care provider if:  Your medicine is not helping your symptoms.  You have side effects from the medicine used for treatment.  You develop new, unexplained symptoms, such as: ? Double vision. ? Facial weakness. ? Facial numbness. ? Changes in hearing or balance.  You feel depressed. Get help right away if:  Your pain is severe and is not getting better.  You develop suicidal thoughts. If you ever feel like you may hurt yourself or others, or have thoughts about taking your own life, get help right away. You can go to your nearest emergency department or call:  Your local emergency services (911 in the  [place]  A suicide crisis helpline, such as the [place] at (564)852-4573. This is open 24 hours a day. Summary  Trigeminal neuralgia is a nerve disorder that causes severe pain on one side of the face. The pain may last from a few seconds to several minutes.  This condition is caused by damage or pressure to a nerve in the head that is called the trigeminal nerve.  Treatment may include avoiding the things that trigger your symptoms, taking medicines, or having surgery or procedures. It may take up to one month for treatment to start relieving the pain.  Avoid the things that trigger your symptoms.  Keep all follow-up visits as told by your health care provider. You may need to be monitored closely to make sure treatment is working well for you. This information is not intended to replace advice given to you by your health care provider. Make sure you discuss any questions you have with your health care provider. Document Revised: 06/19/2018 Document Reviewed: 06/19/2018 Elsevier Patient Education  2021 [employer].

## 2020-10-06 DIAGNOSIS — G500 Trigeminal neuralgia: Secondary | ICD-10-CM | POA: Diagnosis not present

## 2020-10-07 ENCOUNTER — Telehealth: Payer: Self-pay | Admitting: Adult Health

## 2020-10-07 NOTE — Telephone Encounter (Signed)
Left message for patient to call back and schedule Medicare Annual Wellness Visit (AWV) either virtually or in office. No detailed message left   Last AWV 10/19/2018  please schedule at anytime with [facility] Nurse Health Advisor 1 or 2   This should be a 45 minute visit.

## 2020-10-13 DIAGNOSIS — F419 Anxiety disorder, unspecified: Secondary | ICD-10-CM | POA: Diagnosis not present

## 2020-10-13 DIAGNOSIS — F32A Depression, unspecified: Secondary | ICD-10-CM | POA: Diagnosis not present

## 2020-10-13 DIAGNOSIS — K219 Gastro-esophageal reflux disease without esophagitis: Secondary | ICD-10-CM | POA: Diagnosis not present

## 2020-10-13 DIAGNOSIS — R7303 Prediabetes: Secondary | ICD-10-CM | POA: Diagnosis not present

## 2020-10-13 DIAGNOSIS — G4733 Obstructive sleep apnea (adult) (pediatric): Secondary | ICD-10-CM | POA: Diagnosis not present

## 2020-10-13 DIAGNOSIS — G500 Trigeminal neuralgia: Secondary | ICD-10-CM | POA: Diagnosis not present

## 2020-10-13 DIAGNOSIS — Z95828 Presence of other vascular implants and grafts: Secondary | ICD-10-CM | POA: Diagnosis not present

## 2020-10-13 DIAGNOSIS — R112 Nausea with vomiting, unspecified: Secondary | ICD-10-CM | POA: Diagnosis not present

## 2020-10-13 DIAGNOSIS — Z01818 Encounter for other preprocedural examination: Secondary | ICD-10-CM | POA: Diagnosis not present

## 2020-10-13 DIAGNOSIS — I10 Essential (primary) hypertension: Secondary | ICD-10-CM | POA: Diagnosis not present

## 2020-10-13 DIAGNOSIS — G2581 Restless legs syndrome: Secondary | ICD-10-CM | POA: Diagnosis not present

## 2020-10-14 ENCOUNTER — Telehealth: Payer: Self-pay | Admitting: Pharmacist

## 2020-10-14 NOTE — Chronic Care Management (AMB) (Signed)
Chronic Care Management Pharmacy Assistant   Name: Krystal Monroe  MRN: [id number] DOB: 02-Feb-1951  Reason for Encounter: Medication Review  Patient Questions: 1.  Have you seen any other providers since your last visit?  Marland Kitchen 10-01-20 Consults note Lomax, Amy, NP Neurology   2.  Any changes in your medicines or health?  . Hydrocodone-acetaminophen (NORCO) 10-325 MG tablet: every 6 hours (PRN) 02/16   PCP : [name], NP  Allergies:   Allergies  Allergen Reactions  . Iodine-131 Rash  . Other Hives, Rash and Other (See Comments)    Magnavist Contrast    Medications: Outpatient Encounter Medications as of 10/14/2020  Medication Sig  . cholecalciferol (VITAMIN D3) 25 MCG (1000 UNIT) tablet Take 1,000 Units by mouth 2 (two) times daily.  Marland Kitchen escitalopram (LEXAPRO) 10 MG tablet Take 1 tablet (10 mg total) by mouth daily.  Marland Kitchen HYDROcodone-acetaminophen (NORCO) 10-325 MG tablet Take 1 tablet by mouth every 6 (six) hours as needed.  . lamoTRIgine (LAMICTAL) 25 MG tablet Take 3 tablets (75 mg total) by mouth 2 (two) times daily.  Marland Kitchen omeprazole (PRILOSEC) 20 MG capsule Take 1 capsule (20 mg total) by mouth daily.  Marland Kitchen triamterene-hydrochlorothiazide (DYAZIDE) 37.5-25 MG capsule Take 1 each (1 capsule total) by mouth daily.   No facility-administered encounter medications on file as of 10/14/2020.    Current Diagnosis: Patient Active Problem List   Diagnosis Date Noted  . Class 1 obesity due to excess calories without serious comorbidity with body mass index (BMI) of 31.0 to 31.9 in adult 06/15/2018  . Excessive daytime sleepiness 06/15/2018  . Loud snoring 06/15/2018  . RLS (restless legs syndrome) 06/15/2018  . Right trigeminal neuralgia 06/15/2018  . Essential hypertension 09/20/2016  . History of colonic polyps 02/02/2016  . Diverticulitis of intestine without perforation or abscess without bleeding 02/02/2016  . Routine general medical examination at a health care facility  10/01/2015  . History of skin cancer 10/01/2015  . Hematuria, microscopic 09/10/2013  . GERD 12/19/2009  . HIATAL HERNIA 12/19/2009  . COLONIC POLYPS, HX OF 12/19/2009  . OSA (obstructive sleep apnea) 12/03/2009  . NEURALGIA, TRIGEMINAL 08/19/2008  . Depression 05/24/2007  . Diverticulosis of large intestine 05/24/2007  . WEIGHT GAIN 05/24/2007    Goals Addressed   None    Reviewed chart for medication changes ahead of medication coordination call.  BP Readings from Last 3 Encounters:  10/01/20 133/78  07/01/20 120/70  05/07/20 120/72    Lab Results  Component Value Date   HGBA1C 6.2 (H) 07/04/2020     Patient obtains medications through Vials  90 Days  Last adherence delivery included:  Lamotrigine (LAMICTAL) 25 mg tablet: 3 tablets (75 mg total) twice daily  Patient declined the following medication last month due to PRN use/additional supply on hand. . Triamterene-hydrochlorothiazide (DYAZIDE) 37.5-25 mg capsule daily  . Omeprazole (PRILOSEC) 20 mg capsule daily . Escitalopram (LEXAPRO) 10 mg tablet daily  Patient is due for next adherence delivery on: 10/24/2020. Called patient and reviewed medications and coordinated delivery. This delivery to include: . Triamterene-hydrochlorothiazide (DYAZIDE) 37.5-25 mg capsule daily  . Omeprazole (PRILOSEC) 20 mg capsule daily . Escitalopram (LEXAPRO) 10 mg tablet daily   Coordinated acute fill for  Lamotrigine (LAMICTAL) 25 mg tablet to be delivered 10/18/2020. She currently does not need refills currently.  Confirmed delivery date of 10/24/2020, advised patient that pharmacy will contact them the morning of delivery. Follow-Up:  Comptroller and Pharmacist Review  Maia Breslow, [place] Assistant [phone]

## 2020-10-15 DIAGNOSIS — T884XXA Failed or difficult intubation, initial encounter: Secondary | ICD-10-CM

## 2020-10-15 DIAGNOSIS — G500 Trigeminal neuralgia: Secondary | ICD-10-CM | POA: Diagnosis not present

## 2020-10-15 HISTORY — DX: Failed or difficult intubation, initial encounter: T88.4XXA

## 2020-10-17 ENCOUNTER — Other Ambulatory Visit: Payer: Self-pay | Admitting: Adult Health

## 2020-10-17 DIAGNOSIS — F419 Anxiety disorder, unspecified: Secondary | ICD-10-CM

## 2020-10-17 DIAGNOSIS — F32A Depression, unspecified: Secondary | ICD-10-CM

## 2020-10-24 ENCOUNTER — Ambulatory Visit: Payer: Medicare Other

## 2020-10-28 DIAGNOSIS — Z48811 Encounter for surgical aftercare following surgery on the nervous system: Secondary | ICD-10-CM | POA: Diagnosis not present

## 2020-11-14 ENCOUNTER — Telehealth: Payer: Self-pay | Admitting: Pharmacist

## 2020-11-14 NOTE — Chronic Care Management (AMB) (Addendum)
    Chronic Care Management Pharmacy Assistant   Name: Krystal Monroe  MRN: [id number] DOB: 05/01/1951  Reason for Encounter: Medication Review-Medication Coordination Call   Recent office visits:  None  Recent consult visits:  03.15.2022 Kary Kos, [facility] Neurosurgery   [facility] visits:  Medication Reconciliation was completed by comparing discharge summary, patient's EMR and Pharmacy list, and upon discussion with patient.  Admitted to the hospital on 03.02.2022 due to Right suboccipital craniotomy for microvascular decompression. Discharge date was 03.04.2022. Discharged from [facility].    New?Medications Started at [facility] Discharge:?? -started  Acetaminophen (TYLENOL) 325 MG tablet Take 2 tablets (650 mg total) by mouth every 6 (six) hours as needed for Mild pain (1-3) or Fever (PRN Fever >100.4).  Diphenhydramine (BENADRYL) 25 mg capsule Take 1 capsule (25 mg total) by mouth every 6 (six) hours as needed for up to 10 days for Itching or Allergies.  Hydrocodone-acetaminophen (NORCO) 5-325 mg per tablet Take 1-2 tablets by mouth every 6 (six) hours as needed for up to 7 days (1 for moderate; 2 for severe POST OP pain). Qty: 30 tablets Polyethylene glycol (MIRALAX) 17-gram packet Take 17 g by mouth daily as needed. Senna-docusate (PERICOLACE, SENOKOT-S) 8.6-50 mg per tablet Take 2 tablets by mouth at bedtime. While on narcotic pain medication   Medication Changes at Hospital Discharge: Lamotrigine (LAMICTAL) 25 MG tablet 3/4-3/10: 2 tabs in AM, 3 tabs in PM. 3/11-3/17: 2 tabs 2x/day. 3/18-3/24: 1 tab in AM, 2 in PM. 3/25-3/31: 1 tab 2x/day. 4/1-4/7: 1 tab nightly. 4/8: stop.   Medications that remain the same after Hospital Discharge:??  -All other medications will remain the same.    Medications: Outpatient Encounter Medications as of 11/14/2020  Medication Sig   cholecalciferol (VITAMIN D3) 25 MCG (1000 UNIT) tablet Take 1,000 Units by mouth  2 (two) times daily.   escitalopram (LEXAPRO) 10 MG tablet TAKE ONE TABLET BY MOUTH ONCE DAILY   HYDROcodone-acetaminophen (NORCO) 10-325 MG tablet Take 1 tablet by mouth every 6 (six) hours as needed.   lamoTRIgine (LAMICTAL) 25 MG tablet Take 3 tablets (75 mg total) by mouth 2 (two) times daily.   omeprazole (PRILOSEC) 20 MG capsule Take 1 capsule (20 mg total) by mouth daily.   triamterene-hydrochlorothiazide (DYAZIDE) 37.5-25 MG capsule Take 1 each (1 capsule total) by mouth daily.   No facility-administered encounter medications on file as of 11/14/2020.   Reviewed chart for medication changes ahead of medication coordination call.  BP Readings from Last 3 Encounters:  10/01/20 133/78  07/01/20 120/70  05/07/20 120/72    Lab Results  Component Value Date   HGBA1C 6.2 (H) 07/04/2020    Patient obtains medications through Vials  90 Days  Last adherence delivery included:   Lamotrigine (LAMICTAL) 25 mg tablet: 3 tablets (75 mg total) twice daily  Triamterene-hydrochlorothiazide (DYAZIDE) 37.5-25 mg capsule daily   Omeprazole (PRILOSEC) 20 mg capsule daily  Escitalopram (LEXAPRO) 10 mg tablet daily  I spoke with the patient and review medications. There are no changes in medications currently. Patient declined these medication this month due to PRN use/additional supply on hand.The patient is taking the following medications: 90 days supply filled on 03.10.202 Triamterene-hydrochlorothiazide (DYAZIDE) 37.5-25 mg capsule daily  Omeprazole (PRILOSEC) 20 mg capsule daily Escitalopram (LEXAPRO) 10 mg tablet daily  She currently does not needed refills.   Krystal Monroe, [place] [phone]

## 2020-11-17 ENCOUNTER — Telehealth: Payer: Self-pay | Admitting: Pharmacist

## 2020-11-17 ENCOUNTER — Ambulatory Visit: Payer: Medicare Other

## 2020-11-18 ENCOUNTER — Ambulatory Visit: Payer: Medicare Other

## 2020-11-18 NOTE — Chronic Care Management (AMB) (Signed)
    Chronic Care Management Pharmacy Assistant   Name: Krystal Monroe  MRN: [id number] DOB: 1951-08-01  04.04.2022- Left the patient a message to call back. 04.05.2022- I spoke with the patient and rescheduled her Appointment for May 25th 1:00pm.  Medications: Outpatient Encounter Medications as of 11/17/2020  Medication Sig  . cholecalciferol (VITAMIN D3) 25 MCG (1000 UNIT) tablet Take 1,000 Units by mouth 2 (two) times daily.  Marland Kitchen escitalopram (LEXAPRO) 10 MG tablet TAKE ONE TABLET BY MOUTH ONCE DAILY  . HYDROcodone-acetaminophen (NORCO) 10-325 MG tablet Take 1 tablet by mouth every 6 (six) hours as needed.  . lamoTRIgine (LAMICTAL) 25 MG tablet Take 3 tablets (75 mg total) by mouth 2 (two) times daily.  Marland Kitchen triamterene-hydrochlorothiazide (DYAZIDE) 37.5-25 MG capsule Take 1 each (1 capsule total) by mouth daily.   No facility-administered encounter medications on file as of 11/17/2020.    Maia Breslow, [place] [phone]

## 2021-01-06 ENCOUNTER — Telehealth: Payer: Self-pay | Admitting: Pharmacist

## 2021-01-06 ENCOUNTER — Ambulatory Visit: Payer: Medicare Other

## 2021-01-06 DIAGNOSIS — Z961 Presence of intraocular lens: Secondary | ICD-10-CM | POA: Diagnosis not present

## 2021-01-06 DIAGNOSIS — H2511 Age-related nuclear cataract, right eye: Secondary | ICD-10-CM | POA: Diagnosis not present

## 2021-01-06 NOTE — Chronic Care Management (AMB) (Signed)
    Chronic Care Management Pharmacy Assistant   Name: Krystal Monroe  MRN: [id number] DOB: 06/01/1951  01/06/21- Patient called to remind of appointment with (CPP) on (01/07/21 at 1pm in person)  Patient aware of appointment date, time, and type of appointment (either telephone or in person). Patient aware to have/bring all medications, supplements, blood pressure and/or blood sugar logs to visit.  Questions: Have you had any recent office visit or specialist visit outside of [place]?   Yes. Patient had a surgery for microvascular compression at [facility] by Dr. Yolanda Bonine ( Neurosurgeon). Performed 10/15/20.   Are there any concerns you would like to discuss during your office visit?  No. Patient states everything is going good.   Are you having any problems obtaining your medications? (Whether it pharmacy issues or cost)  No. Patient states no issues at this time.   If patient has any PAP medications ask if they are having any problems getting their PAP medication or refill?   Patient does not receive patient assistance for nay of her medications.    Star Rating Drugs:   Triamterene-hydrochlorothiazide 37.5-25mg  - last filled on 10/23/20 at [facility].  Any gaps in medications fill history? NoHoover Browns CMA  Clinical Pharmacist Assistant [phone]

## 2021-01-07 ENCOUNTER — Ambulatory Visit (INDEPENDENT_AMBULATORY_CARE_PROVIDER_SITE_OTHER): Payer: PPO | Admitting: Pharmacist

## 2021-01-07 ENCOUNTER — Other Ambulatory Visit: Payer: Self-pay

## 2021-01-07 DIAGNOSIS — G2581 Restless legs syndrome: Secondary | ICD-10-CM

## 2021-01-07 DIAGNOSIS — I10 Essential (primary) hypertension: Secondary | ICD-10-CM | POA: Diagnosis not present

## 2021-01-07 DIAGNOSIS — F419 Anxiety disorder, unspecified: Secondary | ICD-10-CM

## 2021-01-07 NOTE — Progress Notes (Signed)
Chronic Care Management Pharmacy Note  01/13/2021 Name:  Krystal Monroe MRN:  [id number] DOB:  03-Feb-1951  Subjective: Krystal Monroe is an 70 y.o. year old female who is a primary patient of [name], NP.  The CCM team was consulted for assistance with disease management and care coordination needs.    Engaged with patient face to face for follow up visit in response to provider referral for pharmacy case management and/or care coordination services.   Consent to Services:  The patient was given information about Chronic Care Management services, agreed to services, and gave verbal consent prior to initiation of services.  Please see initial visit note for detailed documentation.   Patient Care Team: [name], NP as PCP - General (Family Medicine) Dohmeier, Asencion Partridge, MD as Consulting Physician (Neurology) [name], MD as Consulting Physician (Ophthalmology) Viona Gilmore, [facility] as Pharmacist (Pharmacist)  Recent office visits: 07/01/20 [name], NP: Patient presented for annual exam.  Recent consult visits: 11/17/20 Angelene Giovanni, MD (neurosurgery): Patient presented for post op visit.   10/28/20 Waldon Reining, PA-C (neurosurgery): Patient presented for post op visit. Stitches removed.  10/01/20 Debbora Presto, NP (neurology): Patient presented for trigeminal neuralgia follow up. Scheduled visit with Dr. Salomon Fick to discuss surgical options.  Hospital visits: 3/2-10/17/20 Patient admitted for craniotomy MVD procedure.  Objective:  Lab Results  Component Value Date   CREATININE 0.83 07/04/2020   BUN 17 07/04/2020   GFR 74.14 06/14/2019   GFRNONAA 71 07/04/2020   GFRAA 83 07/04/2020   NA 140 07/04/2020   K 4.6 07/04/2020   CALCIUM 9.9 07/04/2020   CO2 29 07/04/2020   GLUCOSE 107 (H) 07/04/2020    Lab Results  Component Value Date/Time   HGBA1C 6.2 (H) 07/04/2020 09:10 AM   GFR 74.14 06/14/2019 08:47 AM   GFR 107.50 04/24/2018 04:17 PM    Last diabetic  Eye exam: No results found for: HMDIABEYEEXA  Last diabetic Foot exam: No results found for: HMDIABFOOTEX   Lab Results  Component Value Date   CHOL 184 07/04/2020   HDL 41 (L) 07/04/2020   LDLCALC 106 (H) 07/04/2020   LDLDIRECT 94.0 06/14/2019   TRIG 241 (H) 07/04/2020   CHOLHDL 4.5 07/04/2020    Hepatic Function Latest Ref Rng & Units 07/04/2020 06/14/2019 04/24/2018  Total Protein 6.1 - 8.1 g/dL 7.2 6.9 7.4  Albumin 3.5 - 5.2 g/dL - 4.6 4.5  AST 10 - 35 U/L _0 ALT 6 - 29 U/L _1 Alk Phosphatase 39 - 117 U/L - 58 55  Total Bilirubin 0.2 - 1.2 mg/dL 0.5 0.7 0.4  Bilirubin, Direct 0.0 - 0.3 mg/dL - - 0.1    Lab Results  Component Value Date/Time   TSH 2.17 07/04/2020 09:10 AM   TSH 1.41 06/14/2019 08:47 AM    CBC Latest Ref Rng & Units 07/04/2020 06/14/2019 06/15/2018  WBC 3.8 - 10.8 Thousand/uL 6.5 7.7 9.6  Hemoglobin 11.7 - 15.5 g/dL 13.4 13.2 12.7  Hematocrit 35.0 - 45.0 % 40.6 38.9 37.0  Platelets 140 - 400 Thousand/uL 235 234.0 287    No results found for: VD25OH  Clinical ASCVD: No  The 10-year ASCVD risk score Mikey Bussing DC Jr., et al., 2013) is: 12.7%   Values used to calculate the score:     Age: 40 years     Sex: Female     Is Non-Hispanic African American: No     Diabetic: No  Tobacco smoker: No     Systolic Blood Pressure: 660 mmHg     Is BP treated: Yes     HDL Cholesterol: 41 mg/dL     Total Cholesterol: 184 mg/dL    Depression screen [facility] 2/9 10/19/2018 04/18/2018 01/17/2017  Decreased Interest 0 0 0  Down, Depressed, Hopeless 0 0 0  PHQ - 2 Score 0 0 0  Altered sleeping 0 - -  Tired, decreased energy 0 - -  Change in appetite 0 - -  Feeling bad or failure about yourself  0 - -  Trouble concentrating 0 - -  Moving slowly or fidgety/restless 0 - -  Suicidal thoughts 0 - -  PHQ-9 Score 0 - -      Social History   Tobacco Use  Smoking Status Never Smoker  Smokeless Tobacco Never Used   BP Readings from Last 3 Encounters:  10/01/20  133/78  07/01/20 120/70  05/07/20 120/72   Pulse Readings from Last 3 Encounters:  10/01/20 69  07/01/20 67  05/07/20 75   Wt Readings from Last 3 Encounters:  10/01/20 169 lb (76.7 kg)  07/01/20 174 lb 6.4 oz (79.1 kg)  05/07/20 174 lb 9.6 oz (79.2 kg)   BMI Readings from Last 3 Encounters:  10/01/20 29.94 kg/m  07/01/20 30.41 kg/m  05/07/20 30.44 kg/m    Assessment/Interventions: Review of patient past medical history, allergies, medications, health status, including review of consultants reports, laboratory and other test data, was performed as part of comprehensive evaluation and provision of chronic care management services.   SDOH:  (Social Determinants of Health) assessments and interventions performed: No  SDOH Screenings   Alcohol Screen: Not on file  Depression (YTK1-6): Not on file  Financial Resource Strain: Low Risk   . Difficulty of Paying Living Expenses: Not hard at all  Food Insecurity: Not on file  Housing: Not on file  Physical Activity: Not on file  Social Connections: Not on file  Stress: Not on file  Tobacco Use: Low Risk   . Smoking Tobacco Use: Never Smoker  . Smokeless Tobacco Use: Never Used  Transportation Needs: No Transportation Needs  . Lack of Transportation (Medical): No  . Lack of Transportation (Non-Medical): No    CCM Care Plan  Allergies  Allergen Reactions  . Iodine-131 Rash  . Other Hives, Rash and Other (See Comments)    Magnavist Contrast    Medications Reviewed Today    Reviewed by Viona Gilmore, [facility] (Pharmacist) on 01/07/21 at 78  Med List Status: <None>  Medication Order Taking? Sig Documenting Provider Last Dose Status Informant  cholecalciferol (VITAMIN D3) 25 MCG (1000 UNIT) tablet [id number] Yes Take 1,000 Units by mouth 2 (two) times daily. [provider] Taking Active   escitalopram (LEXAPRO) 10 MG tablet [id number] Yes TAKE ONE TABLET BY MOUTH ONCE DAILY Nafziger, Tommi Rumps, NP Taking Active    omeprazole (PRILOSEC) 20 MG capsule [id number]  Take 1 capsule (20 mg total) by mouth daily. [name], NP  Expired 08/19/20 2359   triamterene-hydrochlorothiazide (DYAZIDE) 37.5-25 MG capsule [id number] Yes Take 1 each (1 capsule total) by mouth daily. [name], NP Taking Active           Patient Active Problem List   Diagnosis Date Noted  . Class 1 obesity due to excess calories without serious comorbidity with body mass index (BMI) of 31.0 to 31.9 in adult 06/15/2018  . Excessive daytime sleepiness 06/15/2018  . Loud snoring 06/15/2018  . RLS (  restless legs syndrome) 06/15/2018  . Right trigeminal neuralgia 06/15/2018  . Essential hypertension 09/20/2016  . History of colonic polyps 02/02/2016  . Diverticulitis of intestine without perforation or abscess without bleeding 02/02/2016  . Routine general medical examination at a health care facility 10/01/2015  . History of skin cancer 10/01/2015  . Hematuria, microscopic 09/10/2013  . GERD 12/19/2009  . HIATAL HERNIA 12/19/2009  . COLONIC POLYPS, HX OF 12/19/2009  . OSA (obstructive sleep apnea) 12/03/2009  . NEURALGIA, TRIGEMINAL 08/19/2008  . Depression 05/24/2007  . Diverticulosis of large intestine 05/24/2007  . WEIGHT GAIN 05/24/2007    Immunization History  Administered Date(s) Administered  . Fluad Quad(high Dose 65+) 06/13/2019, 07/01/2020  . Influenza Split 05/22/2012, 05/16/2013  . Influenza Whole 08/16/2006  . Influenza, High Dose Seasonal PF 04/24/2018  . Influenza-Unspecified 04/26/2014, 05/09/2015, 03/22/2016  . PFIZER(Purple Top)SARS-COV-2 Vaccination 09/30/2019, 10/23/2019  . Pneumococcal Conjugate-13 10/01/2015  . Pneumococcal Polysaccharide-23 12/28/2016  . Td 08/16/1998, 10/11/2008  . Zoster, Live 02/25/2011    Conditions to be addressed/monitored:  Hypertension, GERD, Depression, Osteopenia and Trigeminal neuralgia  Care Plan : CCM Pharmacy Care Plan  Updates made by Pryor, Madeline G,  RPH since 01/13/2021 12:00 AM    Problem: Problem: Hypertension, GERD, Depression, Osteopenia and Trigeminal neuralgia     Long-Range Goal: Patient-Specific Goal   Start Date: 01/07/2021  Expected End Date: 01/07/2022  This Visit's Progress: On track  Priority: High  Note:   Current Barriers:  . Unable to independently monitor therapeutic efficacy . Unable to achieve control of restless legs syndrome   Pharmacist Clinical Goal(s):  . Patient will achieve adherence to monitoring guidelines and medication adherence to achieve therapeutic efficacy through collaboration with PharmD and provider.   Interventions: . 1:1 collaboration with Nafziger, Cory, NP regarding development and update of comprehensive plan of care as evidenced by provider attestation and co-signature . Inter-disciplinary care team collaboration (see longitudinal plan of care) . Comprehensive medication review performed; medication list updated in electronic medical record  Hypertension (BP goal <140/90) -Controlled -Current treatment: . Triamterene-HCTZ 37.5-25 mg daily -Medications previously tried: none -Current home readings: 116-126/70s; 124/76 this morning -Current dietary habits: did not discuss -Current exercise habits: did not discuss -Denies hypotensive/hypertensive symptoms -Educated on BP goals and benefits of medications for prevention of heart attack, stroke and kidney damage; Exercise goal of 150 minutes per week; Importance of home blood pressure monitoring; -Counseled to monitor BP at home weekly, document, and provide log at future appointments -Counseled on diet and exercise extensively Recommended to continue current medication Recommended setting alarms to remember to take medications  Depression/Anxiety (Goal: minimize symptoms) -Controlled -Current treatment: . Escitalopram 20 mg daily (takes 1/2 tablet)  -Medications previously tried/failed: n/a -PHQ9: 0 -GAD7: n/a -Educated on  Benefits of medication for symptom control -Recommended to continue current medication Recommended setting an alarm as a reminder to take  Osteopenia (Goal prevent fractures) -Controlled -Last DEXA Scan: 11/21/16              T-Score femoral neck: -2.0 (L)             T-Score total hip: -1.0             T-Score lumbar spine: -1.3             T-Score forearm radius: n/a             10-year probability of major osteoporotic fracture: 18.9%               10-year probability of hip fracture: 2.6% -Patient is not a candidate for pharmacologic treatment -Current treatment  . Vitamin D 2000 units once daily  -Medications previously tried: none  -Recommend [phone] units of vitamin D daily. Recommend 1200 mg of calcium daily from dietary and supplemental sources. Recommend weight-bearing and muscle strengthening exercises for building and maintaining bone density. -Recommended finding a calcium with magnesium and zinc product to avoid constipation  GERD (Goal: minimize symptoms) -Controlled -Current treatment  . Omeprazole every other day -Medications previously tried: none  -Recommended switching to every third day to continue the taper.  Trigeminal neuralgia (Goal: minimize symptoms) -Controlled -Current treatment  . No medications -Medications previously tried: Lamictal,  Gabapentin, Trileptal, Lyrica -Recommended continue without medications  Restless legs syndrome (Goal: minimize symptoms) -Not ideally controlled -Current treatment  . No medications -Medications previously tried: none  -Recommended rechecking vitamin B12 to rule out deficiency. Discussed first line medications for restless legs syndrome including gabapentin.   Health Maintenance -Vaccine gaps: Shingrix, COVID booster, tetanus -Current therapy:  . Psyllium daily as needed (not taking daily) -Educated on Cost vs benefit of each product must be carefully weighed by individual consumer -Patient is satisfied with  current therapy and denies issues -Counseled on taking daily to see if this helps with GI issues. Then consider probiotic such as Align to try.  Patient Goals/Self-Care Activities . Patient will:  - take medications as prescribed focus on medication adherence by setting alarm reminders check blood pressure weekly, document, and provide at future appointments target a minimum of 150 minutes of moderate intensity exercise weekly  Follow Up Plan: Telephone follow up appointment with care management team member scheduled for: 6 months       Medication Assistance: None required.  Patient affirms current coverage meets needs.  Compliance/Adherence/Medication fill history: Care Gaps: None  Star-Rating Drugs: None  Patient's preferred pharmacy is:  [place] [facility], [address] [place] [address] Phone: [phone] Fax: [phone]  [facility] - [place], [place] - [address] [place] [address] Phone: [phone] Fax: [phone]  Uses pill box? Yes Pt endorses 80% compliance  We discussed: Benefits of medication synchronization, packaging and delivery as well as enhanced pharmacist oversight with [facility]. Patient decided to: Utilize [facility] pharmacy for medication synchronization, packaging and delivery  Care Plan and Follow Up Patient Decision:  Patient agrees to Care Plan and Follow-up.  Plan: Face to Face appointment with care management team member scheduled for: 6 months  Jeni Salles, PharmD [facility] Pharmacist [place] at [place] [phone]

## 2021-01-13 NOTE — Patient Instructions (Signed)
Hi Asma,  It was great to get to meet you in person! Below is a summary of some of the topics we discussed. Don't forget to go ahead and decrease your omeprazole to every third day, start taking calcium & magnesium & zinc (with 300 mg of calcium in it) to see if this helps with constipation and possibly the restless legs.   I also think you should look into getting your shingles vaccine series at the pharmacy as well!  Please reach out to me if you have any questions or need anything before our follow up!  Best, Maddie  Jeni Salles, PharmD, [facility] [place]  Visit Information  Goals Addressed   None    Patient Care Plan: [facility] Pharmacy Care Plan    Problem Identified: Problem: Hypertension, GERD, Depression, Osteopenia and Trigeminal neuralgia     Long-Range Goal: Patient-Specific Goal   Start Date: 01/07/2021  Expected End Date: 01/07/2022  This Visit's Progress: On track  Priority: High  Note:   Current Barriers:  . Unable to independently monitor therapeutic efficacy . Unable to achieve control of restless legs syndrome   Pharmacist Clinical Goal(s):  Marland Kitchen Patient will achieve adherence to monitoring guidelines and medication adherence to achieve therapeutic efficacy through collaboration with PharmD and provider.   Interventions: . 1:1 collaboration with [name], NP regarding development and update of comprehensive plan of care as evidenced by provider attestation and co-signature . Inter-disciplinary care team collaboration (see longitudinal plan of care) . Comprehensive medication review performed; medication list updated in electronic medical record  Hypertension (BP goal <140/90) -Controlled -Current treatment: . Triamterene-HCTZ 37.5-25 mg daily -Medications previously tried: none -Current home readings: 116-126/70s; 124/76 this morning -Current dietary habits: did not discuss -Current exercise  habits: did not discuss -Denies hypotensive/hypertensive symptoms -Educated on BP goals and benefits of medications for prevention of heart attack, stroke and kidney damage; Exercise goal of 150 minutes per week; Importance of home blood pressure monitoring; -Counseled to monitor BP at home weekly, document, and provide log at future appointments -Counseled on diet and exercise extensively Recommended to continue current medication Recommended setting alarms to remember to take medications  Depression/Anxiety (Goal: minimize symptoms) -Controlled -Current treatment: . Escitalopram 20 mg daily (takes 1/2 tablet)  -Medications previously tried/failed: n/a -PHQ9: 0 -GAD7: n/a -Educated on Benefits of medication for symptom control -Recommended to continue current medication Recommended setting an alarm as a reminder to take  Osteopenia (Goal prevent fractures) -Controlled -Last DEXA Scan: 11/21/16              T-Score femoral neck: -2.0 (L)             T-Score total hip: -1.0             T-Score lumbar spine: -1.3             T-Score forearm radius: n/a             10-year probability of major osteoporotic fracture: 18.9%             10-year probability of hip fracture: 2.6% -Patient is not a candidate for pharmacologic treatment -Current treatment  . Vitamin D 2000 units once daily  -Medications previously tried: none  -Recommend [phone] units of vitamin D daily. Recommend 1200 mg of calcium daily from dietary and supplemental sources. Recommend weight-bearing and muscle strengthening exercises for building and maintaining bone density. -Recommended finding a calcium with magnesium and zinc product to avoid  constipation  GERD (Goal: minimize symptoms) -Controlled -Current treatment  . Omeprazole every other day -Medications previously tried: none  -Recommended switching to every third day to continue the taper.  Trigeminal neuralgia (Goal: minimize  symptoms) -Controlled -Current treatment  . No medications -Medications previously tried: Lamictal,  Gabapentin, Trileptal, Lyrica -Recommended continue without medications  Restless legs syndrome (Goal: minimize symptoms) -Not ideally controlled -Current treatment  . No medications -Medications previously tried: none  -Recommended rechecking vitamin B12 to rule out deficiency. Discussed first line medications for restless legs syndrome including gabapentin.   Health Maintenance -Vaccine gaps: Shingrix, COVID booster, tetanus -Current therapy:  . Psyllium daily as needed (not taking daily) -Educated on Cost vs benefit of each product must be carefully weighed by individual consumer -Patient is satisfied with current therapy and denies issues -Counseled on taking daily to see if this helps with GI issues. Then consider probiotic such as Align to try.  Patient Goals/Self-Care Activities . Patient will:  - take medications as prescribed focus on medication adherence by setting alarm reminders check blood pressure weekly, document, and provide at future appointments target a minimum of 150 minutes of moderate intensity exercise weekly  Follow Up Plan: Telephone follow up appointment with care management team member scheduled for: 6 months       Patient verbalizes understanding of instructions provided today and agrees to view in [place].  Face to Face appointment with pharmacist scheduled for:  6 months  Viona Gilmore, [facility]

## 2021-01-14 ENCOUNTER — Ambulatory Visit (INDEPENDENT_AMBULATORY_CARE_PROVIDER_SITE_OTHER): Payer: PPO

## 2021-01-14 DIAGNOSIS — Z0000 Encounter for general adult medical examination without abnormal findings: Secondary | ICD-10-CM

## 2021-01-14 NOTE — Patient Instructions (Signed)
Krystal Monroe , Thank you for taking time to come for your Medicare Wellness Visit. I appreciate your ongoing commitment to your health goals. Please review the following plan we discussed and let me know if I can assist you in the future.   Screening recommendations/referrals: Colonoscopy: current 05/23/2019 due 2023 Mammogram: scheduled 01/15/2021 Bone Density:current  12/04/2016 due 2024 Recommended yearly ophthalmology/optometry visit for glaucoma screening and checkup Recommended yearly dental visit for hygiene and checkup  Vaccinations: Influenza vaccine: due in fall 2022 Pneumococcal vaccine: completed series  Tdap vaccine: due upon injury  Shingles vaccine: will obtain local pharmacy   Advanced directives: will provide copies   Conditions/risks identified: none   Next appointment: none    Preventive Care 72 Years and Older, Female Preventive care refers to lifestyle choices and visits with your health care provider that can promote health and wellness. What does preventive care include?  A yearly physical exam. This is also called an annual well check.  Dental exams once or twice a year.  Routine eye exams. Ask your health care provider how often you should have your eyes checked.  Personal lifestyle choices, including:  Daily care of your teeth and gums.  Regular physical activity.  Eating a healthy diet.  Avoiding tobacco and drug use.  Limiting alcohol use.  Practicing safe sex.  Taking low-dose aspirin every day.  Taking vitamin and mineral supplements as recommended by your health care provider. What happens during an annual well check? The services and screenings done by your health care provider during your annual well check will depend on your age, overall health, lifestyle risk factors, and family history of disease. Counseling  Your health care provider may ask you questions about your:  Alcohol use.  Tobacco use.  Drug use.  Emotional  well-being.  Home and relationship well-being.  Sexual activity.  Eating habits.  History of falls.  Memory and ability to understand (cognition).  Work and work Statistician.  Reproductive health. Screening  You may have the following tests or measurements:  Height, weight, and BMI.  Blood pressure.  Lipid and cholesterol levels. These may be checked every 5 years, or more frequently if you are over 2 years old.  Skin check.  Lung cancer screening. You may have this screening every year starting at age 66 if you have a 30-pack-year history of smoking and currently smoke or have quit within the past 15 years.  Fecal occult blood test (FOBT) of the stool. You may have this test every year starting at age 49.  Flexible sigmoidoscopy or colonoscopy. You may have a sigmoidoscopy every 5 years or a colonoscopy every 10 years starting at age 56.  Hepatitis C blood test.  Hepatitis B blood test.  Sexually transmitted disease (STD) testing.  Diabetes screening. This is done by checking your blood sugar (glucose) after you have not eaten for a while (fasting). You may have this done every 1-3 years.  Bone density scan. This is done to screen for osteoporosis. You may have this done starting at age 69.  Mammogram. This may be done every 1-2 years. Talk to your health care provider about how often you should have regular mammograms. Talk with your health care provider about your test results, treatment options, and if necessary, the need for more tests. Vaccines  Your health care provider may recommend certain vaccines, such as:  Influenza vaccine. This is recommended every year.  Tetanus, diphtheria, and acellular pertussis (Tdap, Td) vaccine. You may need a  Td booster every 10 years.  Zoster vaccine. You may need this after age 75.  Pneumococcal 13-valent conjugate (PCV13) vaccine. One dose is recommended after age 65.  Pneumococcal polysaccharide (PPSV23) vaccine. One  dose is recommended after age 47. Talk to your health care provider about which screenings and vaccines you need and how often you need them. This information is not intended to replace advice given to you by your health care provider. Make sure you discuss any questions you have with your health care provider. Document Released: 08/29/2015 Document Revised: 04/21/2016 Document Reviewed: 06/03/2015 Elsevier Interactive Patient Education  2017 [place] Prevention in the Home Falls can cause injuries. They can happen to people of all ages. There are many things you can do to make your home safe and to help prevent falls. What can I do on the outside of my home?  Regularly fix the edges of walkways and driveways and fix any cracks.  Remove anything that might make you trip as you walk through a door, such as a raised step or threshold.  Trim any bushes or trees on the path to your home.  Use bright outdoor lighting.  Clear any walking paths of anything that might make someone trip, such as rocks or tools.  Regularly check to see if handrails are loose or broken. Make sure that both sides of any steps have handrails.  Any raised decks and porches should have guardrails on the edges.  Have any leaves, snow, or ice cleared regularly.  Use sand or salt on walking paths during winter.  Clean up any spills in your garage right away. This includes oil or grease spills. What can I do in the bathroom?  Use night lights.  Install grab bars by the toilet and in the tub and shower. Do not use towel bars as grab bars.  Use non-skid mats or decals in the tub or shower.  If you need to sit down in the shower, use a plastic, non-slip stool.  Keep the floor dry. Clean up any water that spills on the floor as soon as it happens.  Remove soap buildup in the tub or shower regularly.  Attach bath mats securely with double-sided non-slip rug tape.  Do not have throw rugs and other  things on the floor that can make you trip. What can I do in the bedroom?  Use night lights.  Make sure that you have a light by your bed that is easy to reach.  Do not use any sheets or blankets that are too big for your bed. They should not hang down onto the floor.  Have a firm chair that has side arms. You can use this for support while you get dressed.  Do not have throw rugs and other things on the floor that can make you trip. What can I do in the kitchen?  Clean up any spills right away.  Avoid walking on wet floors.  Keep items that you use a lot in easy-to-reach places.  If you need to reach something above you, use a strong step stool that has a grab bar.  Keep electrical cords out of the way.  Do not use floor polish or wax that makes floors slippery. If you must use wax, use non-skid floor wax.  Do not have throw rugs and other things on the floor that can make you trip. What can I do with my stairs?  Do not leave any items on the stairs.  Make sure that there are handrails on both sides of the stairs and use them. Fix handrails that are broken or loose. Make sure that handrails are as long as the stairways.  Check any carpeting to make sure that it is firmly attached to the stairs. Fix any carpet that is loose or worn.  Avoid having throw rugs at the top or bottom of the stairs. If you do have throw rugs, attach them to the floor with carpet tape.  Make sure that you have a light switch at the top of the stairs and the bottom of the stairs. If you do not have them, ask someone to add them for you. What else can I do to help prevent falls?  Wear shoes that:  Do not have high heels.  Have rubber bottoms.  Are comfortable and fit you well.  Are closed at the toe. Do not wear sandals.  If you use a stepladder:  Make sure that it is fully opened. Do not climb a closed stepladder.  Make sure that both sides of the stepladder are locked into place.  Ask  someone to hold it for you, if possible.  Clearly mark and make sure that you can see:  Any grab bars or handrails.  First and last steps.  Where the edge of each step is.  Use tools that help you move around (mobility aids) if they are needed. These include:  Canes.  Walkers.  Scooters.  Crutches.  Turn on the lights when you go into a dark area. Replace any light bulbs as soon as they burn out.  Set up your furniture so you have a clear path. Avoid moving your furniture around.  If any of your floors are uneven, fix them.  If there are any pets around you, be aware of where they are.  Review your medicines with your doctor. Some medicines can make you feel dizzy. This can increase your chance of falling. Ask your doctor what other things that you can do to help prevent falls. This information is not intended to replace advice given to you by your health care provider. Make sure you discuss any questions you have with your health care provider. Document Released: 05/29/2009 Document Revised: 01/08/2016 Document Reviewed: 09/06/2014 Elsevier Interactive Patient Education  2017 [employer].

## 2021-01-14 NOTE — Progress Notes (Signed)
Subjective:   Krystal Monroe is a 70 y.o. female who presents for an Initial Medicare Annual Wellness Visit.  I connected with Fendi Meinhardt  today by telephone and verified that I am speaking with the correct person using two identifiers. Location patient: home Location provider: work Persons participating in the virtual visit: patient, provider.   I discussed the limitations, risks, security and privacy concerns of performing an evaluation and management service by telephone and the availability of in person appointments. I also discussed with the patient that there may be a patient responsible charge related to this service. The patient expressed understanding and verbally consented to this telephonic visit.    Interactive audio and video telecommunications were attempted between this provider and patient, however failed, due to patient having technical difficulties OR patient did not have access to video capability.  We continued and completed visit with audio only.     Review of Systems    n/a       Objective:    There were no vitals filed for this visit. There is no height or weight on file to calculate BMI.  Advanced Directives 10/19/2018 01/17/2017 12/28/2016 05/17/2016 03/15/2016  Does Patient Have a Medical Advance Directive? Yes No No No No  Type of Paramedic of [place];Living will - - - -  Does patient want to make changes to medical advance directive? No - Patient declined - - - -  Copy of [place] in Chart? No - copy requested - - - -  Would patient like information on creating a medical advance directive? - No - Patient declined - - -    Current Medications (verified) Outpatient Encounter Medications as of 01/14/2021  Medication Sig  . cholecalciferol (VITAMIN D3) 25 MCG (1000 UNIT) tablet Take 1,000 Units by mouth 2 (two) times daily.  Marland Kitchen escitalopram (LEXAPRO) 10 MG tablet TAKE ONE TABLET BY MOUTH ONCE DAILY  . omeprazole  (PRILOSEC) 20 MG capsule Take 1 capsule (20 mg total) by mouth daily.  Marland Kitchen triamterene-hydrochlorothiazide (DYAZIDE) 37.5-25 MG capsule Take 1 each (1 capsule total) by mouth daily.   No facility-administered encounter medications on file as of 01/14/2021.    Allergies (verified) Iodine-131 and Other   History: Past Medical History:  Diagnosis Date  . Anxiety    son killed in MVA  . Asthma    as a child  . Basal cell carcinoma of nose   . Colon polyps    2011  . Depression    son killed in [place]  . Diverticulosis of colon    diverticulitits  . Gallstones   . GERD (gastroesophageal reflux disease)   . Hypertension   . Obesity   . Sleep apnea    mild per pt-no tx  . Trigeminal neuralgia    Past Surgical History:  Procedure Laterality Date  . ABDOMINAL HYSTERECTOMY     1997  . CATARACT EXTRACTION Left 06/05/2020   per patient  . [place]   x 3  . CHOLECYSTECTOMY  2002  . COLONOSCOPY  last 03/15/2016  . EYE SURGERY    . ROTATOR CUFF REPAIR Right 2017  . TOTAL ABDOMINAL HYSTERECTOMY W/ BILATERAL SALPINGOOPHORECTOMY  2001  . UPPER GASTROINTESTINAL ENDOSCOPY     Family History  Problem Relation Age of Onset  . Diabetes Maternal Grandmother   . Crohn's disease Father   . Lung cancer Father   . Breast cancer Mother   .  Osteoporosis Mother   . Dementia Mother   . Diabetes Mother   . Hypertension Mother   . Colon cancer Neg Hx   . Esophageal cancer Neg Hx   . Rectal cancer Neg Hx   . Stomach cancer Neg Hx   . Colon polyps Neg Hx    Social History   Socioeconomic History  . Marital status: Married    Spouse name: Not on file  . Number of children: 3  . Years of education: BSN  . Highest education level: Bachelor's degree (e.g., BA, AB, BS)  Occupational History  . 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 Smoking status: Never Smoker  . Smokeless tobacco: Never Used  Vaping Use  .  Vaping Use: Never used  Substance and Sexual Activity  . Alcohol use: No    Alcohol/week: 0.0 standard drinks  . Drug use: No  . Sexual activity: Not on file  Other Topics Concern  . Not on file  Social History Narrative   Lives with husband    caffeine 2 a day      10/19/2018:   Lives with husband and 2 golden retrievers on one level   Has three children, one daughter, one son, and one deceased son s/p MVA.   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   Enjoys reading, needlework, painting            Social Determinants of Health   Financial Resource Strain: Low Risk   . Difficulty of Paying Living Expenses: Not hard at all  Food Insecurity: Not on file  Transportation Needs: No Transportation Needs  . Lack of Transportation (Medical): No  . Lack of Transportation (Non-Medical): No  Physical Activity: Not on file  Stress: Not on file  Social Connections: Not on file    Tobacco Counseling Counseling given: Not Answered   Clinical Intake:                 Diabetic?no         Activities of Daily Living No flowsheet data found.  Patient Care Team: [name], NP as PCP - General (Family Medicine) Dohmeier, Asencion Partridge, MD as Consulting Physician (Neurology) [name], MD as Consulting Physician (Ophthalmology) Viona Gilmore, [facility] as Pharmacist (Pharmacist)  Indicate any recent Medical Services you may have received from other than [facility] providers in the past year (date may be approximate).     Assessment:   This is a routine wellness examination for Krystal Monroe.  Hearing/Vision screen No exam data present  Dietary issues and exercise activities discussed:    Goals Addressed   None    Depression Screen PHQ 2/9 Scores 10/19/2018 04/18/2018 01/17/2017 11/08/2016 10/01/2015  PHQ - 2 Score 0 0 0 0 0  PHQ- 9 Score 0 - - - -    Fall Risk Fall Risk  07/01/2020 10/16/2019 10/19/2018  06/15/2018 04/18/2018  Falls in the past year? 0 0 0 Yes No  Number falls in past yr: 0 - - 1 -  Injury with Fall? 0 - - No -  Risk for fall due to : - - - Other (Comment) -  Risk for fall due to: Comment - - - walking her dog -    FALL RISK PREVENTION PERTAINING TO THE HOME:  Any stairs in or around the home? Yes  If so, are there any  without handrails? No  Home free of loose throw rugs in walkways, pet beds, electrical cords, etc? Yes  Adequate lighting in your home to reduce risk of falls? Yes   ASSISTIVE DEVICES UTILIZED TO PREVENT FALLS:  Life alert? No  Use of a cane, walker or w/c? Yes  Grab bars in the bathroom? Yes  Shower chair or bench in shower? Yes  Elevated toilet seat or a handicapped toilet? Yes    Cognitive Function: Normal cognitive status assessed by direct observation by this Nurse Health Advisor. No abnormalities found.   MMSE - Mini Mental State Exam 12/28/2016  Not completed: (No Data)         Immunizations Immunization History  Administered Date(s) Administered  . Fluad Quad(high Dose 65+) 06/13/2019, 07/01/2020  . Influenza Split 05/22/2012, 05/16/2013  . Influenza Whole 08/16/2006  . Influenza, High Dose Seasonal PF 04/24/2018  . Influenza-Unspecified 04/26/2014, 05/09/2015, 03/22/2016  . PFIZER(Purple Top)SARS-COV-2 Vaccination 09/30/2019, 10/23/2019  . Pneumococcal Conjugate-13 10/01/2015  . Pneumococcal Polysaccharide-23 12/28/2016  . Td 08/16/1998, 10/11/2008  . Zoster, Live 02/25/2011    TDAP status: Due, Education has been provided regarding the importance of this vaccine. Advised may receive this vaccine at local pharmacy or Health Dept. Aware to provide a copy of the vaccination record if obtained from local pharmacy or Health Dept. Verbalized acceptance and understanding.  Flu Vaccine status: Up to date  Pneumococcal vaccine status: Up to date  Covid-19 vaccine status: Completed vaccines  Qualifies for Shingles Vaccine? Yes    Zostavax completed No   Shingrix Completed?: No.    Education has been provided regarding the importance of this vaccine. Patient has been advised to call insurance company to determine out of pocket expense if they have not yet received this vaccine. Advised may also receive vaccine at local pharmacy or Health Dept. Verbalized acceptance and understanding.  Screening Tests Health Maintenance  Topic Date Due  . Hepatitis C Screening  Never done  . Zoster Vaccines- Shingrix (1 of 2) Never done  . COVID-19 Vaccine (3 - Booster for Pfizer series) 03/24/2020  . INFLUENZA VACCINE  03/16/2021  . MAMMOGRAM  09/25/2021  . COLONOSCOPY (Pts 45-40yrs Insurance coverage will need to be confirmed)  05/22/2022  . DEXA SCAN  Completed  . PNA vac Low Risk Adult  Completed  . HPV VACCINES  Aged Out    Health Maintenance  Health Maintenance Due  Topic Date Due  . Hepatitis C Screening  Never done  . Zoster Vaccines- Shingrix (1 of 2) Never done  . COVID-19 Vaccine (3 - Booster for Pfizer series) 03/24/2020    Colorectal cancer screening: Type of screening: Colonoscopy. Completed 05/23/2019. Repeat every 10 years  Mammogram status: Completed 01/15/2021. Repeat every year  Bone Density status: Completed 11/15/2016. Results reflect: Bone density results: OSTEOPENIA. Repeat every 5 years.  Lung Cancer Screening: (Low Dose CT Chest recommended if Age 59-80 years, 30 pack-year currently smoking OR have quit w/in 15years.) does not qualify.   Lung Cancer Screening Referral: n/a  Additional Screening:  Hepatitis C Screening: does not qualify;   Vision Screening: Recommended annual ophthalmology exams for early detection of glaucoma and other disorders of the eye. Is the patient up to date with their annual eye exam?  Yes  Who is the provider or what is the name of the office in which the patient attends annual eye exams? Dr.Bowen  If pt is not established with a provider, would they like to be  referred to a  provider to establish care? No .   Dental Screening: Recommended annual dental exams for proper oral hygiene  Community Resource Referral / Chronic Care Management: CRR required this visit?  No   CCM required this visit?  No      Plan:     I have personally reviewed and noted the following in the patient's chart:   . Medical and social history . Use of alcohol, tobacco or illicit drugs  . Current medications and supplements including opioid prescriptions. Patient is not currently taking opioid prescriptions. . Functional ability and status . Nutritional status . Physical activity . Advanced directives . List of other physicians . Hospitalizations, surgeries, and ER visits in previous 12 months . Vitals . Screenings to include cognitive, depression, and falls . Referrals and appointments  In addition, I have reviewed and discussed with patient certain preventive protocols, quality metrics, and best practice recommendations. A written personalized care plan for preventive services as well as general preventive health recommendations were provided to patient.     Randel Pigg, LPN   10/22/1838   Nurse Notes: none

## 2021-01-15 ENCOUNTER — Telehealth: Payer: Self-pay | Admitting: Pharmacist

## 2021-01-15 DIAGNOSIS — Z1231 Encounter for screening mammogram for malignant neoplasm of breast: Secondary | ICD-10-CM | POA: Diagnosis not present

## 2021-01-15 LAB — HM MAMMOGRAPHY

## 2021-01-15 NOTE — Chronic Care Management (AMB) (Signed)
    Chronic Care Management Pharmacy Assistant   Name: Krystal Monroe  MRN: [id number] DOB: 1951-03-26  Reason for Encounter: Medication Review-Medication Coordination Call  Recent office visits:  06.01.2022 Randel Pigg LPN- Medicare Wellness Exam  Recent consult visits:  None  Hospital visits:  None in previous 6 months  Medications: Outpatient Encounter Medications as of 01/15/2021  Medication Sig  . cholecalciferol (VITAMIN D3) 25 MCG (1000 UNIT) tablet Take 1,000 Units by mouth 2 (two) times daily.  Marland Kitchen escitalopram (LEXAPRO) 10 MG tablet TAKE ONE TABLET BY MOUTH ONCE DAILY  . omeprazole (PRILOSEC) 20 MG capsule Take 1 capsule (20 mg total) by mouth daily.  Marland Kitchen triamterene-hydrochlorothiazide (DYAZIDE) 37.5-25 MG capsule Take 1 each (1 capsule total) by mouth daily.   No facility-administered encounter medications on file as of 01/15/2021.     Reviewed chart for medication changes ahead of medication coordination call.  BP Readings from Last 3 Encounters:  10/01/20 133/78  07/01/20 120/70  05/07/20 120/72    Lab Results  Component Value Date   HGBA1C 6.2 (H) 07/04/2020     Patient obtains medications through Vials  90 Days  Last adherence delivery included:  Marland Kitchen Triamterene-hydrochlorothiazide (DYAZIDE) 37.5-25 mg capsule daily  . Omeprazole (PRILOSEC) 20 mg capsule daily . Escitalopram (LEXAPRO) 10 mg tablet daily .   Patient is due for next adherence delivery on: 01/20/2021. Called patient and reviewed medications and coordinated delivery. This delivery to include: . Triamterene-hydrochlorothiazide (DYAZIDE) 37.5-25 mg capsule daily  . Omeprazole (PRILOSEC) 20 mg capsule daily . Escitalopram (LEXAPRO) 10 mg tablet daily  She currently does not need refills  Confirmed delivery date of 01/21/2021, advised patient that pharmacy will contact them the morning of delivery.  Star Rating Drugs: None  Amilia Revonda Standard, [place] Pharmacist  Assistant [phone]

## 2021-01-19 ENCOUNTER — Encounter: Payer: Self-pay | Admitting: Adult Health

## 2021-02-12 ENCOUNTER — Other Ambulatory Visit: Payer: Self-pay | Admitting: Adult Health

## 2021-02-12 DIAGNOSIS — F419 Anxiety disorder, unspecified: Secondary | ICD-10-CM

## 2021-02-12 DIAGNOSIS — K2100 Gastro-esophageal reflux disease with esophagitis, without bleeding: Secondary | ICD-10-CM

## 2021-02-12 DIAGNOSIS — F32A Depression, unspecified: Secondary | ICD-10-CM

## 2021-02-17 ENCOUNTER — Telehealth: Payer: Self-pay | Admitting: Pharmacist

## 2021-02-17 NOTE — Chronic Care Management (AMB) (Signed)
    Chronic Care Management Pharmacy Assistant   Name: Krystal Monroe  MRN: [id number] DOB: [birth year]  Reason for Encounter: Medication Review-Medication Coordination Call   Recent office visits:  None  Recent consult visits:  None  Hospital visits:  None in previous 6 months  Medications: Outpatient Encounter Medications as of 02/17/2021  Medication Sig   cholecalciferol (VITAMIN D3) 25 MCG (1000 UNIT) tablet Take 1,000 Units by mouth 2 (two) times daily.   escitalopram (LEXAPRO) 10 MG tablet TAKE ONE TABLET BY MOUTH ONCE DAILY   omeprazole (PRILOSEC) 20 MG capsule TAKE ONE TABLET BY MOUTH EVERY OTHER DAY   triamterene-hydrochlorothiazide (DYAZIDE) 37.5-25 MG capsule Take 1 each (1 capsule total) by mouth daily.   No facility-administered encounter medications on file as of 02/17/2021.    Reviewed chart for medication changes ahead of medication coordination call. No OVs, Consults, or hospital visits since last care coordination call/Pharmacist visit.  No medication changes indicated OR if recent visit, treatment plan here.  BP Readings from Last 3 Encounters:  10/01/20 133/78  07/01/20 120/70  05/07/20 120/72    Lab Results  Component Value Date   HGBA1C 6.2 (H) 07/04/2020    Patient obtains medications through Vials  90 Days  Last adherence delivery included:           Triamterene-hydrochlorothiazide (DYAZIDE) 37.5-25 mg capsule daily          Omeprazole (PRILOSEC) 20 mg capsule daily          Escitalopram (LEXAPRO) 10 mg tablet daily  Patient is due for next adherence delivery on: 07.11.2022. Called patient and reviewed medications and coordinated delivery.  This delivery to include:          Triamterene-hydrochlorothiazide (DYAZIDE) 37.5-25 mg capsule daily          Omeprazole (PRILOSEC) 20 mg capsule daily          Escitalopram (LEXAPRO) 10 mg tablet daily   Patient  does not needs refills for her medication. Confirmed delivery date of 07.11.2011, advised  patient that pharmacy will contact them the morning of delivery.   Star Rating Drugs: None  Krystal Monroe, [place] Pharmacist Assistant [phone]

## 2021-03-16 ENCOUNTER — Telehealth: Payer: Self-pay | Admitting: Pharmacist

## 2021-03-16 NOTE — Chronic Care Management (AMB) (Addendum)
Chronic Care Management Pharmacy Assistant   Name: GENISHA LAZUR  MRN: [id number] DOB: 04-08-1951  Reason for Encounter: Medication Review Medication Assessment Call    Recent office visits:  None  Recent consult visits:  None  Hospital visits:  Medication Reconciliation was completed by comparing discharge summary, patient's EMR and Pharmacy list, and upon discussion with patient.   Admitted to the hospital on 03.02.2022 due to Right suboccipital craniotomy for microvascular decompression. Discharge date was 03.04.2022. Discharged from [facility].     New?Medications Started at [facility] Discharge:?? -started  Acetaminophen (TYLENOL) 325 MG tablet Take 2 tablets (650 mg total) by mouth every 6 (six) hours as needed for Mild pain (1-3) or Fever (PRN Fever >100.4). Diphenhydramine (BENADRYL) 25 mg capsule Take 1 capsule (25 mg total) by mouth every 6 (six) hours as needed for up to 10 days for Itching or Allergies. Hydrocodone-acetaminophen (NORCO) 5-325 mg per tablet Take 1-2 tablets by mouth every 6 (six) hours as needed for up to 7 days (1 for moderate; 2 for severe POST OP pain). Qty: 30 tablets Polyethylene glycol (MIRALAX) 17-gram packet Take 17 g by mouth daily as needed. Senna-docusate (PERICOLACE, SENOKOT-S) 8.6-50 mg per tablet Take 2 tablets by mouth at bedtime. While on narcotic pain medication    Medication Changes at Hospital Discharge: Lamotrigine (LAMICTAL) 25 MG tablet 3/4-3/10: 2 tabs in AM, 3 tabs in PM. 3/11-3/17: 2 tabs 2x/day. 3/18-3/24: 1 tab in AM, 2 in PM. 3/25-3/31: 1 tab 2x/day. 4/1-4/7: 1 tab nightly. 4/8: stop.    Medications that remain the same after Hospital Discharge:?? -All other medications will remain the same   Medications: Outpatient Encounter Medications as of 03/16/2021  Medication Sig   cholecalciferol (VITAMIN D3) 25 MCG (1000 UNIT) tablet Take 1,000 Units by mouth 2 (two) times daily.   escitalopram (LEXAPRO)  10 MG tablet TAKE ONE TABLET BY MOUTH ONCE DAILY   omeprazole (PRILOSEC) 20 MG capsule TAKE ONE TABLET BY MOUTH EVERY OTHER DAY   triamterene-hydrochlorothiazide (DYAZIDE) 37.5-25 MG capsule Take 1 each (1 capsule total) by mouth daily.   No facility-administered encounter medications on file as of 03/16/2021.  Reviewed chart for medication changes ahead of medication coordination call.  BP Readings from Last 3 Encounters:  10/01/20 133/78  07/01/20 120/70  05/07/20 120/72    Lab Results  Component Value Date   HGBA1C 6.2 (H) 07/04/2020     Patient obtains medications through Vials  90 Days   Last adherence delivery included:  Triamterene-hydrochlorothiazide (DYAZIDE) 37.5-25 mg capsule daily          Omeprazole (PRILOSEC) 20 mg capsule daily          Escitalopram (LEXAPRO) 10 mg tablet daily   Patient did not decline any medications last month.  Patient is due for next adherence delivery on: 03-26-2021. Called patient and reviewed medications and coordinated delivery.  This delivery to include:  Triamterene-hydrochlorothiazide (DYAZIDE) 37.5-25 mg capsule daily          Omeprazole (PRILOSEC) 20 mg capsule daily          Escitalopram (LEXAPRO) 10 mg tablet daily  Confirmed delivery date of 03-26-2021, advised patient that pharmacy will contact them the morning of delivery.   Reviewed chart prior to disease state call. Spoke with patient regarding BP  Recent Office Vitals: BP Readings from Last 3 Encounters:  10/01/20 133/78  07/01/20 120/70  05/07/20 120/72   Pulse Readings from Last 3 Encounters:  10/01/20  69  07/01/20 67  05/07/20 75    Wt Readings from Last 3 Encounters:  10/01/20 169 lb (76.7 kg)  07/01/20 174 lb 6.4 oz (79.1 kg)  05/07/20 174 lb 9.6 oz (79.2 kg)     Kidney Function Lab Results  Component Value Date/Time   CREATININE 0.83 07/04/2020 09:10 AM   CREATININE 0.77 06/14/2019 08:47 AM   CREATININE 0.59 06/15/2018 03:20 PM   GFR 74.14 06/14/2019  08:47 AM   GFRNONAA 71 07/04/2020 09:10 AM   GFRAA 83 07/04/2020 09:10 AM    BMP Latest Ref Rng & Units 07/04/2020 06/14/2019 06/15/2018  Glucose 65 - 99 mg/dL 107(H) 113(H) 99  BUN 7 - 25 mg/dL '17 14 10  '$ Creatinine 0.60 - 0.93 mg/dL 0.83 0.77 0.59  BUN/Creat Ratio 6 - 22 (calc) NOT APPLICABLE - 17  Sodium 135 - 146 mmol/L 140 139 125(L)  Potassium 3.5 - 5.3 mmol/L 4.6 3.8 4.3  Chloride 98 - 110 mmol/L 102 101 84(L)  CO2 20 - 32 mmol/L '29 29 21  '$ Calcium 8.6 - 10.4 mg/dL 9.9 9.5 10.1   Notes: Per CPP also checked with patient regarding her blood pressures Current antihypertensive regimen:  DYAZIDE 37.5-25 MG capsule once daily How often are you checking your Blood Pressure? weekly Current home BP readings: Patient reports her blood pressures are the best thing she has going for her advised she runs in the 120's always. Does not keep a log  but dies check it weekly.   Adherence Review: Is the patient currently on ACE/ARB medication? No Does the patient have >5 day gap between last estimated fill dates? No   Care Gaps:  AWV - Done 01-14-21 Hepatitis C Screening - Overdue Zoster Vaccine - Overdue COVID Booster #3 Therapist, music ) - Overdue Flu Vaccine - Overdue  Star Rating Drugs: None  Ned Clines [place] Clinical Pharmacist Assistant [phone]

## 2021-05-13 ENCOUNTER — Ambulatory Visit: Payer: Medicare Other | Admitting: Family Medicine

## 2021-06-11 ENCOUNTER — Other Ambulatory Visit: Payer: Self-pay | Admitting: Adult Health

## 2021-06-11 DIAGNOSIS — I10 Essential (primary) hypertension: Secondary | ICD-10-CM

## 2021-06-12 ENCOUNTER — Telehealth: Payer: Self-pay | Admitting: Pharmacist

## 2021-06-12 NOTE — Chronic Care Management (AMB) (Signed)
Chronic Care Management Pharmacy Assistant   Name: HARMONI LUCUS  MRN: [id number] DOB: [birth year]    Reason for Encounter: Medication Review Medication Coordination  Recent office visits:  None  Recent consult visits:  None   Hospital visits:  None in previous 6 months  Medications: Outpatient Encounter Medications as of 06/12/2021  Medication Sig   cholecalciferol (VITAMIN D3) 25 MCG (1000 UNIT) tablet Take 1,000 Units by mouth 2 (two) times daily.   escitalopram (LEXAPRO) 10 MG tablet TAKE ONE TABLET BY MOUTH ONCE DAILY   omeprazole (PRILOSEC) 20 MG capsule TAKE ONE TABLET BY MOUTH EVERY OTHER DAY   triamterene-hydrochlorothiazide (DYAZIDE) 37.5-25 MG capsule TAKE ONE CAPSULE BY MOUTH ONCE DAILY   No facility-administered encounter medications on file as of 06/12/2021.  Reviewed chart for medication changes ahead of medication coordination call.  No OVs, Consults, or hospital visits since last care coordination call/Pharmacist visit.   No medication changes indicated  BP Readings from Last 3 Encounters:  10/01/20 133/78  07/01/20 120/70  05/07/20 120/72    Lab Results  Component Value Date   HGBA1C 6.2 (H) 07/04/2020     Patient obtains medications through Vials  90 Days   Last adherence delivery included:   Triamterene-hydrochlorothiazide (DYAZIDE) 37.5-25 mg capsule daily          Omeprazole (PRILOSEC) 20 mg capsule daily          Escitalopram (LEXAPRO) 10 mg tablet daily   Patient is due for next adherence delivery on: 06/24/21. Called patient and reviewed medications and coordinated delivery. Confirmed vials for  90 DS  This delivery to include:  Omeprazole (PRILOSEC) : 20 mg : Take one tab by mouth every other day Escitalopram (LEXAPRO) 10 mg : Take one tab a day Triamterene-hydrochlorothiazide (DYAZIDE) 375.-25 mg :Take one tab a day  Confirmed delivery date of 06/24/21, advised patient that pharmacy will contact them the morning of delivery.   Reviewed chart prior to disease state call. Spoke with patient regarding BP  Recent Office Vitals: BP Readings from Last 3 Encounters:  10/01/20 133/78  07/01/20 120/70  05/07/20 120/72   Pulse Readings from Last 3 Encounters:  10/01/20 69  07/01/20 67  05/07/20 75    Wt Readings from Last 3 Encounters:  10/01/20 169 lb (76.7 kg)  07/01/20 174 lb 6.4 oz (79.1 kg)  05/07/20 174 lb 9.6 oz (79.2 kg)     Kidney Function Lab Results  Component Value Date/Time   CREATININE 0.83 07/04/2020 09:10 AM   CREATININE 0.77 06/14/2019 08:47 AM   CREATININE 0.59 06/15/2018 03:20 PM   GFR 74.14 06/14/2019 08:47 AM   GFRNONAA 71 07/04/2020 09:10 AM   GFRAA 83 07/04/2020 09:10 AM    BMP Latest Ref Rng & Units 07/04/2020 06/14/2019 06/15/2018  Glucose 65 - 99 mg/dL 107(H) 113(H) 99  BUN 7 - 25 mg/dL 17 14 10   Creatinine 0.60 - 0.93 mg/dL 0.83 0.77 0.59  BUN/Creat Ratio 6 - 22 (calc) NOT APPLICABLE - 17  Sodium 135 - 146 mmol/L 140 139 125(L)  Potassium 3.5 - 5.3 mmol/L 4.6 3.8 4.3  Chloride 98 - 110 mmol/L 102 101 84(L)  CO2 20 - 32 mmol/L 29 29 21   Calcium 8.6 - 10.4 mg/dL 9.9 9.5 10.1    Current antihypertensive regimen:  Triamterene-hydrochlorothiazide (DYAZIDE) 375.-25 mg :Take one tab a day How often are you checking your Blood Pressure? Weekly patient does not log Current home BP readings: 133/78 (office) What recent interventions/DTPs  have been made by any provider to improve Blood Pressure control since last CPP Visit: Patient reports none Any recent hospitalizations or ED visits since last visit with CPP? No  Adherence Review: Is the patient currently on ACE/ARB medication? No Does the patient have >5 day gap between last estimated fill dates? No  Care Gaps: AWV - Done 6/22 Hepatitis C Screening - Overdue Zoster Vaccine - Overdue COVID Booster #3 Therapist, music ) - Overdue Flu Vaccine - Overdue CCM- Not scheduled/ declined for now BP-133/78   Star Rating  Drugs: None  Ned Clines CMA Clinical Pharmacist Assistant [phone]

## 2021-07-03 ENCOUNTER — Ambulatory Visit (INDEPENDENT_AMBULATORY_CARE_PROVIDER_SITE_OTHER): Payer: PPO | Admitting: Adult Health

## 2021-07-03 ENCOUNTER — Encounter: Payer: Self-pay | Admitting: Adult Health

## 2021-07-03 VITALS — BP 126/60 | HR 67 | Temp 98.7°F | Ht 64.0 in | Wt 169.0 lb

## 2021-07-03 DIAGNOSIS — I10 Essential (primary) hypertension: Secondary | ICD-10-CM

## 2021-07-03 DIAGNOSIS — F32A Depression, unspecified: Secondary | ICD-10-CM | POA: Diagnosis not present

## 2021-07-03 DIAGNOSIS — K2100 Gastro-esophageal reflux disease with esophagitis, without bleeding: Secondary | ICD-10-CM

## 2021-07-03 DIAGNOSIS — K219 Gastro-esophageal reflux disease without esophagitis: Secondary | ICD-10-CM

## 2021-07-03 DIAGNOSIS — G500 Trigeminal neuralgia: Secondary | ICD-10-CM

## 2021-07-03 DIAGNOSIS — Z1159 Encounter for screening for other viral diseases: Secondary | ICD-10-CM

## 2021-07-03 DIAGNOSIS — R7302 Impaired glucose tolerance (oral): Secondary | ICD-10-CM

## 2021-07-03 DIAGNOSIS — E559 Vitamin D deficiency, unspecified: Secondary | ICD-10-CM

## 2021-07-03 DIAGNOSIS — Z23 Encounter for immunization: Secondary | ICD-10-CM | POA: Diagnosis not present

## 2021-07-03 DIAGNOSIS — E7439 Other disorders of intestinal carbohydrate absorption: Secondary | ICD-10-CM | POA: Diagnosis not present

## 2021-07-03 LAB — CBC WITH DIFFERENTIAL/PLATELET
Basophils Absolute: 0 10*3/uL (ref 0.0–0.1)
Basophils Relative: 0.5 % (ref 0.0–3.0)
Eosinophils Absolute: 0.1 10*3/uL (ref 0.0–0.7)
Eosinophils Relative: 1.6 % (ref 0.0–5.0)
HCT: 40.5 % (ref 36.0–46.0)
Hemoglobin: 13.5 g/dL (ref 12.0–15.0)
Lymphocytes Relative: 19.5 % (ref 12.0–46.0)
Lymphs Abs: 1.2 10*3/uL (ref 0.7–4.0)
MCHC: 33.3 g/dL (ref 30.0–36.0)
MCV: 84.9 fl (ref 78.0–100.0)
Monocytes Absolute: 0.3 10*3/uL (ref 0.1–1.0)
Monocytes Relative: 5.7 % (ref 3.0–12.0)
Neutro Abs: 4.4 10*3/uL (ref 1.4–7.7)
Neutrophils Relative %: 72.7 % (ref 43.0–77.0)
Platelets: 194 10*3/uL (ref 150.0–400.0)
RBC: 4.77 Mil/uL (ref 3.87–5.11)
RDW: 13.2 % (ref 11.5–15.5)
WBC: 6.1 10*3/uL (ref 4.0–10.5)

## 2021-07-03 LAB — COMPREHENSIVE METABOLIC PANEL
ALT: 13 U/L (ref 0–35)
AST: 15 U/L (ref 0–37)
Albumin: 4.7 g/dL (ref 3.5–5.2)
Alkaline Phosphatase: 52 U/L (ref 39–117)
BUN: 15 mg/dL (ref 6–23)
CO2: 28 mEq/L (ref 19–32)
Calcium: 9.8 mg/dL (ref 8.4–10.5)
Chloride: 101 mEq/L (ref 96–112)
Creatinine, Ser: 0.75 mg/dL (ref 0.40–1.20)
GFR: 79.82 mL/min (ref >60.00)
Glucose, Bld: 111 mg/dL — ABNORMAL HIGH (ref 70–99)
Potassium: 3.8 mEq/L (ref 3.5–5.1)
Sodium: 138 mEq/L (ref 135–145)
Total Bilirubin: 0.7 mg/dL (ref 0.2–1.2)
Total Protein: 7.4 g/dL (ref 6.0–8.3)

## 2021-07-03 LAB — LIPID PANEL
Cholesterol: 187 mg/dL (ref 0–200)
HDL: 42.8 mg/dL (ref >39.00)
NonHDL: 144.46
Total CHOL/HDL Ratio: 4
Triglycerides: 238 mg/dL — ABNORMAL HIGH (ref 0.0–149.0)
VLDL: 47.6 mg/dL — ABNORMAL HIGH (ref 0.0–40.0)

## 2021-07-03 LAB — LDL CHOLESTEROL, DIRECT: Direct LDL: 103 mg/dL

## 2021-07-03 LAB — TSH: TSH: 1.72 u[IU]/mL (ref 0.35–5.50)

## 2021-07-03 LAB — VITAMIN D 25 HYDROXY (VIT D DEFICIENCY, FRACTURES): VITD: 22.5 ng/mL — ABNORMAL LOW (ref 30.00–100.00)

## 2021-07-03 LAB — HEMOGLOBIN A1C: Hgb A1c MFr Bld: 6.1 % (ref 4.6–6.5)

## 2021-07-03 NOTE — Progress Notes (Signed)
Subjective:    Patient ID: Krystal Monroe, female    DOB: [birth year], 70 y.o.   MRN: [id number]  HPI Patient presents for yearly preventative medicine examination. She is a pleasant 70 year old female who  has a past medical history of Anxiety, Asthma, Basal cell carcinoma of nose, Colon polyps, Depression, Diverticulosis of colon, Gallstones, GERD (gastroesophageal reflux disease), Hypertension, Obesity, Sleep apnea, and Trigeminal neuralgia.  Hypertension-prescribed Dyazide 37.5-25 mg daily.  She does monitor her blood pressure at home currently and gets readings in the 814G to 818H systolic.  She denies dizziness, chest pain, shortness of breath  BP Readings from Last 3 Encounters:  07/03/21 126/60  10/01/20 133/78  07/01/20 120/70   GERD-Prilosec 20 mg daily.  Her symptoms are well controlled.    Depression-controlled with Lexapro 10 mg daily.  She has no depressive symptoms.  Trigeminal neuralgia- she had her microvascular decompression and is feeling well. Has come off Lamictal .   Vitamin D Deficiency - takes 1000 units daily   Glucose Intolerance - not currently on medication. Has been working on lifestyle modifications Lab Results  Component Value Date   HGBA1C 6.2 (H) 07/04/2020     All immunizations and health maintenance protocols were reviewed with the patient and needed orders were placed.  Appropriate screening laboratory values were ordered for the patient including screening of hyperlipidemia, renal function and hepatic function.  Medication reconciliation,  past medical history, social history, problem list and allergies were reviewed in detail with the patient  Goals were established with regard to weight loss, exercise, and  diet in compliance with medications. She is staying active and is eating healthy.   Wt Readings from Last 3 Encounters:  07/03/21 169 lb (76.7 kg)  10/01/20 169 lb (76.7 kg)  07/01/20 174 lb 6.4 oz (79.1 kg)   She is up-to-date on  routine screening for colon cancer, breast cancer, and GYN care.  Has no acute complaints today    Review of Systems  Constitutional: Negative.   HENT: Negative.    Eyes: Negative.   Respiratory: Negative.    Cardiovascular: Negative.   Gastrointestinal: Negative.   Endocrine: Negative.   Genitourinary: Negative.   Musculoskeletal: Negative.   Skin: Negative.   Allergic/Immunologic: Negative.   Neurological: Negative.   Hematological: Negative.   Psychiatric/Behavioral: Negative.     Past Medical History:  Diagnosis Date   Anxiety    son killed in MVA   Asthma    as a child   Basal cell carcinoma of nose    Colon polyps    2011   Depression    son killed in MVA   Diverticulosis of colon    diverticulitits   Gallstones    GERD (gastroesophageal reflux disease)    Hypertension    Obesity    Sleep apnea    mild per pt-no tx   Trigeminal neuralgia     Social History   Socioeconomic History   Marital status: Married    Spouse name: Not on file   Number of children: 3   Years of education: BSN   Highest education level: Bachelor's degree (e.g., BA, AB, BS)  Occupational History   Occupation: Therapist, 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   Enjoys reading, needlework, painting            Social Determinants of Health   Financial Resource  Strain: Not on file  Food Insecurity: Not on file  Transportation Needs: Not on file  Physical Activity: Not on file  Stress: Not on file  Social Connections: Not on file  Intimate Partner Violence: Not on file    Past Surgical History:  Procedure Laterality Date   ABDOMINAL HYSTERECTOMY     1997   CATARACT EXTRACTION Left 06/05/2020   per patient   [place], 1982, 1984   x 3   CHOLECYSTECTOMY  2002   COLONOSCOPY  last 03/15/2016   EYE SURGERY     ROTATOR CUFF REPAIR Right 2017   TOTAL ABDOMINAL HYSTERECTOMY W/ BILATERAL SALPINGOOPHORECTOMY  2001   UPPER GASTROINTESTINAL ENDOSCOPY      Family History  Problem Relation Age of Onset   Diabetes Maternal Grandmother    Crohn's disease Father    Lung cancer Father    Breast cancer Mother    Osteoporosis Mother    Dementia Mother    Diabetes Mother    Hypertension Mother    Colon cancer Neg Hx    Esophageal cancer Neg Hx    Rectal cancer Neg Hx    Stomach cancer Neg Hx    Colon polyps Neg Hx     Allergies  Allergen Reactions   Iodine-131 Rash   Other Hives, Rash and Other (See Comments)    Magnavist Contrast    Current Outpatient Medications on File Prior to Visit  Medication Sig Dispense Refill   cholecalciferol (VITAMIN D3) 25 MCG (1000 UNIT) tablet Take 1,000 Units by mouth 2 (two) times daily.     escitalopram (LEXAPRO) 10 MG tablet TAKE ONE TABLET BY MOUTH ONCE DAILY 90 tablet 1   omeprazole (PRILOSEC) 20 MG capsule TAKE ONE TABLET BY MOUTH EVERY OTHER DAY 90 capsule 1   triamterene-hydrochlorothiazide (DYAZIDE) 37.5-25 MG capsule TAKE ONE CAPSULE BY MOUTH ONCE DAILY 90 capsule 3   No current facility-administered medications on file prior to visit.    BP 126/60   Pulse 67   Temp 98.7 F (37.1 C) (Oral)   Ht 5\' 4"  (1.626 m)   Wt 169 lb (76.7 kg)   BMI 29.01 kg/m        Objective:   Physical Exam Vitals and nursing note reviewed.  Constitutional:      General: She is not in acute  distress.    Appearance: Normal appearance. She is well-developed. She is not ill-appearing.  HENT:     Head: Normocephalic and atraumatic.     Right Ear: Tympanic membrane, ear canal and external ear normal. There is no impacted cerumen.     Left Ear: Tympanic membrane, ear canal and external ear normal. There is no impacted cerumen.     Nose: Nose normal. No congestion or rhinorrhea.  Mouth/Throat:     Mouth: Mucous membranes are moist.     Pharynx: Oropharynx is clear. No oropharyngeal exudate or posterior oropharyngeal erythema.  Eyes:     General:        Right eye: No discharge.        Left eye: No discharge.     Extraocular Movements: Extraocular movements intact.     Conjunctiva/sclera: Conjunctivae normal.     Pupils: Pupils are equal, round, and reactive to light.  Neck:     Thyroid: No thyromegaly.     Vascular: No carotid bruit.     Trachea: No tracheal deviation.  Cardiovascular:     Rate and Rhythm: Normal rate and regular rhythm.     Pulses: Normal pulses.     Heart sounds: Normal heart sounds. No murmur heard.   No friction rub. No gallop.  Pulmonary:     Effort: Pulmonary effort is normal. No respiratory distress.     Breath sounds: Normal breath sounds. No stridor. No wheezing, rhonchi or rales.  Chest:     Chest wall: No tenderness.  Abdominal:     General: Abdomen is flat. Bowel sounds are normal. There is no distension.     Palpations: Abdomen is soft. There is no mass.     Tenderness: There is no abdominal tenderness. There is no right CVA tenderness, left CVA tenderness, guarding or rebound.     Hernia: No hernia is present.  Musculoskeletal:        General: No swelling, tenderness, deformity or signs of injury. Normal range of motion.     Cervical back: Normal range of motion and neck supple.     Right lower leg: No edema.     Left lower leg: No edema.  Lymphadenopathy:     Cervical: No cervical adenopathy.  Skin:    General: Skin is warm and dry.      Coloration: Skin is not jaundiced or pale.     Findings: No bruising, erythema, lesion or rash.  Neurological:     General: No focal deficit present.     Mental Status: She is alert and oriented to person, place, and time.     Cranial Nerves: No cranial nerve deficit.     Sensory: No sensory deficit.     Motor: No weakness.     Coordination: Coordination normal.     Gait: Gait normal.     Deep Tendon Reflexes: Reflexes normal.  Psychiatric:        Mood and Affect: Mood normal.        Behavior: Behavior normal.        Thought Content: Thought content normal.        Judgment: Judgment normal.      Assessment & Plan:  1. Essential hypertension - Well controlled. No change in medications - Follow up in one year or sooner if needed - CBC with Differential/Platelet; Future - Comprehensive metabolic panel; Future - Lipid panel; Future - TSH; Future - VITAMIN D 25 Hydroxy (Vit-D Deficiency, Fractures); Future - VITAMIN D 25 Hydroxy (Vit-D Deficiency, Fractures) - TSH - Lipid panel - Comprehensive metabolic panel - CBC with Differential/Platelet  2. NEURALGIA, TRIGEMINAL - Resolved after surgery.  - CBC with Differential/Platelet; Future - Comprehensive metabolic panel; Future - Lipid panel; Future - TSH; Future - VITAMIN D 25 Hydroxy (Vit-D Deficiency, Fractures); Future - VITAMIN D 25 Hydroxy (Vit-D Deficiency, Fractures) - TSH - Lipid panel - Comprehensive metabolic panel - CBC with Differential/Platelet  3. Gastroesophageal reflux disease with esophagitis without hemorrhage - Continue with Prilosec   4. Depression, unspecified depression type - Continue with Lexapro  5. Vitamin D deficiency  - VITAMIN D 25 Hydroxy (Vit-D Deficiency, Fractures); Future - VITAMIN D 25 Hydroxy (Vit-D Deficiency, Fractures)  6. Need for hepatitis C screening test  - Hep C Antibody; Future - Hep C Antibody  7. Glucose intolerance - Consider metformin  - Hemoglobin A1c;  Future - Hemoglobin A1c  8.  Need for immunization against influenza  - Flu Vaccine QUAD High Dose(Fluad)  [name], NP

## 2021-07-03 NOTE — Patient Instructions (Signed)
It was great seeing you today   We will follow up with you regarding your lab work   I will see you back in one year or sooner if needed

## 2021-07-06 LAB — HEPATITIS C ANTIBODY
Hepatitis C Ab: NONREACTIVE
SIGNAL TO CUT-OFF: 0.03 (ref <1.00)

## 2021-07-13 ENCOUNTER — Encounter: Payer: Self-pay | Admitting: Family Medicine

## 2021-07-13 ENCOUNTER — Telehealth (INDEPENDENT_AMBULATORY_CARE_PROVIDER_SITE_OTHER): Payer: PPO | Admitting: Family Medicine

## 2021-07-13 ENCOUNTER — Telehealth: Payer: Self-pay | Admitting: Adult Health

## 2021-07-13 DIAGNOSIS — J101 Influenza due to other identified influenza virus with other respiratory manifestations: Secondary | ICD-10-CM

## 2021-07-13 DIAGNOSIS — J029 Acute pharyngitis, unspecified: Secondary | ICD-10-CM

## 2021-07-13 DIAGNOSIS — R059 Cough, unspecified: Secondary | ICD-10-CM

## 2021-07-13 LAB — POC COVID19 BINAXNOW: SARS Coronavirus 2 Ag: NEGATIVE

## 2021-07-13 LAB — POCT INFLUENZA A/B
Influenza A, POC: POSITIVE — AB
Influenza B, POC: NEGATIVE

## 2021-07-13 MED ORDER — BENZONATATE 100 MG PO CAPS: 100.0000 mg | ORAL_CAPSULE | Freq: Two times a day (BID) | ORAL | 0 refills | Status: DC | PRN

## 2021-07-13 MED ORDER — OSELTAMIVIR PHOSPHATE 75 MG PO CAPS
75.0000 mg | ORAL_CAPSULE | Freq: Two times a day (BID) | ORAL | 0 refills | Status: AC
Start: 2021-07-13 — End: 2021-07-18

## 2021-07-13 NOTE — Telephone Encounter (Signed)
Pt has sore throat within 5 days. Pt will come in 30 min prior to appt 230 appt time today with dr banks

## 2021-07-13 NOTE — Telephone Encounter (Signed)
Patient calling in with respiratory symptoms: Shortness of breath, chest pain, palpitations or other red words send to Triage  Does the patient have a fever over 100, cough, congestion, sore throat, runny nose, lost of taste/smell within the last 5 days (please list symptoms that patient has) sore throat  Have you tested for Covid in the last 5 days? No   If yes, was it positive []  OR negative [] ? If positive in the last 5 days, please schedule virtual visit now. If negative, schedule for an in person OV with the next available provider if PCP has no openings. Please also let patient know they will be tested again (follow the script below)  "you will have to arrive 78mins prior to your appt time to be Covid tested. Please park in back of office at the cone & call [phone] to let the staff know you have arrived. A staff member will meet you at your car to do a rapid covid test. Once the test has resulted you will be notified by phone of your results to determine if appt will remain an in person visit or be converted to a virtual/phone visit. If you arrive less than 39mins before your appt time, your visit will be automatically converted to virtual & any recommended testing will happen AFTER the visit."  Pt will come in 30 min prior to appt time at 230 pm with dr banks  THINGS TO REMEMBER  If no availability for virtual visit in office,  please schedule another [place] office  If no availability at another [place] office, please instruct patient that they can schedule an evisit or virtual visit through their mychart account. Visits up to 8pm  patients can be seen in office 5 days after positive COVID test

## 2021-07-13 NOTE — Progress Notes (Signed)
Virtual Visit via Video Note  I connected with Krystal Monroe on 07/13/21 at  2:30 PM EST by a video enabled telemedicine application 2/2 [id number] pandemic and verified that I am speaking with the correct person using two identifiers.  Location patient: home Location provider:work or home office Persons participating in the virtual visit: patient, provider  I discussed the limitations of evaluation and management by telemedicine and the availability of in person appointments. The patient expressed understanding and agreed to proceed.  Chief Complaint  Patient presents with   Influenza   Sore Throat    Sore throat, cough started Friday morning, went away and came back on saturday. Got worse yesterday. Has had headache for 48 hours, runny nose that burns, with yellowish-green mucus. Has tried nasacort , tylenol and mucinex    HPI: Pt is a 70 yo female with pmh sig for HTN, OSA, GERD, trigeminal neuralgia, RLS who was seen for acute concern.  Pt tested positive with Influenza A today prior to visit.  POC COVID testing was negative.  Pt woke up Friday (11/25) am with a sore throat that improved during the day.  On "Sunday pt woke up with HA, sinus congestion, sore throat.  Productive cough started today.  Denies fever, n/v, diarrhea.  Taking Advil and extra strength tylenol alternating, mucinex.  Pt's grandchildren and their father were sick a little over a wk ago with the flu.  Patient just received influenza vaccine on 07/03/2021.  ROS: See pertinent positives and negatives per HPI.  Past Medical History:  Diagnosis Date   Anxiety    son killed in MVA   Asthma    as a child   Basal cell carcinoma of nose    Colon polyps    2011   Depression    son killed in MVA   Diverticulosis of colon    diverticulitits   Gallstones    GERD (gastroesophageal reflux disease)    Hypertension    Obesity    Sleep apnea    mild per pt-no tx   Trigeminal neuralgia     Past Surgical History:   Procedure Laterality Date   ABDOMINAL HYSTERECTOMY     19" 87   CATARACT EXTRACTION Left 06/05/2020   per patient   [place], 1984   x 3   CHOLECYSTECTOMY  2002   COLONOSCOPY  last 03/15/2016   EYE SURGERY     ROTATOR CUFF REPAIR Right 2017   TOTAL ABDOMINAL HYSTERECTOMY W/ BILATERAL SALPINGOOPHORECTOMY  2001   UPPER GASTROINTESTINAL ENDOSCOPY      Family History  Problem Relation Age of Onset   Diabetes Maternal Grandmother    Crohn's disease Father    Lung cancer Father    Breast cancer Mother    Osteoporosis Mother    Dementia Mother    Diabetes Mother    Hypertension Mother    Colon cancer Neg Hx    Esophageal cancer Neg Hx    Rectal cancer Neg Hx    Stomach cancer Neg Hx    Colon polyps Neg Hx     Current Outpatient Medications:    cholecalciferol (VITAMIN D3) 25 MCG (1000 UNIT) tablet, Take 1,000 Units by mouth 2 (two) times daily., Disp: , Rfl:    escitalopram (LEXAPRO) 10 MG tablet, TAKE ONE TABLET BY MOUTH ONCE DAILY, Disp: 90 tablet, Rfl: 1   omeprazole (PRILOSEC) 20 MG capsule, TAKE ONE TABLET BY MOUTH EVERY OTHER DAY, Disp: 90 capsule, Rfl: 1  psyllium (METAMUCIL) 58.6 % powder, Take 1 packet by mouth daily., Disp: , Rfl:    triamterene-hydrochlorothiazide (DYAZIDE) 37.5-25 MG capsule, TAKE ONE CAPSULE BY MOUTH ONCE DAILY, Disp: 90 capsule, Rfl: 3  EXAM:  VITALS per patient if applicable: wt 623 lbs  GENERAL: alert, oriented, appears well and in no acute distress  HEENT: atraumatic, conjunctiva clear, no obvious abnormalities on inspection of external nose and ears  NECK: normal movements of the head and neck  LUNGS: Coughing, on inspection no signs of respiratory distress, breathing rate appears normal, no obvious gross SOB, gasping or wheezing  CV: no obvious cyanosis  MS: moves all visible extremities without noticeable abnormality  PSYCH/NEURO: pleasant and cooperative, no obvious depression or anxiety, speech and thought  processing grossly intact  ASSESSMENT AND PLAN:  Discussed the following assessment and plan:  Influenza A  - Plan: oseltamivir (TAMIFLU) 75 MG capsule  Sore throat  - Plan: POC COVID-19, POC Influenza A/B  Cough, unspecified type  - Plan: POC COVID-19, POC Influenza A/B, benzonatate (TESSALON) 100 MG capsule  Symptoms 2/2 influenza A as POC testing prior to appointment positive.  COVID testing negative.  Given duration of symptoms discussed r/b/a of antiviral medications.  Patient wishes to proceed with starting Tamiflu.  Continue supportive care including rest, hydration, OTC NSAIDs or Tylenol, gargling with warm salt water or Chloraseptic spray.  Okay to use Nasacort nasal spray.  Rx for Tessalon sent to pharmacy.  Given precautions.  Follow-up as needed for continued or worsening symptoms.   I discussed the assessment and treatment plan with the patient. The patient was provided an opportunity to ask questions and all were answered. The patient agreed with the plan and demonstrated an understanding of the instructions.   The patient was advised to call back or seek an in-person evaluation if the symptoms worsen or if the condition fails to improve as anticipated.   Billie Ruddy, MD

## 2021-09-10 ENCOUNTER — Other Ambulatory Visit: Payer: Self-pay | Admitting: Adult Health

## 2021-09-10 DIAGNOSIS — F419 Anxiety disorder, unspecified: Secondary | ICD-10-CM

## 2021-09-10 DIAGNOSIS — F32A Depression, unspecified: Secondary | ICD-10-CM

## 2021-09-11 ENCOUNTER — Telehealth: Payer: Self-pay | Admitting: Pharmacist

## 2021-09-11 NOTE — Chronic Care Management (AMB) (Signed)
Chronic Care Management Pharmacy Assistant   Name: Krystal Monroe  MRN: [id number] DOB: [birth year]  Reason for Encounter: Medication Review Medication Coordination   Conditions to be addressed/monitored: HTN  Recent office visits:  07/13/21 Billie Ruddy, MD - Patient presented via video visit for Influenza A and other concerns. Prescribed Benzonatate & Oseltamivir Phosphate.  07/03/21 [name], NP - Patient presented for Essential hypertension. No medication changes.  Recent consult visits:  None  Hospital visits:  None in previous 6 months  Medications: Outpatient Encounter Medications as of 09/11/2021  Medication Sig   benzonatate (TESSALON) 100 MG capsule Take 1 capsule (100 mg total) by mouth 2 (two) times daily as needed for cough.   cholecalciferol (VITAMIN D3) 25 MCG (1000 UNIT) tablet Take 1,000 Units by mouth 2 (two) times daily.   escitalopram (LEXAPRO) 10 MG tablet TAKE ONE TABLET BY MOUTH ONCE DAILY   omeprazole (PRILOSEC) 20 MG capsule TAKE ONE TABLET BY MOUTH EVERY OTHER DAY   psyllium (METAMUCIL) 58.6 % powder Take 1 packet by mouth daily.   triamterene-hydrochlorothiazide (DYAZIDE) 37.5-25 MG capsule TAKE ONE CAPSULE BY MOUTH ONCE DAILY   No facility-administered encounter medications on file as of 09/11/2021.   Reviewed chart for medication changes ahead of medication coordination call.  No OVs, Consults, or hospital visits since last care coordination call/Pharmacist visit.  No medication changes indicated.  BP Readings from Last 3 Encounters:  07/03/21 126/60  10/01/20 133/78  07/01/20 120/70    Lab Results  Component Value Date   HGBA1C 6.1 07/03/2021  Reviewed chart prior to disease state call. Spoke with patient regarding BP  Recent Office Vitals: BP Readings from Last 3 Encounters:  07/03/21 126/60  10/01/20 133/78  07/01/20 120/70   Pulse Readings from Last 3 Encounters:  07/03/21 67  10/01/20 69  07/01/20 67    Wt  Readings from Last 3 Encounters:  07/03/21 169 lb (76.7 kg)  10/01/20 169 lb (76.7 kg)  07/01/20 174 lb 6.4 oz (79.1 kg)     Kidney Function Lab Results  Component Value Date/Time   CREATININE 0.75 07/03/2021 08:39 AM   CREATININE 0.83 07/04/2020 09:10 AM   CREATININE 0.77 06/14/2019 08:47 AM   GFR 79.82 07/03/2021 08:39 AM   GFRNONAA 71 07/04/2020 09:10 AM   GFRAA 83 07/04/2020 09:10 AM    BMP Latest Ref Rng & Units 07/03/2021 07/04/2020 06/14/2019  Glucose 70 - 99 mg/dL 111(H) 107(H) 113(H)  BUN 6 - 23 mg/dL 15 17 14   Creatinine 0.40 - 1.20 mg/dL 0.75 0.83 0.77  BUN/Creat Ratio 6 - 22 (calc) - NOT APPLICABLE -  Sodium 387 - 145 mEq/L 138 140 139  Potassium 3.5 - 5.1 mEq/L 3.8 4.6 3.8  Chloride 96 - 112 mEq/L 101 102 101  CO2 19 - 32 mEq/L 28 29 29   Calcium 8.4 - 10.5 mg/dL 9.8 9.9 9.5    Current antihypertensive regimen:  Triamterene-HCTZ 37.5-25 mg daily How often are you checking your Blood Pressure? weekly Current home BP readings: 118-120/60 BP Readings from Last 3 Encounters:  07/03/21 126/60  10/01/20 133/78  07/01/20 120/70   What recent interventions/DTPs have been made by any provider to improve Blood Pressure control since last CPP Visit: Patient reports no changes Any recent hospitalizations or ED visits since last visit with CPP? No   Adherence Review: Is the patient currently on ACE/ARB medication? No Does the patient have >5 day gap between last estimated fill dates? No  Patient obtains medications through Vials  90 Days   Last adherence delivery included:   Omeprazole (PRILOSEC) : 20 mg : Take one tab by mouth every other day Escitalopram (LEXAPRO) 10 mg : Take one tab a day Triamterene-hydrochlorothiazide (DYAZIDE) 375.-25 mg :Take one tab a day  Patient is due for next adherence delivery on: 09/21/21. Called patient and reviewed medications and coordinated delivery. Confirmed Vials for 90 DS  This delivery to include:  Omeprazole  (PRILOSEC) : 20 mg : Take one tab by mouth every other day Escitalopram (LEXAPRO) 10 mg : Take one tab a day Triamterene-hydrochlorothiazide (DYAZIDE) 375.-25 mg :Take one tab a day  Confirmed delivery date of 09/21/21, advised patient that pharmacy will contact them the morning of delivery.   Care Gaps: AWV - Done 6/22 Zoster Vaccine - Overdue COVID Booster #3 [employer] ) - Overdue CCM- Not scheduled/ declined for now BP-120/60 (home 09/14/21)  Star Rating Drugs: None   Ned Clines CMA Clinical Pharmacist Assistant [phone]

## 2021-12-11 ENCOUNTER — Telehealth: Payer: Self-pay | Admitting: Pharmacist

## 2021-12-11 NOTE — Chronic Care Management (AMB) (Signed)
? ? ?  Chronic Care Management ?Pharmacy Assistant  ? ?Name: PALLAVI CLIFTON  MRN: [id number] DOB: [birth year] ? ?Reason for Encounter: Medication Review / Medication Coordination ? ?Recent office visits:  ?None ? ?Recent consult visits:  ?None ? ?Hospital visits:  ?None in previous 6 months ? ?Medications: ?Outpatient Encounter Medications as of 12/11/2021  ?Medication Sig  ? benzonatate (TESSALON) 100 MG capsule Take 1 capsule (100 mg total) by mouth 2 (two) times daily as needed for cough.  ? cholecalciferol (VITAMIN D3) 25 MCG (1000 UNIT) tablet Take 1,000 Units by mouth 2 (two) times daily.  ? escitalopram (LEXAPRO) 10 MG tablet TAKE ONE TABLET BY MOUTH ONCE DAILY  ? omeprazole (PRILOSEC) 20 MG capsule TAKE ONE TABLET BY MOUTH EVERY OTHER DAY  ? psyllium (METAMUCIL) 58.6 % powder Take 1 packet by mouth daily.  ? triamterene-hydrochlorothiazide (DYAZIDE) 37.5-25 MG capsule TAKE ONE CAPSULE BY MOUTH ONCE DAILY  ? ?No facility-administered encounter medications on file as of 12/11/2021.  ?Reviewed chart for medication changes ahead of medication coordination call. ? ?No OVs, Consults, or hospital visits since last care coordination call/Pharmacist visit.  ? ?No medication changes indicated . ? ?BP Readings from Last 3 Encounters:  ?07/03/21 126/60  ?10/01/20 133/78  ?07/01/20 120/70  ?  ?Lab Results  ?Component Value Date  ? HGBA1C 6.1 07/03/2021  ?  ? ?Patient obtains medications through Vials W/ Safety Caps 90 Days  ? ?Last adherence delivery included:  ?Omeprazole (PRILOSEC) : 20 mg : Take one tab by mouth every other day ?Escitalopram (LEXAPRO) 10 mg : Take one tab a day ?Triamterene-hydrochlorothiazide (DYAZIDE) 375.-25 mg :Take one tab a day ? ?Patient is due for next adherence delivery on: 12/22/21. ?Called patient and reviewed medications and coordinated delivery. ?Vials W Safety Caps 90 DS ? ?This delivery to include: ?Omeprazole (PRILOSEC) : 20 mg : Take one tab by mouth every other day ?Escitalopram (LEXAPRO) 10  mg : Take one tab a day ?Triamterene-hydrochlorothiazide (DYAZIDE) 375.-25 mg :Take one tab a day ? ? ?Confirmed delivery date of 12/22/21, advised patient that pharmacy will contact them the morning of delivery.  ? ?Care Gaps: ?Zoster Vaccine - Overdue ?COVID Booster - Overdue ?CCM- CCS ?AWV- 6/22 ?BP- 126/60 ( 07/03/21) ? ?Star Rating Drugs: ?None ? ? ? ?Ned Clines CMA ?Clinical Pharmacist Assistant ?[phone] ? ?

## 2021-12-25 IMAGING — DX DG CHEST 2V
2 series · 2 of 2 positions shown · non-contrast
Comparison: 05/22/2018

CLINICAL DATA: Chest heaviness

EXAM:
CHEST - 2 VIEW

[chest pa]
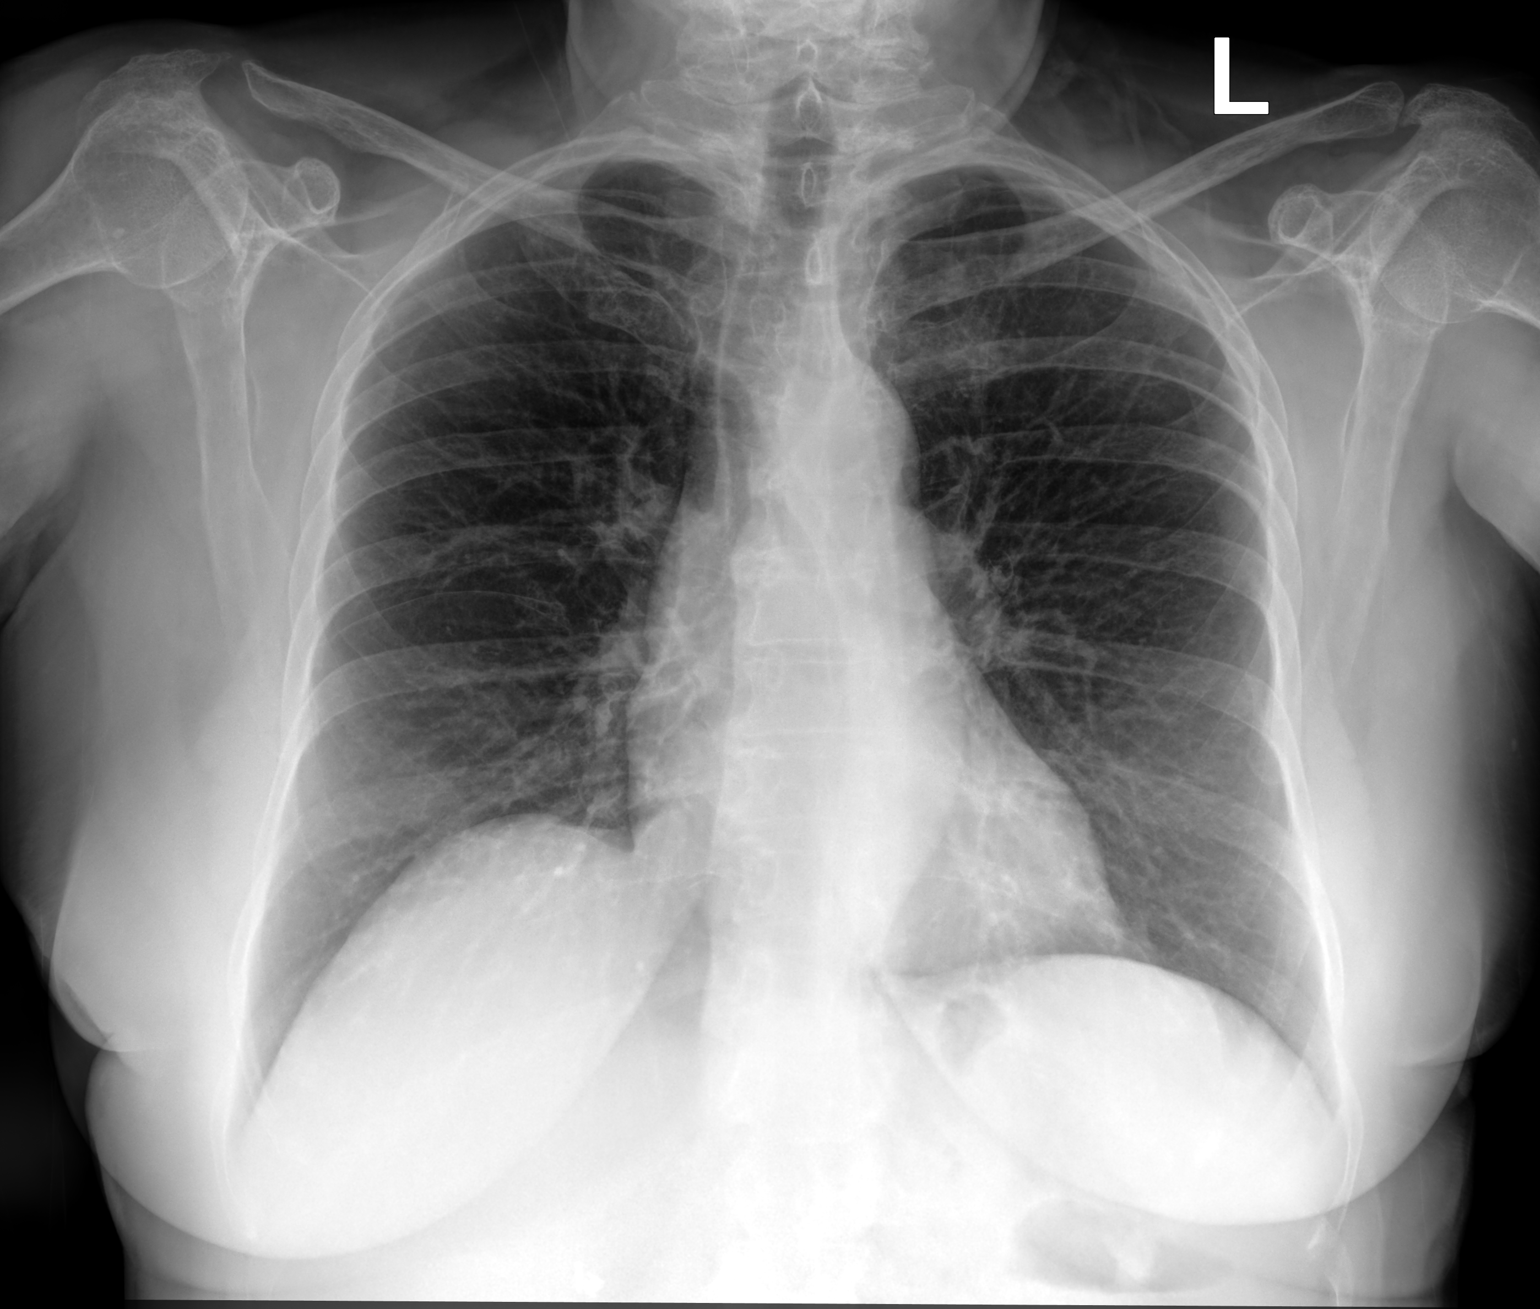

[chest lat]
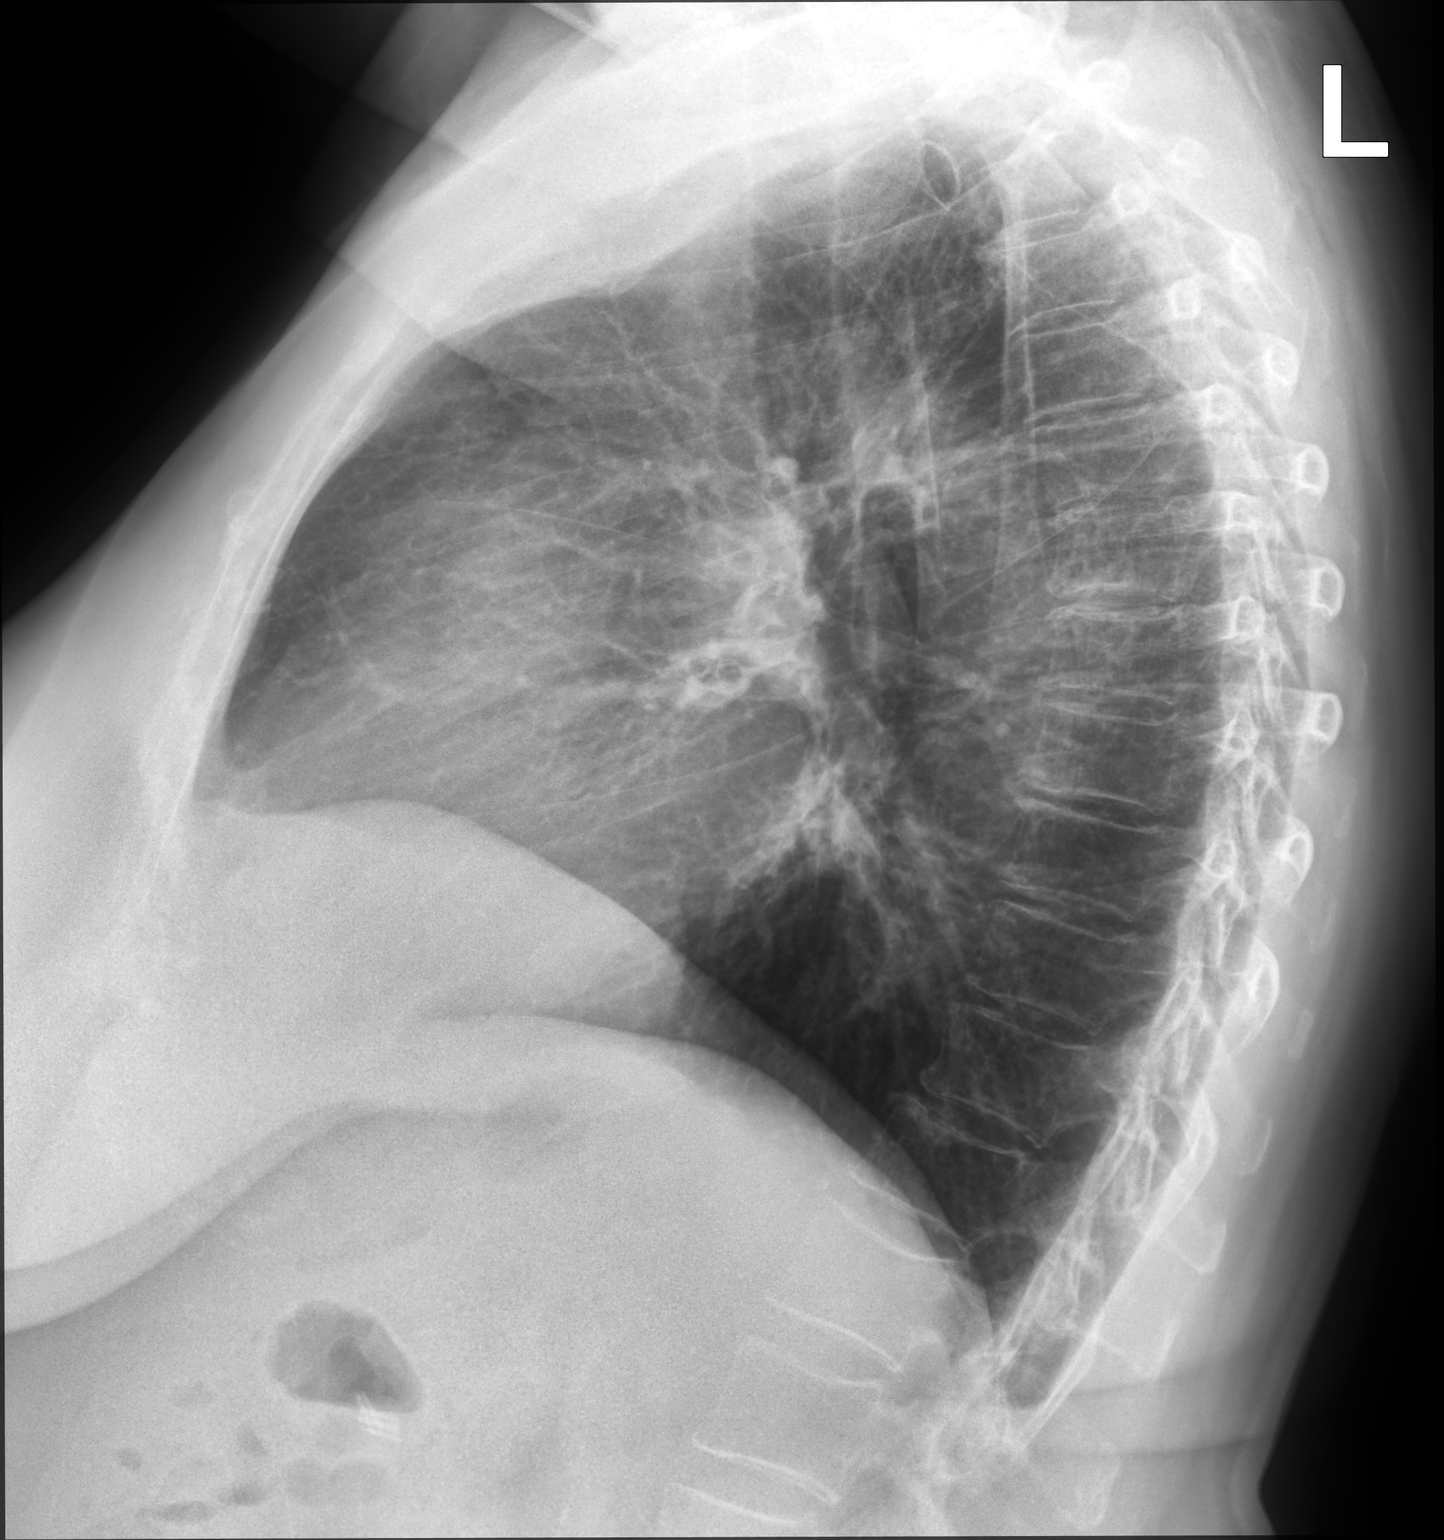

[2 of 2 positions shown; findings below may reference images not displayed]

FINDINGS: The heart size and mediastinal contours are within normal limits.
Both lungs are clear. Disc degenerative disease of the thoracic
spine.
IMPRESSION: No acute abnormality of the lungs.

## 2022-01-13 ENCOUNTER — Telehealth: Payer: Self-pay | Admitting: Adult Health

## 2022-01-13 NOTE — Telephone Encounter (Signed)
Left message for patient to call back and schedule Medicare Annual Wellness Visit (AWV) either virtually or in office. Left  my Herbie Drape number [phone]   Last AWV ;01/14/21  please schedule at anytime with [facility] Nurse Health Advisor 1 or 2

## 2022-01-18 ENCOUNTER — Ambulatory Visit (INDEPENDENT_AMBULATORY_CARE_PROVIDER_SITE_OTHER): Payer: PPO

## 2022-01-18 VITALS — Ht 64.0 in | Wt 169.0 lb

## 2022-01-18 DIAGNOSIS — Z0000 Encounter for general adult medical examination without abnormal findings: Secondary | ICD-10-CM | POA: Diagnosis not present

## 2022-01-18 DIAGNOSIS — Z1231 Encounter for screening mammogram for malignant neoplasm of breast: Secondary | ICD-10-CM | POA: Diagnosis not present

## 2022-01-18 LAB — HM MAMMOGRAPHY

## 2022-01-18 NOTE — Progress Notes (Signed)
I connected with Krystal Monroe today by telephone and verified that I am speaking with the correct person using two identifiers. Location patient: home Location provider: work Persons participating in the virtual visit: Retaj, Hilbun LPN.   I discussed the limitations, risks, security and privacy concerns of performing an evaluation and management service by telephone and the availability of in person appointments. I also discussed with the patient that there may be a patient responsible charge related to this service. The patient expressed understanding and verbally consented to this telephonic visit.    Interactive audio and video telecommunications were attempted between this provider and patient, however failed, due to patient having technical difficulties OR patient did not have access to video capability.  We continued and completed visit with audio only.     Vital signs may be patient reported or missing.  Subjective:   Krystal Monroe is a 71 y.o. female who presents for Medicare Annual (Subsequent) preventive examination.  Review of Systems     Cardiac Risk Factors include: advanced age (>36mn, >>38women);hypertension     Objective:    Today's Vitals   01/18/22 1027  Weight: 169 lb (76.7 kg)  Height: '5\' 4"'$  (1.626 m)   Body mass index is 29.01 kg/m.     01/18/2022   10:35 AM 01/14/2021    2:51 PM 10/19/2018   10:29 AM 01/17/2017   10:28 AM 12/28/2016   10:55 AM 05/17/2016    2:23 PM 03/15/2016    7:27 AM  Advanced Directives  Does Patient Have a Medical Advance Directive? Yes Yes Yes No No No No  Type of AParamedicof AH[place]uck[place]Living will HMille LacsLiving will H[place] EstatesLiving will      Does patient want to make changes to medical advance directive?   No - Patient declined      Copy of HNiotain Chart? No - copy requested No - copy requested No - copy requested      Would patient  like information on creating a medical advance directive?    No - Patient declined       Current Medications (verified) Outpatient Encounter Medications as of 01/18/2022  Medication Sig   benzonatate (TESSALON) 100 MG capsule Take 1 capsule (100 mg total) by mouth 2 (two) times daily as needed for cough.   cholecalciferol (VITAMIN D3) 25 MCG (1000 UNIT) tablet Take 5,000 Units by mouth daily.   escitalopram (LEXAPRO) 10 MG tablet TAKE ONE TABLET BY MOUTH ONCE DAILY   omeprazole (PRILOSEC) 20 MG capsule TAKE ONE TABLET BY MOUTH EVERY OTHER DAY (Patient taking differently: Patient is taking daily at this time)   psyllium (METAMUCIL) 58.6 % powder Take 1 packet by mouth daily.   triamterene-hydrochlorothiazide (DYAZIDE) 37.5-25 MG capsule TAKE ONE CAPSULE BY MOUTH ONCE DAILY   No facility-administered encounter medications on file as of 01/18/2022.    Allergies (verified) Iodine-131 and Other   History: Past Medical History:  Diagnosis Date   Anxiety    son killed in MVA   Asthma    as a child   Basal cell carcinoma of nose    Colon polyps    2011   Depression    son killed in MVA   Diverticulosis of colon    diverticulitits   Gallstones    GERD (gastroesophageal reflux disease)    Hypertension    Obesity    Sleep apnea    mild per  pt-no tx   Trigeminal neuralgia    Past Surgical History:  Procedure Laterality Date   ABDOMINAL HYSTERECTOMY     1997   CATARACT EXTRACTION Left 06/05/2020   per patient   [place], 1982, 1984   x 3   CHOLECYSTECTOMY  2002   COLONOSCOPY  last 03/15/2016   EYE SURGERY     ROTATOR CUFF REPAIR Right 2017   TOTAL ABDOMINAL HYSTERECTOMY W/ BILATERAL SALPINGOOPHORECTOMY  2001   UPPER GASTROINTESTINAL ENDOSCOPY     Family History  Problem Relation Age of Onset   Diabetes Maternal Grandmother    Crohn's disease Father    Lung cancer Father    Breast cancer Mother    Osteoporosis Mother    Dementia Mother    Diabetes Mother     Hypertension Mother    Colon cancer Neg Hx    Esophageal cancer Neg Hx    Rectal cancer Neg Hx    Stomach cancer Neg Hx    Colon polyps Neg Hx    Social History   Socioeconomic History   Marital status: Married    Spouse name: Not on file   Number of children: 3   Years of education: BSN   Highest education level: Bachelor's degree (e.g., BA, AB, BS)  Occupational History   Occupation: Therapist, 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   Enjoys reading, needlework, painting            Social Determinants of Health   Financial Resource Strain: Low Risk    Difficulty of Paying Living Expenses: Not hard at all  Food Insecurity: No Food Insecurity   Worried About Charity fundraiser in the Last Year: Never true   Arboriculturist in the Last Year: Never true  Transportation Needs: No Transportation Needs   Lack of Transportation (Medical): No   Lack of Transportation (Non-Medical): No  Physical Activity: Sufficiently Active   Days of Exercise per Week: 2 days   Minutes of Exercise per Session: 90 min  Stress: No Stress Concern Present   Feeling of Stress : Not at all  Social Connections: Not on file    Tobacco Counseling Counseling given: Not Answered   Clinical Intake:  Pre-visit preparation completed: Yes  Pain : No/denies pain     Nutritional Status:  BMI 25 -29 Overweight Nutritional Risks: None Diabetes: No  How often do you need to have someone help you when you read instructions, pamphlets, or other written materials from your doctor or pharmacy?: 1 - Never What is the last grade level you completed in school?: BS nursing  Diabetic? no  Interpreter Needed?: No  Information entered by :: NAllen LPN  Activities of Daily Living    01/18/2022   10:36 AM  In your present state of health, do you have any difficulty performing the following activities:  Hearing? 0  Vision? 0  Difficulty concentrating or making decisions? 0  Walking or climbing stairs? 0  Dressing or bathing? 0  Doing errands, shopping? 0  Preparing Food and eating ? N  Using the Toilet? N  In the past six months, have you accidently leaked urine? N  Do you have problems with loss of bowel control? N  Managing your Medications? N  Managing your Finances? N  Housekeeping or managing your Housekeeping? N    Patient Care Team: [name], NP as PCP - General (Family Medicine) Dohmeier, Asencion Partridge, MD as Consulting Physician (Neurology) [name], MD as Consulting Physician (Ophthalmology) Viona Gilmore, [facility] as Pharmacist (Pharmacist)  Indicate any recent Medical Services you may have received from other than [facility] providers in the past year (date may be approximate).     Assessment:   This is a routine wellness examination for Krystal Monroe.  Hearing/Vision screen Vision Screening - Comments:: Regular eye exams, [facility]  Dietary issues and exercise activities discussed: Current Exercise Habits: Home exercise routine, Type of exercise: treadmill, Frequency (Times/Week): 2   Goals Addressed             This Visit's Progress    Patient Stated       01/18/2022, wants to weigh 165 pounds by the time she sees [place] in November       Depression Screen    01/18/2022   10:36 AM 01/14/2021    2:52 PM 10/19/2018   10:29 AM 04/18/2018    9:10 AM  01/17/2017   10:21 AM 11/08/2016    2:26 PM 10/01/2015    9:30 AM  PHQ 2/9 Scores  PHQ - 2 Score 0 0 0 0 0 0 0  PHQ- 9 Score   0        Fall Risk    01/18/2022   10:36 AM 01/14/2021    2:51 PM 07/01/2020   12:52 PM 10/16/2019    2:21 PM 10/19/2018   10:29 AM  [place] in the past year? 0 0 0 0 0  Number falls in past yr: 0 0 0    Injury with Fall? 0 0 0    Risk for fall due to : Medication side effect      Follow up Falls evaluation completed;Education provided;Falls prevention discussed Falls evaluation completed       FALL RISK PREVENTION PERTAINING TO THE HOME:  Any stairs in or around the home? Yes  If so, are there any without handrails? No  Home free of loose throw rugs in walkways, pet beds, electrical cords, etc? Yes  Adequate lighting in your home to reduce risk of falls? Yes   ASSISTIVE DEVICES UTILIZED TO PREVENT FALLS:  Life alert? No  Use of a cane, walker or w/c? No  Grab bars in the bathroom? No  Shower chair or bench in shower? No  Elevated toilet seat or a handicapped toilet? Yes   TIMED UP AND GO:  Was the test performed? No .      Cognitive Function:        01/18/2022   10:37 AM  6CIT Screen  What Year? 0 points  What month? 0 points  What time? 0 points  Count back from 20 0 points  Months in reverse 0 points  Repeat phrase 2 points  Total Score 2 points    Immunizations Immunization History  Administered Date(s) Administered   Fluad Quad(high Dose 65+) 06/13/2019, 07/01/2020, 07/03/2021   Influenza Split 05/22/2012, 05/16/2013   Influenza Whole 08/16/2006   Influenza, High Dose Seasonal PF 04/24/2018   Influenza-Unspecified 04/26/2014, 05/09/2015, 03/22/2016   Moderna SARS-COV2 Booster Vaccination 04/14/2021   PFIZER(Purple Top)SARS-COV-2 Vaccination 09/30/2019, 10/23/2019   Pneumococcal Conjugate-13 10/01/2015   Pneumococcal Polysaccharide-23 12/28/2016   Td 08/16/1998, 10/11/2008   Zoster, Live 02/25/2011    TDAP status:  Due, Education has been provided regarding the importance of this vaccine. Advised may receive this vaccine at local pharmacy or Health Dept. Aware to provide a copy of the vaccination record if obtained from local pharmacy or Health Dept. Verbalized acceptance and understanding.  Flu Vaccine status: Up to date  Pneumococcal vaccine status: Up to date  Covid-19 vaccine status: Completed vaccines  Qualifies for Shingles Vaccine? Yes   Zostavax completed Yes   Shingrix Completed?: No.    Education has been provided regarding the importance of this vaccine. Patient has been advised to call insurance company to determine out of pocket expense if they have not yet received this vaccine. Advised may also receive vaccine at local pharmacy or Health Dept. Verbalized acceptance and understanding.  Screening Tests Health Maintenance  Topic Date Due   Zoster Vaccines- Shingrix (1 of 2) Never done   COVID-19 Vaccine (3 - Booster for Pfizer series) 06/09/2021   MAMMOGRAM  01/15/2022   INFLUENZA VACCINE  03/16/2022   COLONOSCOPY (Pts 45-64yr Insurance coverage will need to be confirmed)  05/22/2022   Pneumonia Vaccine 71 Years old  Completed   DEXA SCAN  Completed   Hepatitis C Screening  Completed   HPV VACCINES  Aged Out    Health Maintenance  Health Maintenance Due  Topic Date Due   Zoster Vaccines- Shingrix (1 of 2) Never done   COVID-19 Vaccine (3 - Booster for Pfizer series) 06/09/2021   MAMMOGRAM  01/15/2022    Colorectal cancer screening: Type of screening: Colonoscopy. Completed 05/23/2019. Repeat every 3 years  Mammogram status: today  Bone Density status: Completed 11/15/2016.   Lung Cancer Screening: (Low Dose CT Chest recommended if Age 864-80years, 30 pack-year currently smoking OR have quit w/in 15years.) does not qualify.   Lung Cancer Screening Referral: no  Additional Screening:  Hepatitis C Screening: does qualify; Completed 07/03/2021  Vision Screening:  Recommended annual ophthalmology exams for early detection of glaucoma and other disorders of the eye. Is the patient up to date with their annual eye exam?  Yes  Who is the provider or what is the name of the [facility] in which the patient attends annual [facility] exams[facility] pt is not established with a provider, would they like to be referred to a provider to establish care? No .   Dental Screening: Recommended annual dental exams for proper oral hygiene  Community Resource Referral / Chronic Care Management: CRR required this visit?  No   CCM required this visit?  No      Plan:     I have personally reviewed and noted the following in the patient's chart:   Medical and social history Use of alcohol, tobacco or illicit drugs  Current medications and supplements including opioid prescriptions.  Functional ability and status Nutritional status Physical activity Advanced directives List of other physicians Hospitalizations, surgeries, and ER visits in previous 12 months Vitals Screenings to include cognitive, depression, and  falls Referrals and appointments  In addition, I have reviewed and discussed with patient certain preventive protocols, quality metrics, and best practice recommendations. A written personalized care plan for preventive services as well as general preventive health recommendations were provided to patient.     [name], LPN   09/20/4860   Nurse Notes: none  Due to this being a virtual visit, the after visit summary with patients personalized plan was offered to patient via mail or my-chart. Patient would like to access on my-chart

## 2022-01-18 NOTE — Patient Instructions (Signed)
Krystal Monroe , Thank you for taking time to come for your Medicare Wellness Visit. I appreciate your ongoing commitment to your health goals. Please review the following plan we discussed and let me know if I can assist you in the future.   Screening recommendations/referrals: Colonoscopy: completed 05/23/2019, due 05/22/2022 Mammogram: scheduled for today Bone Density: completed 11/15/2016 Recommended yearly ophthalmology/optometry visit for glaucoma screening and checkup Recommended yearly dental visit for hygiene and checkup  Vaccinations: Influenza vaccine: due 03/16/2022 Pneumococcal vaccine: completed 12/28/2016 Tdap vaccine: due Shingles vaccine: discussed    Covid-19: 04/14/2021, 10/23/2019, 09/30/2019  Advanced directives: Please bring a copy of your POA (Power of Attorney) and/or Living Will to your next appointment.   Conditions/risks identified: none  Next appointment: Follow up in one year for your annual wellness visit    Preventive Care 65 Years and Older, Female Preventive care refers to lifestyle choices and visits with your health care provider that can promote health and wellness. What does preventive care include? A yearly physical exam. This is also called an annual well check. Dental exams once or twice a year. Routine eye exams. Ask your health care provider how often you should have your eyes checked. Personal lifestyle choices, including: Daily care of your teeth and gums. Regular physical activity. Eating a healthy diet. Avoiding tobacco and drug use. Limiting alcohol use. Practicing safe sex. Taking low-dose aspirin every day. Taking vitamin and mineral supplements as recommended by your health care provider. What happens during an annual well check? The services and screenings done by your health care provider during your annual well check will depend on your age, overall health, lifestyle risk factors, and family history of disease. Counseling  Your health  care provider may ask you questions about your: Alcohol use. Tobacco use. Drug use. Emotional well-being. Home and relationship well-being. Sexual activity. Eating habits. History of falls. Memory and ability to understand (cognition). Work and work Statistician. Reproductive health. Screening  You may have the following tests or measurements: Height, weight, and BMI. Blood pressure. Lipid and cholesterol levels. These may be checked every 5 years, or more frequently if you are over 54 years old. Skin check. Lung cancer screening. You may have this screening every year starting at age 73 if you have a 30-pack-year history of smoking and currently smoke or have quit within the past 15 years. Fecal occult blood test (FOBT) of the stool. You may have this test every year starting at age 73. Flexible sigmoidoscopy or colonoscopy. You may have a sigmoidoscopy every 5 years or a colonoscopy every 10 years starting at age 73. Hepatitis C blood test. Hepatitis B blood test. Sexually transmitted disease (STD) testing. Diabetes screening. This is done by checking your blood sugar (glucose) after you have not eaten for a while (fasting). You may have this done every 1-3 years. Bone density scan. This is done to screen for osteoporosis. You may have this done starting at age 73. Mammogram. This may be done every 1-2 years. Talk to your health care provider about how often you should have regular mammograms. Talk with your health care provider about your test results, treatment options, and if necessary, the need for more tests. Vaccines  Your health care provider may recommend certain vaccines, such as: Influenza vaccine. This is recommended every year. Tetanus, diphtheria, and acellular pertussis (Tdap, Td) vaccine. You may need a Td booster every 10 years. Zoster vaccine. You may need this after age 73. Pneumococcal 13-valent conjugate (PCV13) vaccine. One  dose is recommended after age  21. Pneumococcal polysaccharide (PPSV23) vaccine. One dose is recommended after age 20. Talk to your health care provider about which screenings and vaccines you need and how often you need them. This information is not intended to replace advice given to you by your health care provider. Make sure you discuss any questions you have with your health care provider. Document Released: 08/29/2015 Document Revised: 04/21/2016 Document Reviewed: 06/03/2015 Elsevier Interactive Patient Education  2017 [place] Prevention in the Home Falls can cause injuries. They can happen to people of all ages. There are many things you can do to make your home safe and to help prevent falls. What can I do on the outside of my home? Regularly fix the edges of walkways and driveways and fix any cracks. Remove anything that might make you trip as you walk through a door, such as a raised step or threshold. Trim any bushes or trees on the path to your home. Use bright outdoor lighting. Clear any walking paths of anything that might make someone trip, such as rocks or tools. Regularly check to see if handrails are loose or broken. Make sure that both sides of any steps have handrails. Any raised decks and porches should have guardrails on the edges. Have any leaves, snow, or ice cleared regularly. Use sand or salt on walking paths during winter. Clean up any spills in your garage right away. This includes oil or grease spills. What can I do in the bathroom? Use night lights. Install grab bars by the toilet and in the tub and shower. Do not use towel bars as grab bars. Use non-skid mats or decals in the tub or shower. If you need to sit down in the shower, use a plastic, non-slip stool. Keep the floor dry. Clean up any water that spills on the floor as soon as it happens. Remove soap buildup in the tub or shower regularly. Attach bath mats securely with double-sided non-slip rug tape. Do not have throw  rugs and other things on the floor that can make you trip. What can I do in the bedroom? Use night lights. Make sure that you have a light by your bed that is easy to reach. Do not use any sheets or blankets that are too big for your bed. They should not hang down onto the floor. Have a firm chair that has side arms. You can use this for support while you get dressed. Do not have throw rugs and other things on the floor that can make you trip. What can I do in the kitchen? Clean up any spills right away. Avoid walking on wet floors. Keep items that you use a lot in easy-to-reach places. If you need to reach something above you, use a strong step stool that has a grab bar. Keep electrical cords out of the way. Do not use floor polish or wax that makes floors slippery. If you must use wax, use non-skid floor wax. Do not have throw rugs and other things on the floor that can make you trip. What can I do with my stairs? Do not leave any items on the stairs. Make sure that there are handrails on both sides of the stairs and use them. Fix handrails that are broken or loose. Make sure that handrails are as long as the stairways. Check any carpeting to make sure that it is firmly attached to the stairs. Fix any carpet that is loose or worn.  Avoid having throw rugs at the top or bottom of the stairs. If you do have throw rugs, attach them to the floor with carpet tape. Make sure that you have a light switch at the top of the stairs and the bottom of the stairs. If you do not have them, ask someone to add them for you. What else can I do to help prevent falls? Wear shoes that: Do not have high heels. Have rubber bottoms. Are comfortable and fit you well. Are closed at the toe. Do not wear sandals. If you use a stepladder: Make sure that it is fully opened. Do not climb a closed stepladder. Make sure that both sides of the stepladder are locked into place. Ask someone to hold it for you, if  possible. Clearly mark and make sure that you can see: Any grab bars or handrails. First and last steps. Where the edge of each step is. Use tools that help you move around (mobility aids) if they are needed. These include: Canes. Walkers. Scooters. Crutches. Turn on the lights when you go into a dark area. Replace any light bulbs as soon as they burn out. Set up your furniture so you have a clear path. Avoid moving your furniture around. If any of your floors are uneven, fix them. If there are any pets around you, be aware of where they are. Review your medicines with your doctor. Some medicines can make you feel dizzy. This can increase your chance of falling. Ask your doctor what other things that you can do to help prevent falls. This information is not intended to replace advice given to you by your health care provider. Make sure you discuss any questions you have with your health care provider. Document Released: 05/29/2009 Document Revised: 01/08/2016 Document Reviewed: 09/06/2014 Elsevier Interactive Patient Education  2017 [employer].

## 2022-01-20 ENCOUNTER — Encounter: Payer: Self-pay | Admitting: Adult Health

## 2022-01-27 DIAGNOSIS — L570 Actinic keratosis: Secondary | ICD-10-CM | POA: Diagnosis not present

## 2022-01-27 DIAGNOSIS — L908 Other atrophic disorders of skin: Secondary | ICD-10-CM | POA: Diagnosis not present

## 2022-01-27 DIAGNOSIS — L821 Other seborrheic keratosis: Secondary | ICD-10-CM | POA: Diagnosis not present

## 2022-01-27 DIAGNOSIS — X32XXXA Exposure to sunlight, initial encounter: Secondary | ICD-10-CM | POA: Diagnosis not present

## 2022-03-09 ENCOUNTER — Other Ambulatory Visit: Payer: Self-pay | Admitting: Adult Health

## 2022-03-09 DIAGNOSIS — K2100 Gastro-esophageal reflux disease with esophagitis, without bleeding: Secondary | ICD-10-CM

## 2022-03-10 ENCOUNTER — Telehealth: Payer: Self-pay | Admitting: Pharmacist

## 2022-03-10 NOTE — Chronic Care Management (AMB) (Unsigned)
    Chronic Care Management Pharmacy Assistant   Name: Krystal Monroe  MRN: [id number] DOB: 1950/11/25  Reason for Encounter: Medication Review Medication Coordination    Recent office visits:  01/18/22 [name], LPN - Patient presented for Medicare annual wellness exam. Patient voiced goal weight of 165 by November. No medication changes.  Recent consult visits:  None  Hospital visits:  None in previous 6 months  Medications: Outpatient Encounter Medications as of 03/10/2022  Medication Sig   benzonatate (TESSALON) 100 MG capsule Take 1 capsule (100 mg total) by mouth 2 (two) times daily as needed for cough.   cholecalciferol (VITAMIN D3) 25 MCG (1000 UNIT) tablet Take 5,000 Units by mouth daily.   escitalopram (LEXAPRO) 10 MG tablet TAKE ONE TABLET BY MOUTH ONCE DAILY   omeprazole (PRILOSEC) 20 MG capsule TAKE ONE CAPSULE BY MOUTH EVERY OTHER DAY   psyllium (METAMUCIL) 58.6 % powder Take 1 packet by mouth daily.   triamterene-hydrochlorothiazide (DYAZIDE) 37.5-25 MG capsule TAKE ONE CAPSULE BY MOUTH ONCE DAILY   No facility-administered encounter medications on file as of 03/10/2022.    Reviewed chart for medication changes ahead of medication coordination call.  BP Readings from Last 3 Encounters:  07/03/21 126/60  10/01/20 133/78  07/01/20 120/70    Lab Results  Component Value Date   HGBA1C 6.1 07/03/2021     Patient obtains medications through Vials  90 Days   Last adherence delivery included Omeprazole (PRILOSEC) : 20 mg : Take one tab by mouth every other day Escitalopram (LEXAPRO) 10 mg : Take one tab a day Triamterene-hydrochlorothiazide (DYAZIDE) 375.-25 mg :Take one tab a day    Patient is due for next adherence delivery on: 03/22/22. Called patient and reviewed medications and coordinated delivery. Vials 90 DS  This delivery to include: Omeprazole (PRILOSEC) : 20 mg : Take one tab by mouth every other day Escitalopram (LEXAPRO) 10 mg : Take one  tab a day Triamterene-hydrochlorothiazide (DYAZIDE) 375.-25 mg :Take one tab a day   Patient needs refills for Vit D 3 5000 units daily, patient reports she usually gets from [facility] but has run out ad wanted it added to her order, advised I would put on her order and have customer service discuss price with her prior to delivery and she was in agreement..  Confirmed delivery date of 03/22/22, advised patient that pharmacy will contact them the morning of delivery.   Care Gaps: Zoster Vaccine - Overdue COVID booster - Overdue BP- 126/60 07/03/21 [facility]  Star Rating Drugs: None     Ned Clines CMA Clinical Pharmacist Assistant [phone]

## 2022-03-11 DIAGNOSIS — H2511 Age-related nuclear cataract, right eye: Secondary | ICD-10-CM | POA: Diagnosis not present

## 2022-03-11 DIAGNOSIS — H5213 Myopia, bilateral: Secondary | ICD-10-CM | POA: Diagnosis not present

## 2022-04-16 ENCOUNTER — Ambulatory Visit: Payer: PPO | Admitting: Family Medicine

## 2022-04-20 ENCOUNTER — Ambulatory Visit (INDEPENDENT_AMBULATORY_CARE_PROVIDER_SITE_OTHER): Payer: PPO | Admitting: Adult Health

## 2022-04-20 ENCOUNTER — Encounter: Payer: Self-pay | Admitting: Adult Health

## 2022-04-20 VITALS — BP 130/90 | HR 60 | Temp 98.8°F | Ht 64.0 in | Wt 173.0 lb

## 2022-04-20 DIAGNOSIS — R051 Acute cough: Secondary | ICD-10-CM | POA: Diagnosis not present

## 2022-04-20 LAB — POC COVID19 BINAXNOW: SARS Coronavirus 2 Ag: NEGATIVE

## 2022-04-20 LAB — POCT INFLUENZA A/B
Influenza A, POC: NEGATIVE
Influenza B, POC: NEGATIVE

## 2022-04-20 MED ORDER — HYDROCODONE BIT-HOMATROP MBR 5-1.5 MG/5ML PO SOLN
5.0000 mL | Freq: Three times a day (TID) | ORAL | 0 refills | Status: DC | PRN
Start: 2022-04-20 — End: 2022-05-26

## 2022-04-20 MED ORDER — BENZONATATE 200 MG PO CAPS
200.0000 mg | ORAL_CAPSULE | Freq: Three times a day (TID) | ORAL | 1 refills | Status: DC | PRN
Start: 2022-04-20 — End: 2022-05-26

## 2022-04-20 NOTE — Addendum Note (Signed)
Addended by: Gwenyth Ober R on: 04/20/2022 01:10 PM   Modules accepted: Orders

## 2022-04-20 NOTE — Progress Notes (Signed)
Subjective:    Patient ID: Krystal Monroe, female    DOB: 03-15-1951, 71 y.o.   MRN: [id number]  HPI 71 year old female who is being evaluated today for an acute issue.  She reports over the last 2 weeks she has had constant dry cough.  Cough has become less frequent but has become deeper.  She reports fatigue and some mild wheezing.  Has not had any chest congestion, shortness of breath, fevers, or chills.  She has developed postnasal drip but no sinus pain or pressure.  Her granddaughter was diagnosed with pneumonia recently, and her daughter and son-in-law also experiencing the same symptoms as Sharetha has been.  Has not been using any over-the-counter medications.   Review of Systems See HPI   Past Medical History:  Diagnosis Date   Anxiety    son killed in MVA   Asthma    as a child   Basal cell carcinoma of nose    Colon polyps    2011   Depression    son killed in MVA   Diverticulosis of colon    diverticulitits   Gallstones    GERD (gastroesophageal reflux disease)    Hypertension    Obesity    Sleep apnea    mild per pt-no tx   Trigeminal neuralgia     Social History   Socioeconomic History   Marital status: Married    Spouse name: Not on file   Number of children: 3   Years of education: BSN   Highest education level: Bachelor's degree (e.g., BA, AB, BS)  Occupational History   Occupation: Therapist, 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   Enjoys reading, needlework, painting            Social Determinants of Health   Financial Resource Strain: Low Risk  (04/19/2022)   Overall Financial Resource Strain (CARDIA)    Difficulty of Paying Living Expenses: Not hard at all  Food Insecurity: No Food Insecurity (04/19/2022)   Hunger Vital Sign    Worried About Running Out of Food in the Last Year: Never true    [place] in the Last Year: Never true  Transportation Needs: No Transportation Needs (04/19/2022)   PRAPARE - Hydrologist (Medical): No    Lack of Transportation (Non-Medical): No  Physical Activity: Insufficiently Active (04/19/2022)   Exercise Vital Sign    Days of Exercise per Week: 3 days    Minutes of Exercise per Session: 30 min  Stress: No Stress Concern Present (04/19/2022)   [place]    Feeling of Stress : Not at all  Social  Connections: Moderately Integrated (04/19/2022)   Social Connection and Isolation Panel [NHANES]    Frequency of Communication with Friends and Family: More than three times a week    Frequency of Social Gatherings with Friends and Family: Three times a week    Attends Religious Services: Never    Active Member of Clubs or Organizations: Yes    Attends Music therapist: More than 4 times per year    Marital Status: Married  Human resources officer Violence: Not on file    Past Surgical History:  Procedure Laterality Date   ABDOMINAL HYSTERECTOMY     1997   CATARACT EXTRACTION Left 06/05/2020   per patient   [place], 1982, 1984   x 3   CHOLECYSTECTOMY  2002   COLONOSCOPY  last 03/15/2016   EYE SURGERY     ROTATOR CUFF REPAIR Right 2017   TOTAL ABDOMINAL HYSTERECTOMY W/ BILATERAL SALPINGOOPHORECTOMY  2001   UPPER GASTROINTESTINAL ENDOSCOPY      Family  History  Problem Relation Age of Onset   Diabetes Maternal Grandmother    Crohn's disease Father    Lung cancer Father    Breast cancer Mother    Osteoporosis Mother    Dementia Mother    Diabetes Mother    Hypertension Mother    Colon cancer Neg Hx    Esophageal cancer Neg Hx    Rectal cancer Neg Hx    Stomach cancer Neg Hx    Colon polyps Neg Hx     Allergies  Allergen Reactions   Iodine-131 Rash   Other Hives, Rash and Other (See Comments)    Magnavist Contrast    Current Outpatient Medications on File Prior to Visit  Medication Sig Dispense Refill   cholecalciferol (VITAMIN D3) 25 MCG (1000 UNIT) tablet Take 5,000 Units by mouth daily.     escitalopram (LEXAPRO) 10 MG tablet TAKE ONE TABLET BY MOUTH ONCE DAILY 90 tablet 2   omeprazole (PRILOSEC) 20 MG capsule TAKE ONE CAPSULE BY MOUTH EVERY OTHER DAY 90 capsule 2   psyllium (METAMUCIL) 58.6 % powder Take 1 packet by mouth daily.     tretinoin (RETIN-A) 0.025 % cream SMARTSIG:1 sparingly Topical Every Night     triamterene-hydrochlorothiazide (DYAZIDE) 37.5-25 MG capsule TAKE ONE CAPSULE BY MOUTH ONCE DAILY 90 capsule 3   No current facility-administered medications on file prior to visit.    BP (!) 130/90   Pulse 60   Temp 98.8 F (37.1 C) (Oral)   Ht '5\' 4"'$  (1.626 m)   Wt 173 lb (78.5 kg)   SpO2 99%   BMI 29.70 kg/m       Objective:   Physical Exam Vitals and nursing note reviewed.  Constitutional:      Appearance: Normal appearance.  HENT:     Nose: Nose normal. No congestion or rhinorrhea.     Mouth/Throat:     Mouth: Mucous membranes are moist.     Pharynx: Oropharynx is clear.  Cardiovascular:     Rate and Rhythm: Normal rate and regular rhythm.     Pulses: Normal pulses.     Heart sounds: Normal heart sounds.  Pulmonary:     Effort: Pulmonary effort is normal.     Breath sounds: No stridor. Wheezing (trace expiratory wheezing) present. No rhonchi or rales.  Musculoskeletal:        General:  Normal range of motion.  Skin:    General: Skin is warm and dry.  Neurological:  General: No focal deficit present.     Mental Status: She is alert and oriented to person, place, and time.  Psychiatric:        Mood and Affect: Mood normal.        Behavior: Behavior normal.        Thought Content: Thought content normal.        Judgment: Judgment normal.        Assessment & Plan:  1. Acute cough -Likely viral.  Discussed prednisone therapy but she would like to hold off on this for now.  Will prescribe Tessalon Perles and Hycodan cough syrup.  Advised to follow-up in the next 3 to 4 days if not improving.  She can follow-up via MyChart - benzonatate (TESSALON) 200 MG capsule; Take 1 capsule (200 mg total) by mouth 3 (three) times daily as needed for cough.  Dispense: 30 capsule; Refill: 1 - HYDROcodone bit-homatropine (HYCODAN) 5-1.5 MG/5ML syrup; Take 5 mLs by mouth every 8 (eight) hours as needed for cough.  Dispense: 120 mL; Refill: 0  [name], NP

## 2022-05-12 ENCOUNTER — Encounter: Payer: Self-pay | Admitting: Gastroenterology

## 2022-05-26 ENCOUNTER — Ambulatory Visit (AMBULATORY_SURGERY_CENTER): Payer: Self-pay

## 2022-05-26 VITALS — Ht 64.0 in | Wt 173.0 lb

## 2022-05-26 DIAGNOSIS — Z86010 Personal history of colonic polyps: Secondary | ICD-10-CM

## 2022-05-26 MED ORDER — NA SULFATE-K SULFATE-MG SULF 17.5-3.13-1.6 GM/177ML PO SOLN: 1.0000 | ORAL | 0 refills | Status: DC

## 2022-05-26 NOTE — Progress Notes (Signed)
No egg or soy allergy known to patient  No issues known to pt with past sedation with any surgeries or procedures Patient denies ever being told they had issues or difficulty with intubation  No FH of Malignant Hyperthermia Pt is not on diet pills Pt is not on  home 02  Pt is not on blood thinners  Pt denies issues with constipation  No A fib or A flutter Have any cardiac testing pending--denied Pt instructed to use Singlecare.[email or web address] or GoodRx for a price reduction on prep   

## 2022-06-07 ENCOUNTER — Other Ambulatory Visit: Payer: Self-pay | Admitting: Adult Health

## 2022-06-07 DIAGNOSIS — F32A Depression, unspecified: Secondary | ICD-10-CM

## 2022-06-07 DIAGNOSIS — F419 Anxiety disorder, unspecified: Secondary | ICD-10-CM

## 2022-06-07 DIAGNOSIS — I10 Essential (primary) hypertension: Secondary | ICD-10-CM

## 2022-06-08 ENCOUNTER — Telehealth: Payer: Self-pay | Admitting: Pharmacist

## 2022-06-08 NOTE — Chronic Care Management (AMB) (Signed)
    Chronic Care Management Pharmacy Assistant   Name: Krystal Monroe  MRN: [id number] DOB: [birth year]  Reason for Encounter: Medication Review Medication Coordination   Recent office visits:  04/20/22 [name], NP - Patient presented for Acute cough. Prescribed Hydrocodone Bit Homatrop. Increased Benzonatate.  Recent consult visits:  03/11/22 [name] - Claims encounter for Myopia bilateral and other concerns. No other visit details available.  Hospital visits:  None in previous 6 months  Medications: Outpatient Encounter Medications as of 06/08/2022  Medication Sig   cholecalciferol (VITAMIN D3) 25 MCG (1000 UNIT) tablet Take 5,000 Units by mouth daily.   escitalopram (LEXAPRO) 10 MG tablet TAKE ONE TABLET BY MOUTH ONCE DAILY   Na Sulfate-K Sulfate-Mg Sulf 17.5-3.13-1.6 GM/177ML SOLN Take 1 kit by mouth as directed. May use generic Suprep, no prior authorization. Take as directed.   omeprazole (PRILOSEC) 20 MG capsule TAKE ONE CAPSULE BY MOUTH EVERY OTHER DAY   psyllium (METAMUCIL) 58.6 % powder Take 1 packet by mouth daily.   tretinoin (RETIN-A) 0.025 % cream SMARTSIG:1 sparingly Topical Every Night   triamterene-hydrochlorothiazide (DYAZIDE) 37.5-25 MG capsule TAKE ONE CAPSULE BY MOUTH ONCE DAILY   No facility-administered encounter medications on file as of 06/08/2022.  Reviewed chart for medication changes ahead of medication coordination call.   BP Readings from Last 3 Encounters:  04/20/22 (!) 130/90  07/03/21 126/60  10/01/20 133/78    Lab Results  Component Value Date   HGBA1C 6.1 07/03/2021     Patient obtains medications through Adherence Packaging  90 Days   Last adherence delivery included:  Omeprazole (PRILOSEC) : 20 mg : Take one tab by mouth every other day Escitalopram (LEXAPRO) 10 mg : Take one tab a day Triamterene-hydrochlorothiazide (DYAZIDE) 375.-25 mg :Take one tab a day     Patient needs refills for Vit D 3 5000 units daily, patient  reports she usually gets from [facility] [place] but has run out ad wanted it added to her order, advised I would put on her order and have customer service discuss price with her prior to delivery and she was in agreement..    Patient is due for next adherence delivery on: 06/21/22 . Called patient and reviewed medications and coordinated delivery. Vials 90 DS w/safety cap  This delivery to include: Omeprazole (PRILOSEC) : 20 mg : Take one tab by mouth every other day Escitalopram (LEXAPRO) 10 mg : Take one tab a day Triamterene-hydrochlorothiazide (DYAZIDE) 375.-25 mg :Take one tab a day Vitamin D 5000 u : Take one tab daily    Unable to reach to confirm delivery date of 06/21/22, advised patient that pharmacy will contact them the morning of delivery.   Care Gaps: AWV - Done 01/18/22 Zoster Vaccine - Overdue COVID Booster - Overdue Flu Vaccine - Overdue Colonoscopy - Overdue BP- 130/90 04/20/22  Star Rating Drugs: None    [name] Clinical Pharmacist Assistant [phone]

## 2022-06-14 ENCOUNTER — Other Ambulatory Visit: Payer: Self-pay | Admitting: Adult Health

## 2022-06-14 DIAGNOSIS — F419 Anxiety disorder, unspecified: Secondary | ICD-10-CM

## 2022-06-14 DIAGNOSIS — F32A Depression, unspecified: Secondary | ICD-10-CM

## 2022-06-15 ENCOUNTER — Encounter: Payer: Self-pay | Admitting: Gastroenterology

## 2022-06-16 ENCOUNTER — Other Ambulatory Visit: Payer: Self-pay | Admitting: Adult Health

## 2022-06-16 DIAGNOSIS — F32A Depression, unspecified: Secondary | ICD-10-CM

## 2022-06-16 DIAGNOSIS — F419 Anxiety disorder, unspecified: Secondary | ICD-10-CM

## 2022-06-16 NOTE — Telephone Encounter (Signed)
Patient need to schedule a CPE for more refills. 

## 2022-06-16 NOTE — Telephone Encounter (Signed)
Disregard previous message 

## 2022-06-19 ENCOUNTER — Encounter: Payer: Self-pay | Admitting: Certified Registered Nurse Anesthetist

## 2022-06-23 ENCOUNTER — Encounter: Payer: Self-pay | Admitting: Gastroenterology

## 2022-06-23 ENCOUNTER — Ambulatory Visit (AMBULATORY_SURGERY_CENTER): Payer: PPO | Admitting: Gastroenterology

## 2022-06-23 VITALS — BP 108/51 | HR 62 | Temp 98.8°F | Resp 11 | Ht 64.0 in | Wt 173.0 lb

## 2022-06-23 DIAGNOSIS — G4733 Obstructive sleep apnea (adult) (pediatric): Secondary | ICD-10-CM | POA: Diagnosis not present

## 2022-06-23 DIAGNOSIS — K635 Polyp of colon: Secondary | ICD-10-CM

## 2022-06-23 DIAGNOSIS — D122 Benign neoplasm of ascending colon: Secondary | ICD-10-CM

## 2022-06-23 DIAGNOSIS — Z09 Encounter for follow-up examination after completed treatment for conditions other than malignant neoplasm: Secondary | ICD-10-CM | POA: Diagnosis not present

## 2022-06-23 DIAGNOSIS — Z86010 Personal history of colonic polyps: Secondary | ICD-10-CM | POA: Diagnosis not present

## 2022-06-23 DIAGNOSIS — K219 Gastro-esophageal reflux disease without esophagitis: Secondary | ICD-10-CM | POA: Diagnosis not present

## 2022-06-23 MED ORDER — SODIUM CHLORIDE 0.9 % IV SOLN: 500.0000 mL | Freq: Once | INTRAVENOUS | Status: DC

## 2022-06-23 NOTE — Patient Instructions (Addendum)
Resume previous diet. - Continue present medications. - Await pathology results. - Repeat colonoscopy in 3 years for surveillance based on pathology results.  Handouts on polyps, hemorrhoids and diverticulosis given.  YOU HAD AN ENDOSCOPIC PROCEDURE TODAY AT [facility]:   Refer to the procedure report that was given to you for any specific questions about what was found during the examination.  If the procedure report does not answer your questions, please call your gastroenterologist to clarify.  If you requested that your care partner not be given the details of your procedure findings, then the procedure report has been included in a sealed envelope for you to review at your convenience later.  YOU SHOULD EXPECT: Some feelings of bloating in the abdomen. Passage of more gas than usual.  Walking can help get rid of the air that was put into your GI tract during the procedure and reduce the bloating. If you had a lower endoscopy (such as a colonoscopy or flexible sigmoidoscopy) you may notice spotting of blood in your stool or on the toilet paper. If you underwent a bowel prep for your procedure, you may not have a normal bowel movement for a few days.  Please Note:  You might notice some irritation and congestion in your nose or some drainage.  This is from the oxygen used during your procedure.  There is no need for concern and it should clear up in a day or so.  SYMPTOMS TO REPORT IMMEDIATELY:  Following lower endoscopy (colonoscopy or flexible sigmoidoscopy):  Excessive amounts of blood in the stool  Significant tenderness or worsening of abdominal pains  Swelling of the abdomen that is new, acute  Fever of 100F or higher  For urgent or emergent issues, a gastroenterologist can be reached at any hour by calling [phone]. Do not use MyChart messaging for urgent concerns.    DIET:  We do recommend a small meal at first, but then you may proceed to your regular  diet.  Drink plenty of fluids but you should avoid alcoholic beverages for 24 hours.  ACTIVITY:  You should plan to take it easy for the rest of today and you should NOT DRIVE or use heavy machinery until tomorrow (because of the sedation medicines used during the test).    FOLLOW UP: Our staff will call the number listed on your records the next business day following your procedure.  We will call around 7:15- 8:00 am to check on you and address any questions or concerns that you may have regarding the information given to you following your procedure. If we do not reach you, we will leave a message.     If any biopsies were taken you will be contacted by phone or by letter within the next 1-3 weeks.  Please call us at [phone] if you have not heard about the biopsies in 3 weeks.    SIGNATURES/CONFIDENTIALITY: You and/or your care partner have signed paperwork which will be entered into your electronic medical record.  These signatures attest to the fact that that the information above on your After Visit Summary has been reviewed and is understood.  Full responsibility of the confidentiality of this discharge information lies with you and/or your care-partner.

## 2022-06-23 NOTE — Op Note (Signed)
[place] Patient Name: Krystal Monroe Procedure Date: 06/23/2022 1:18 PM MRN: [id number] Endoscopist: Mauri Pole , MD, [id number] Age: 71 Referring MD:  Date of Birth: 11/04/1950 Gender: Female Account #: [id number] Procedure:                Colonoscopy Indications:              High risk colon cancer surveillance: Personal                            history of colonic polyps, High risk colon cancer                            surveillance: Personal history of adenoma (10 mm or                            greater in size), High risk colon cancer                            surveillance: Personal history of multiple (3 or                            more) adenomas Medicines:                Monitored Anesthesia Care Procedure:                Pre-Anesthesia Assessment:                           - Prior to the procedure, a History and Physical                            was performed, and patient medications and                            allergies were reviewed. The patient's tolerance of                            previous anesthesia was also reviewed. The risks                            and benefits of the procedure and the sedation                            options and risks were discussed with the patient.                            All questions were answered, and informed consent                            was obtained. Prior Anticoagulants: The patient has                            taken no anticoagulant or antiplatelet agents. ASA  Grade Assessment: II - A patient with mild systemic                            disease. After reviewing the risks and benefits,                            the patient was deemed in satisfactory condition to                            undergo the procedure.                           After obtaining informed consent, the colonoscope                            was passed under direct vision. Throughout the                             procedure, the patient's blood pressure, pulse, and                            oxygen saturations were monitored continuously. The                            Olympus PCF-[id number]) Colonoscope was                            introduced through the anus and advanced to the the                            cecum, identified by appendiceal orifice and                            ileocecal valve. The colonoscopy was performed                            without difficulty. The patient tolerated the                            procedure well. The quality of the bowel                            preparation was good. The ileocecal valve,                            appendiceal orifice, and rectum were photographed. Scope In: 2:19:39 PM Scope Out: 2:35:08 PM Scope Withdrawal Time: 0 hours 8 minutes 34 seconds  Total Procedure Duration: 0 hours 15 minutes 29 seconds  Findings:                 The perianal and digital rectal examinations were                            normal.  An 11 mm polyp was found in the ascending colon.                            The polyp was sessile. The polyp was removed with a                            cold snare. Resection and retrieval were complete.                           Scattered large-mouthed diverticula were found in                            the sigmoid colon, descending colon, transverse                            colon and ascending colon.                           Non-bleeding external and internal hemorrhoids were                            found during retroflexion. The hemorrhoids were                            medium-sized. Complications:            No immediate complications. Estimated Blood Loss:     Estimated blood loss was minimal. Impression:               - One 11 mm polyp in the ascending colon, removed                            with a cold snare. Resected and retrieved.                            - Severe diverticulosis in the sigmoid colon, in                            the descending colon, in the transverse colon and                            in the ascending colon.                           - Non-bleeding external and internal hemorrhoids. Recommendation:           - Resume previous diet.                           - Continue present medications.                           - Await pathology results.                           - Repeat colonoscopy in 3 years for surveillance  based on pathology results. Mauri Pole, MD 06/23/2022 2:39:49 PM This report has been signed electronically.

## 2022-06-23 NOTE — Progress Notes (Unsigned)
VS by CW  Pt's states no medical or surgical changes since previsit or office visit.  

## 2022-06-23 NOTE — Progress Notes (Unsigned)
[facility] History and Physical   Primary Care Physician:  [name], NP   Reason for Procedure:  History of adenomatous colon polyps  Plan:    Surveillance colonoscopy with possible interventions as needed     HPI: Krystal Monroe is a very pleasant 71 y.o. female here for surveillance colonoscopy. Denies any nausea, vomiting, abdominal pain, melena or bright red blood per rectum  The risks and benefits as well as alternatives of endoscopic procedure(s) have been discussed and reviewed. All questions answered. The patient agrees to proceed.    Past Medical History:  Diagnosis Date   Anxiety    son killed in MVA   Asthma    as a child   Basal cell carcinoma of nose    Colon polyps    2011   Depression    son killed in MVA   Difficult airway for intubation, initial encounter 10/15/2020   light wand intubation   Diverticulosis of colon    diverticulitits   Gallstones    GERD (gastroesophageal reflux disease)    Hypertension    Obesity    Sleep apnea    mild per pt-no tx   Trigeminal neuralgia     Past Surgical History:  Procedure Laterality Date   ABDOMINAL HYSTERECTOMY     1997   CATARACT EXTRACTION Left 06/05/2020   per patient   [place]   x 3   CHOLECYSTECTOMY  2002   COLONOSCOPY  last 03/15/2016   EYE SURGERY     ROTATOR CUFF REPAIR Right 2017   TOTAL ABDOMINAL HYSTERECTOMY W/ BILATERAL SALPINGOOPHORECTOMY  2001   UPPER GASTROINTESTINAL ENDOSCOPY      Prior to Admission medications   Medication Sig Start Date End Date Taking? Authorizing Provider  cholecalciferol (VITAMIN D3) 25 MCG (1000 UNIT) tablet Take 5,000 Units by mouth daily.   Yes [provider]  escitalopram (LEXAPRO) 10 MG tablet TAKE ONE TABLET BY MOUTH ONCE DAILY 06/16/22  Yes Nafziger, Tommi Rumps, NP  omeprazole (PRILOSEC) 20 MG capsule TAKE ONE CAPSULE BY MOUTH EVERY OTHER DAY 03/09/22  Yes Nafziger, Tommi Rumps, NP  tretinoin (RETIN-A) 0.025 %  cream SMARTSIG:1 sparingly Topical Every Night 02/03/22  Yes [provider]  triamterene-hydrochlorothiazide (DYAZIDE) 37.5-25 MG capsule TAKE ONE CAPSULE BY MOUTH ONCE DAILY 06/08/22  Yes Nafziger, Tommi Rumps, NP  psyllium (METAMUCIL) 58.6 % powder Take 1 packet by mouth daily.    [provider]    Current Outpatient Medications  Medication Sig Dispense Refill   cholecalciferol (VITAMIN D3) 25 MCG (1000 UNIT) tablet Take 5,000 Units by mouth daily.     escitalopram (LEXAPRO) 10 MG tablet TAKE ONE TABLET BY MOUTH ONCE DAILY 30 tablet 0   omeprazole (PRILOSEC) 20 MG capsule TAKE ONE CAPSULE BY MOUTH EVERY OTHER DAY 90 capsule 2   tretinoin (RETIN-A) 0.025 % cream SMARTSIG:1 sparingly Topical Every Night     triamterene-hydrochlorothiazide (DYAZIDE) 37.5-25 MG capsule TAKE ONE CAPSULE BY MOUTH ONCE DAILY 90 capsule 3   psyllium (METAMUCIL) 58.6 % powder Take 1 packet by mouth daily.     Current Facility-Administered Medications  Medication Dose Route Frequency Provider Last Rate Last Admin   0.9 %  sodium chloride infusion  500 mL Intravenous Once Mauri Pole, MD        Allergies as of 06/23/2022 - Review Complete 06/23/2022  Allergen Reaction Noted   Iodine-131 Rash 09/11/2018   Other Hives, Rash, and Other (See Comments) 12/01/2012  Family History  Problem Relation Age of Onset   Diabetes Maternal Grandmother    Crohn's disease Father    Lung cancer Father    Breast cancer Mother    Osteoporosis Mother    Dementia Mother    Diabetes Mother    Hypertension Mother    Colon cancer Neg Hx    Esophageal cancer Neg Hx    Rectal cancer Neg Hx    Stomach cancer Neg Hx    Colon polyps Neg Hx     Social History   Socioeconomic History   Marital status: Married    Spouse name: Not on file   Number of children: 3   Years of education: BSN   Highest education level: Bachelor's degree (e.g., BA, AB, BS)  Occupational History   Occupation: Therapist, 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   Enjoys reading, needlework, painting            Social Determinants of Health   Financial Resource Strain: Low Risk  (04/19/2022)   Overall Financial Resource Strain (CARDIA)    Difficulty of Paying Living Expenses: Not hard at all  Food Insecurity: No Food Insecurity (04/19/2022)   Hunger Vital Sign    Worried About Running Out of Food in the Last Year: Never true    [place] in the Last Year: Never true  Transportation Needs: No Transportation Needs (04/19/2022)   PRAPARE - Hydrologist (Medical): No    Lack of Transportation (Non-Medical): No  Physical Activity: Insufficiently Active (04/19/2022)   Exercise Vital Sign    Days of Exercise per Week: 3 days    Minutes of Exercise per Session: 30 min  Stress: No Stress Concern Present (04/19/2022)   [place]    Feeling of Stress : Not at all  Social Connections: Moderately Integrated (04/19/2022)   Social Connection and Isolation Panel [NHANES]    Frequency of Communication with Friends and Family: More than three times a week    Frequency of Social Gatherings with Friends and Family: Three times a week    Attends Religious Services: Never    Active Member of Clubs or Organizations: Yes     Attends Music therapist: More than 4 times per year    Marital Status: Married  Human resources officer Violence: Not on file    Review of Systems:  All other review of systems negative except as mentioned in the HPI.  Physical Exam: Vital signs in last 24 hours: Blood Pressure 134/69   Pulse 74   Temperature 98.8 F (37.1 C)   Height '5\' 4"'$  (1.626 m)  Weight 173 lb (78.5 kg)   Oxygen Saturation 96%   Body Mass Index 29.70 kg/m  General:   Alert, NAD Lungs:  Clear .   Heart:  Regular rate and rhythm Abdomen:  Soft, nontender and nondistended. Neuro/Psych:  Alert and cooperative. Normal mood and affect. A and O x 3  Reviewed labs, radiology imaging, old records and pertinent past GI work up  Patient is appropriate for planned procedure(s) and anesthesia in an ambulatory setting   K. Denzil Magnuson , MD [phone]

## 2022-06-23 NOTE — Progress Notes (Signed)
Called to room to assist during endoscopic procedure.  Patient ID and intended procedure confirmed with present staff. Received instructions for my participation in the procedure from the performing physician.  

## 2022-06-24 ENCOUNTER — Telehealth: Payer: Self-pay | Admitting: *Deleted

## 2022-06-24 ENCOUNTER — Encounter: Payer: Self-pay | Admitting: Gastroenterology

## 2022-06-24 NOTE — Telephone Encounter (Signed)
  Follow up Call-     06/23/2022    1:04 PM  Call back number  Post procedure Call Back phone  # [phone]  Permission to leave phone message Yes     Patient questions:   Message left to call if necessary.

## 2022-07-06 ENCOUNTER — Ambulatory Visit (INDEPENDENT_AMBULATORY_CARE_PROVIDER_SITE_OTHER): Payer: PPO | Admitting: Adult Health

## 2022-07-06 ENCOUNTER — Encounter: Payer: Self-pay | Admitting: Adult Health

## 2022-07-06 ENCOUNTER — Telehealth: Payer: Self-pay | Admitting: Adult Health

## 2022-07-06 VITALS — BP 110/64 | HR 75 | Temp 98.2°F | Ht 64.0 in | Wt 170.0 lb

## 2022-07-06 DIAGNOSIS — Z0000 Encounter for general adult medical examination without abnormal findings: Secondary | ICD-10-CM

## 2022-07-06 DIAGNOSIS — I10 Essential (primary) hypertension: Secondary | ICD-10-CM

## 2022-07-06 DIAGNOSIS — E782 Mixed hyperlipidemia: Secondary | ICD-10-CM

## 2022-07-06 DIAGNOSIS — F32A Depression, unspecified: Secondary | ICD-10-CM | POA: Diagnosis not present

## 2022-07-06 DIAGNOSIS — F419 Anxiety disorder, unspecified: Secondary | ICD-10-CM

## 2022-07-06 DIAGNOSIS — E785 Hyperlipidemia, unspecified: Secondary | ICD-10-CM

## 2022-07-06 DIAGNOSIS — E559 Vitamin D deficiency, unspecified: Secondary | ICD-10-CM

## 2022-07-06 DIAGNOSIS — E7439 Other disorders of intestinal carbohydrate absorption: Secondary | ICD-10-CM | POA: Diagnosis not present

## 2022-07-06 DIAGNOSIS — Z23 Encounter for immunization: Secondary | ICD-10-CM

## 2022-07-06 DIAGNOSIS — K2100 Gastro-esophageal reflux disease with esophagitis, without bleeding: Secondary | ICD-10-CM

## 2022-07-06 LAB — TSH: TSH: 1.22 u[IU]/mL (ref 0.35–5.50)

## 2022-07-06 LAB — COMPREHENSIVE METABOLIC PANEL
ALT: 12 U/L (ref 0–35)
AST: 15 U/L (ref 0–37)
Albumin: 4.6 g/dL (ref 3.5–5.2)
Alkaline Phosphatase: 47 U/L (ref 39–117)
BUN: 14 mg/dL (ref 6–23)
CO2: 27 mEq/L (ref 19–32)
Calcium: 9.6 mg/dL (ref 8.4–10.5)
Chloride: 102 mEq/L (ref 96–112)
Creatinine, Ser: 0.83 mg/dL (ref 0.40–1.20)
GFR: 70.18 mL/min (ref >60.00)
Glucose, Bld: 112 mg/dL — ABNORMAL HIGH (ref 70–99)
Potassium: 3.5 mEq/L (ref 3.5–5.1)
Sodium: 138 mEq/L (ref 135–145)
Total Bilirubin: 0.7 mg/dL (ref 0.2–1.2)
Total Protein: 7.3 g/dL (ref 6.0–8.3)

## 2022-07-06 LAB — CBC WITH DIFFERENTIAL/PLATELET
Basophils Absolute: 0 10*3/uL (ref 0.0–0.1)
Basophils Relative: 0.3 % (ref 0.0–3.0)
Eosinophils Absolute: 0.1 10*3/uL (ref 0.0–0.7)
Eosinophils Relative: 1.6 % (ref 0.0–5.0)
HCT: 39.8 % (ref 36.0–46.0)
Hemoglobin: 13.5 g/dL (ref 12.0–15.0)
Lymphocytes Relative: 21.7 % (ref 12.0–46.0)
Lymphs Abs: 1.6 10*3/uL (ref 0.7–4.0)
MCHC: 33.8 g/dL (ref 30.0–36.0)
MCV: 85.6 fl (ref 78.0–100.0)
Monocytes Absolute: 0.4 10*3/uL (ref 0.1–1.0)
Monocytes Relative: 6.1 % (ref 3.0–12.0)
Neutro Abs: 5.1 10*3/uL (ref 1.4–7.7)
Neutrophils Relative %: 70.3 % (ref 43.0–77.0)
Platelets: 242 10*3/uL (ref 150.0–400.0)
RBC: 4.64 Mil/uL (ref 3.87–5.11)
RDW: 13.7 % (ref 11.5–15.5)
WBC: 7.2 10*3/uL (ref 4.0–10.5)

## 2022-07-06 LAB — LIPID PANEL
Cholesterol: 184 mg/dL (ref 0–200)
HDL: 42.3 mg/dL (ref >39.00)
NonHDL: 141.87
Total CHOL/HDL Ratio: 4
Triglycerides: 217 mg/dL — ABNORMAL HIGH (ref 0.0–149.0)
VLDL: 43.4 mg/dL — ABNORMAL HIGH (ref 0.0–40.0)

## 2022-07-06 LAB — VITAMIN D 25 HYDROXY (VIT D DEFICIENCY, FRACTURES): VITD: 46.58 ng/mL (ref 30.00–100.00)

## 2022-07-06 LAB — LDL CHOLESTEROL, DIRECT: Direct LDL: 112 mg/dL

## 2022-07-06 LAB — HEMOGLOBIN A1C: Hgb A1c MFr Bld: 6.3 % (ref 4.6–6.5)

## 2022-07-06 MED ORDER — ESCITALOPRAM OXALATE 10 MG PO TABS: 10.0000 mg | ORAL_TABLET | Freq: Every day | ORAL | 1 refills | Status: DC

## 2022-07-06 MED ORDER — ATORVASTATIN CALCIUM 10 MG PO TABS: 10.0000 mg | ORAL_TABLET | Freq: Every day | ORAL | 3 refills | Status: DC

## 2022-07-06 NOTE — Telephone Encounter (Signed)
Updated patient on her labs.   Cholesterol panel continues to be elevated. She is ok with starting lipitor   Her A1c also increased to 6.3 - we discussed starting metformin and/or working on lifestyle modifications.   She would like to work on lifestyle modifications and will follow up in 6 months

## 2022-07-06 NOTE — Progress Notes (Signed)
Subjective:    Patient ID: Krystal Monroe, female    DOB: 10-02-1950, 71 y.o.   MRN: [id number]  HPI Patient presents for yearly preventative medicine examination. She is a pleasant 71 year old female who  has a past medical history of Anxiety, Asthma, Basal cell carcinoma of nose, Colon polyps, Depression, Difficult airway for intubation, initial encounter (10/15/2020), Diverticulosis of colon, Gallstones, GERD (gastroesophageal reflux disease), Hypertension, Obesity, Sleep apnea, and Trigeminal neuralgia.  Hypertension-managed with Dyazide 37.5-25 mg daily.  She does monitor her blood pressure at home and currently gets readings in the 017B to 939Q systolic.  She denies dizziness, lightheadedness, chest pain, or shortness of breath BP Readings from Last 3 Encounters:  07/06/22 110/64  06/23/22 (!) 108/51  04/20/22 (!) 130/90   GERD-managed with Prilosec 20 mg daily.  Her symptoms are well controlled  Depression-controlled with Lexapro 10 mg daily.  She does not have any depressive symptoms currently  History of trigeminal neuralgia-has had microvascular decompression.  She is feeling well.  No longer on Lamictal  Vitamin D deficiency-takes 5000 units daily   Glucose intolerance- not currently on medication  Lab Results  Component Value Date   HGBA1C 6.1 07/03/2021   Hyperlipidemia - not currently on medication. Wanted to work on lifestyle modifications.  Lab Results  Component Value Date   CHOL 187 07/03/2021   HDL 42.80 07/03/2021   LDLCALC 106 (H) 07/04/2020   LDLDIRECT 103.0 07/03/2021   TRIG 238.0 (H) 07/03/2021   CHOLHDL 4 07/03/2021    All immunizations and health maintenance protocols were reviewed with the patient and needed orders were placed.  Appropriate screening laboratory values were ordered for the patient including screening of hyperlipidemia, renal function and hepatic function.  Medication reconciliation,  past medical history, social history, problem  list and allergies were reviewed in detail with the patient  Goals were established with regard to weight loss, exercise, and  diet in compliance with medications Wt Readings from Last 3 Encounters:  07/06/22 170 lb (77.1 kg)  06/23/22 173 lb (78.5 kg)  05/26/22 173 lb (78.5 kg)   She is up to date on routine colon cancer screening   Review of Systems  Constitutional: Negative.   HENT: Negative.    Eyes: Negative.   Respiratory: Negative.    Cardiovascular: Negative.   Gastrointestinal: Negative.   Endocrine: Negative.   Genitourinary: Negative.   Musculoskeletal: Negative.   Skin: Negative.   Allergic/Immunologic: Negative.   Neurological: Negative.   Hematological: Negative.   Psychiatric/Behavioral: Negative.     Past Medical History:  Diagnosis Date   Anxiety    son killed in MVA   Asthma    as a child   Basal cell carcinoma of nose    Colon polyps    2011   Depression    son killed in MVA   Difficult airway for intubation, initial encounter 10/15/2020   light wand intubation   Diverticulosis of colon    diverticulitits   Gallstones    GERD (gastroesophageal reflux disease)    Hypertension    Obesity    Sleep apnea    mild per pt-no tx   Trigeminal neuralgia     Social History   Socioeconomic History   Marital status: Married    Spouse name: Not on file   Number of children: 3   Years of education: BSN   Highest education level: Bachelor's degree (e.g., BA, AB, BS)  Occupational History   Occupation: Therapist, 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   Enjoys reading, needlework, painting            Social Determinants of Health   Financial Resource Strain: Low Risk  (04/19/2022)   Overall Financial Resource Strain (CARDIA)    Difficulty of Paying Living Expenses: Not hard at all  Food Insecurity: No Food Insecurity (04/19/2022)   Hunger Vital Sign    Worried About Running Out of Food in the Last Year: Never true    [place] in the Last Year: Never true  Transportation Needs: No Transportation Needs (04/19/2022)   PRAPARE - Hydrologist (Medical): No    Lack of Transportation (Non-Medical): No  Physical Activity: Insufficiently Active (04/19/2022)   Exercise Vital Sign    Days of Exercise per Week: 3 days    Minutes of Exercise per Session: 30 min  Stress: No Stress Concern Present (04/19/2022)   [place]    Feeling of Stress : Not at all  Social Connections: Moderately Integrated (04/19/2022)   Social Connection and Isolation Panel [NHANES]    Frequency of Communication with Friends and Family: More than three times a week    Frequency of Social Gatherings with Friends and Family: Three times a week    Attends Religious Services: Never    Active Member of Clubs or Organizations: Yes    Attends Music therapist: More than 4 times per year    Marital Status: Married  Human resources officer Violence: Not on file    Past Surgical History:  Procedure Laterality Date   ABDOMINAL HYSTERECTOMY     1997   CATARACT EXTRACTION Left 06/05/2020   per patient   [place], 1982, 1984   x 3   CHOLECYSTECTOMY  2002   COLONOSCOPY  last 03/15/2016   EYE SURGERY     ROTATOR CUFF REPAIR Right 2017    TOTAL ABDOMINAL HYSTERECTOMY W/ BILATERAL SALPINGOOPHORECTOMY  2001   UPPER GASTROINTESTINAL ENDOSCOPY      Family History  Problem Relation Age of Onset   Diabetes Maternal Grandmother    Crohn's disease Father    Lung cancer Father    Breast cancer Mother    Osteoporosis Mother    Dementia Mother    Diabetes Mother    Hypertension Mother    Colon cancer Neg Hx    Esophageal cancer Neg Hx    Rectal cancer Neg Hx    Stomach cancer Neg Hx    Colon polyps Neg Hx     Allergies  Allergen Reactions   Iodine-131 Rash   Other Hives, Rash and Other (  See Comments)    Magnavist Contrast    Current Outpatient Medications on File Prior to Visit  Medication Sig Dispense Refill   cholecalciferol (VITAMIN D3) 25 MCG (1000 UNIT) tablet Take 5,000 Units by mouth daily.     escitalopram (LEXAPRO) 10 MG tablet TAKE ONE TABLET BY MOUTH ONCE DAILY 30 tablet 0   omeprazole (PRILOSEC) 20 MG capsule TAKE ONE CAPSULE BY MOUTH EVERY OTHER DAY 90 capsule 2   psyllium (METAMUCIL) 58.6 % powder Take 1 packet by mouth daily.     tretinoin (RETIN-A) 0.025 % cream SMARTSIG:1 sparingly Topical Every Night     triamterene-hydrochlorothiazide (DYAZIDE) 37.5-25 MG capsule TAKE ONE CAPSULE BY MOUTH ONCE DAILY 90 capsule 3   No current facility-administered medications on file prior to visit.    BP 110/64   Pulse 75   Temp 98.2 F (36.8 C) (Oral)   Ht '5\' 4"'$  (1.626 m)   Wt 170 lb (77.1 kg)   SpO2 98%   BMI 29.18 kg/m       Objective:   Physical Exam Vitals and nursing note reviewed.  Constitutional:      General: She is not in acute distress.    Appearance: Normal appearance. She is well-developed. She is not ill-appearing.  HENT:     Head: Normocephalic and atraumatic.     Right Ear: Tympanic membrane, ear canal and external ear normal. There is no impacted cerumen.     Left Ear: Tympanic membrane, ear canal and external ear normal. There is no impacted cerumen.     Nose: Nose normal. No  congestion or rhinorrhea.     Mouth/Throat:     Mouth: Mucous membranes are moist.     Pharynx: Oropharynx is clear. No oropharyngeal exudate or posterior oropharyngeal erythema.  Eyes:     General:        Right eye: No discharge.        Left eye: No discharge.     Extraocular Movements: Extraocular movements intact.     Conjunctiva/sclera: Conjunctivae normal.     Pupils: Pupils are equal, round, and reactive to light.  Neck:     Thyroid: No thyromegaly.     Vascular: No carotid bruit.     Trachea: No tracheal deviation.  Cardiovascular:     Rate and Rhythm: Normal rate and regular rhythm.     Pulses: Normal pulses.     Heart sounds: Normal heart sounds. No murmur heard.    No friction rub. No gallop.  Pulmonary:     Effort: Pulmonary effort is normal. No respiratory distress.     Breath sounds: Normal breath sounds. No stridor. No wheezing, rhonchi or rales.  Chest:     Chest wall: No tenderness.  Abdominal:     General: Abdomen is flat. Bowel sounds are normal. There is no distension.     Palpations: Abdomen is soft. There is no mass.     Tenderness: There is no abdominal tenderness. There is no right CVA tenderness, left CVA tenderness, guarding or rebound.     Hernia: No hernia is present.  Musculoskeletal:        General: No swelling, tenderness, deformity or signs of injury. Normal range of motion.     Cervical back: Normal range of motion and neck supple.     Right lower leg: No edema.     Left lower leg: No edema.  Lymphadenopathy:     Cervical: No cervical adenopathy.  Skin:    General: Skin  is warm and dry.     Coloration: Skin is not jaundiced or pale.     Findings: No bruising, erythema, lesion or rash.  Neurological:     General: No focal deficit present.     Mental Status: She is alert and oriented to person, place, and time.     Cranial Nerves: No cranial nerve deficit.     Sensory: No sensory deficit.     Motor: No weakness.     Coordination:  Coordination normal.     Gait: Gait normal.     Deep Tendon Reflexes: Reflexes normal.  Psychiatric:        Mood and Affect: Mood normal.        Behavior: Behavior normal.        Thought Content: Thought content normal.        Judgment: Judgment normal.       Assessment & Plan:  1. Routine general medical examination at a health care facility - Follow up in one year or sooner if needed - CBC with Differential/Platelet; Future - Comprehensive metabolic panel; Future - Hemoglobin A1c; Future - Lipid panel; Future - TSH; Future  2. Essential hypertension - Well controlled. No change in medications  - CBC with Differential/Platelet; Future - Comprehensive metabolic panel; Future - Hemoglobin A1c; Future - Lipid panel; Future - TSH; Future  3. Gastroesophageal reflux disease with esophagitis without hemorrhage - Continue Prilosec 20 mg   4. Depression, unspecified depression type - Controlled.  - escitalopram (LEXAPRO) 10 MG tablet; Take 1 tablet (10 mg total) by mouth daily.  Dispense: 90 tablet; Refill: 1  5. Vitamin D deficiency  - VITAMIN D 25 Hydroxy (Vit-D Deficiency, Fractures); Future  6. Glucose intolerance - Consider metformin - CBC with Differential/Platelet; Future - Comprehensive metabolic panel; Future - Hemoglobin A1c; Future - Lipid panel; Future - TSH; Future  7. Anxiety  - escitalopram (LEXAPRO) 10 MG tablet; Take 1 tablet (10 mg total) by mouth daily.  Dispense: 90 tablet; Refill: 1  8. Hyperlipidemia, unspecified hyperlipidemia type - Consider statin  - CBC with Differential/Platelet; Future - Comprehensive metabolic panel; Future - Hemoglobin A1c; Future - Lipid panel; Future - TSH; Future  9. Need for immunization against influenza  - Flu Vaccine QUAD High Dose(Fluad)    [name], NP

## 2022-07-06 NOTE — Patient Instructions (Signed)
It was great seeing you today   We will follow up with you regarding your lab work   Please let me know if you need anything   

## 2022-07-12 ENCOUNTER — Encounter: Payer: Self-pay | Admitting: Gastroenterology

## 2022-11-25 ENCOUNTER — Telehealth: Payer: Self-pay | Admitting: Adult Health

## 2022-11-25 NOTE — Telephone Encounter (Signed)
Contacted Krystal Monroe to schedule their annual wellness visit. Appointment made for 11/26/22.  [name] AWV direct phone # [phone]

## 2022-11-26 ENCOUNTER — Ambulatory Visit (INDEPENDENT_AMBULATORY_CARE_PROVIDER_SITE_OTHER): Payer: PPO

## 2022-11-26 VITALS — Ht 64.0 in | Wt 163.0 lb

## 2022-11-26 DIAGNOSIS — Z0000 Encounter for general adult medical examination without abnormal findings: Secondary | ICD-10-CM

## 2022-11-26 NOTE — Patient Instructions (Addendum)
Krystal Monroe , Thank you for taking time to come for your Medicare Wellness Visit. I appreciate your ongoing commitment to your health goals. Please review the following plan we discussed and let me know if I can assist you in the future.   These are the goals we discussed:  Goals       Exercise 150 minutes per week (moderate activity)      Will go to the gym 3 days a week; with weight bearing exercise Cardio Will explore how to meet those goals       Increase physical activity (pt-stated)      Patient Stated      Continue to remain pain-free, manage chronic conditions Start going to gym with husband      Patient Stated      01/18/2022, wants to weigh 165 pounds by the time she sees [place] in November      Weight (lb) < 165 lb (74.8 kg)      Will be prepared when going grocery shopping Will plan meals at least 2 weeks of meals Maybe "go - to " meals           This is a list of the screening recommended for you and due dates:  Health Maintenance  Topic Date Due   DTaP/Tdap/Td vaccine (3 - Tdap) 10/11/2018   Zoster (Shingles) Vaccine (1 of 2) 02/25/2023*   COVID-19 Vaccine (4 - 2023-24 season) 08/04/2023*   Mammogram  01/19/2023   Flu Shot  03/17/2023   Medicare Annual Wellness Visit  11/26/2023   Colon Cancer Screening  06/23/2025   Pneumonia Vaccine  Completed   DEXA scan (bone density measurement)  Completed   Hepatitis C Screening: USPSTF Recommendation to screen - Ages 61-79 yo.  Completed   HPV Vaccine  Aged Out  *Topic was postponed. The date shown is not the original due date.    Advanced directives: Please bring a copy of your health care power of attorney and living will to the office to be added to your chart at your convenience.   Conditions/risks identified: None  Next appointment: Follow up in one year for your annual wellness visit    Preventive Care 65 Years and Older, Female Preventive care refers to lifestyle choices and visits with your health care  provider that can promote health and wellness. What does preventive care include? A yearly physical exam. This is also called an annual well check. Dental exams once or twice a year. Routine eye exams. Ask your health care provider how often you should have your eyes checked. Personal lifestyle choices, including: Daily care of your teeth and gums. Regular physical activity. Eating a healthy diet. Avoiding tobacco and drug use. Limiting alcohol use. Practicing safe sex. Taking low-dose aspirin every day. Taking vitamin and mineral supplements as recommended by your health care provider. What happens during an annual well check? The services and screenings done by your health care provider during your annual well check will depend on your age, overall health, lifestyle risk factors, and family history of disease. Counseling  Your health care provider may ask you questions about your: Alcohol use. Tobacco use. Drug use. Emotional well-being. Home and relationship well-being. Sexual activity. Eating habits. History of falls. Memory and ability to understand (cognition). Work and work Astronomer. Reproductive health. Screening  You may have the following tests or measurements: Height, weight, and BMI. Blood pressure. Lipid and cholesterol levels. These may be checked every 5 years, or more frequently if  you are over 20 years old. Skin check. Lung cancer screening. You may have this screening every year starting at age 61 if you have a 30-pack-year history of smoking and currently smoke or have quit within the past 15 years. Fecal occult blood test (FOBT) of the stool. You may have this test every year starting at age 65. Flexible sigmoidoscopy or colonoscopy. You may have a sigmoidoscopy every 5 years or a colonoscopy every 10 years starting at age 60. Hepatitis C blood test. Hepatitis B blood test. Sexually transmitted disease (STD) testing. Diabetes screening. This is done by  checking your blood sugar (glucose) after you have not eaten for a while (fasting). You may have this done every 1-3 years. Bone density scan. This is done to screen for osteoporosis. You may have this done starting at age 79. Mammogram. This may be done every 1-2 years. Talk to your health care provider about how often you should have regular mammograms. Talk with your health care provider about your test results, treatment options, and if necessary, the need for more tests. Vaccines  Your health care provider may recommend certain vaccines, such as: Influenza vaccine. This is recommended every year. Tetanus, diphtheria, and acellular pertussis (Tdap, Td) vaccine. You may need a Td booster every 10 years. Zoster vaccine. You may need this after age 3. Pneumococcal 13-valent conjugate (PCV13) vaccine. One dose is recommended after age 38. Pneumococcal polysaccharide (PPSV23) vaccine. One dose is recommended after age 62. Talk to your health care provider about which screenings and vaccines you need and how often you need them. This information is not intended to replace advice given to you by your health care provider. Make sure you discuss any questions you have with your health care provider. Document Released: 08/29/2015 Document Revised: 04/21/2016 Document Reviewed: 06/03/2015 Elsevier Interactive Patient Education  2017 [employer].  Fall Prevention in the Home Falls can cause injuries. They can happen to people of all ages. There are many things you can do to make your home safe and to help prevent falls. What can I do on the outside of my home? Regularly fix the edges of walkways and driveways and fix any cracks. Remove anything that might make you trip as you walk through a door, such as a raised step or threshold. Trim any bushes or trees on the path to your home. Use bright outdoor lighting. Clear any walking paths of anything that might make someone trip, such as rocks or  tools. Regularly check to see if handrails are loose or broken. Make sure that both sides of any steps have handrails. Any raised decks and porches should have guardrails on the edges. Have any leaves, snow, or ice cleared regularly. Use sand or salt on walking paths during winter. Clean up any spills in your garage right away. This includes oil or grease spills. What can I do in the bathroom? Use night lights. Install grab bars by the toilet and in the tub and shower. Do not use towel bars as grab bars. Use non-skid mats or decals in the tub or shower. If you need to sit down in the shower, use a plastic, non-slip stool. Keep the floor dry. Clean up any water that spills on the floor as soon as it happens. Remove soap buildup in the tub or shower regularly. Attach bath mats securely with double-sided non-slip rug tape. Do not have throw rugs and other things on the floor that can make you trip. What can I do  in the bedroom? Use night lights. Make sure that you have a light by your bed that is easy to reach. Do not use any sheets or blankets that are too big for your bed. They should not hang down onto the floor. Have a firm chair that has side arms. You can use this for support while you get dressed. Do not have throw rugs and other things on the floor that can make you trip. What can I do in the kitchen? Clean up any spills right away. Avoid walking on wet floors. Keep items that you use a lot in easy-to-reach places. If you need to reach something above you, use a strong step stool that has a grab bar. Keep electrical cords out of the way. Do not use floor polish or wax that makes floors slippery. If you must use wax, use non-skid floor wax. Do not have throw rugs and other things on the floor that can make you trip. What can I do with my stairs? Do not leave any items on the stairs. Make sure that there are handrails on both sides of the stairs and use them. Fix handrails that are  broken or loose. Make sure that handrails are as long as the stairways. Check any carpeting to make sure that it is firmly attached to the stairs. Fix any carpet that is loose or worn. Avoid having throw rugs at the top or bottom of the stairs. If you do have throw rugs, attach them to the floor with carpet tape. Make sure that you have a light switch at the top of the stairs and the bottom of the stairs. If you do not have them, ask someone to add them for you. What else can I do to help prevent falls? Wear shoes that: Do not have high heels. Have rubber bottoms. Are comfortable and fit you well. Are closed at the toe. Do not wear sandals. If you use a stepladder: Make sure that it is fully opened. Do not climb a closed stepladder. Make sure that both sides of the stepladder are locked into place. Ask someone to hold it for you, if possible. Clearly mark and make sure that you can see: Any grab bars or handrails. First and last steps. Where the edge of each step is. Use tools that help you move around (mobility aids) if they are needed. These include: Canes. Walkers. Scooters. Crutches. Turn on the lights when you go into a dark area. Replace any light bulbs as soon as they burn out. Set up your furniture so you have a clear path. Avoid moving your furniture around. If any of your floors are uneven, fix them. If there are any pets around you, be aware of where they are. Review your medicines with your doctor. Some medicines can make you feel dizzy. This can increase your chance of falling. Ask your doctor what other things that you can do to help prevent falls. This information is not intended to replace advice given to you by your health care provider. Make sure you discuss any questions you have with your health care provider. Document Released: 05/29/2009 Document Revised: 01/08/2016 Document Reviewed: 09/06/2014 Elsevier Interactive Patient Education  2017 [employer].

## 2022-11-26 NOTE — Progress Notes (Signed)
Subjective:   Krystal Monroe is a 72 y.o. female who presents for Medicare Annual (Subsequent) preventive examination.  Review of Systems    Virtual Visit via Telephone Note  I connected with  Krystal Monroe on 11/26/22 at 10:15 AM EDT by telephone and verified that I am speaking with the correct person using two identifiers.  Location: Patient: Home Provider: Office Persons participating in the virtual visit: patient/Nurse Health Advisor   I discussed the limitations, risks, security and privacy concerns of performing an evaluation and management service by telephone and the availability of in person appointments. The patient expressed understanding and agreed to proceed.  Interactive audio and video telecommunications were attempted between this nurse and patient, however failed, due to patient having technical difficulties OR patient did not have access to video capability.  We continued and completed visit with audio only.  Some vital signs may be absent or patient reported.   Tillie Rung, LPN  Cardiac Risk Factors include: advanced age (>46men, >66 women);hypertension     Objective:    Today's Vitals   11/26/22 1017  Weight: 163 lb (73.9 kg)  Height: 5\' 4"  (1.626 m)   Body mass index is 27.98 kg/m.     11/26/2022   10:26 AM 01/18/2022   10:35 AM 01/14/2021    2:51 PM 10/19/2018   10:29 AM 01/17/2017   10:28 AM 12/28/2016   10:55 AM 05/17/2016    2:23 PM  Advanced Directives  Does Patient Have a Medical Advance Directive? Yes Yes Yes Yes No No No  Type of Estate agent of [place];Living will Healthcare Power of [place];Living will Healthcare Power of [place];Living will Healthcare Power of [place];Living will     Does patient want to make changes to medical advance directive?    No - Patient declined     Copy of Healthcare Power of Attorney in Chart? No - copy requested No - copy requested No - copy requested No - copy requested     Would  patient like information on creating a medical advance directive?     No - Patient declined      Current Medications (verified) Outpatient Encounter Medications as of 11/26/2022  Medication Sig   atorvastatin (LIPITOR) 10 MG tablet Take 1 tablet (10 mg total) by mouth daily.   cholecalciferol (VITAMIN D3) 25 MCG (1000 UNIT) tablet Take 5,000 Units by mouth daily.   escitalopram (LEXAPRO) 10 MG tablet Take 1 tablet (10 mg total) by mouth daily.   omeprazole (PRILOSEC) 20 MG capsule TAKE ONE CAPSULE BY MOUTH EVERY OTHER DAY (Patient taking differently: Take 20 mg by mouth daily.)   psyllium (METAMUCIL) 58.6 % powder Take 1 packet by mouth daily.   tretinoin (RETIN-A) 0.025 % cream SMARTSIG:1 sparingly Topical Every Night   triamterene-hydrochlorothiazide (DYAZIDE) 37.5-25 MG capsule TAKE ONE CAPSULE BY MOUTH ONCE DAILY   No facility-administered encounter medications on file as of 11/26/2022.    Allergies (verified) Iodine-131 and Other   History: Past Medical History:  Diagnosis Date   Anxiety    son killed in MVA   Asthma    as a child   Basal cell carcinoma of nose    Colon polyps    2011   Depression    son killed in MVA   Difficult airway for intubation, initial encounter 10/15/2020   light wand intubation   Diverticulosis of colon    diverticulitits   Gallstones    GERD (gastroesophageal reflux disease)  Hypertension    Obesity    Sleep apnea    mild per pt-no tx   Trigeminal neuralgia    Past Surgical History:  Procedure Laterality Date   ABDOMINAL HYSTERECTOMY     1997   CATARACT EXTRACTION Left 06/05/2020   per patient   CESAREAN SECTION  1976, 1982, 1984   x 3   CHOLECYSTECTOMY  2002   COLONOSCOPY  last 03/15/2016   EYE SURGERY     ROTATOR CUFF REPAIR Right 2017   TOTAL ABDOMINAL HYSTERECTOMY W/ BILATERAL SALPINGOOPHORECTOMY  2001   UPPER GASTROINTESTINAL ENDOSCOPY     Family History  Problem Relation Age of Onset   Diabetes Maternal Grandmother     Crohn's disease Father    Lung cancer Father    Breast cancer Mother    Osteoporosis Mother    Dementia Mother    Diabetes Mother    Hypertension Mother    Colon cancer Neg Hx    Esophageal cancer Neg Hx    Rectal cancer Neg Hx    Stomach cancer Neg Hx    Colon polyps Neg Hx    Social History   Socioeconomic History   Marital status: Married    Spouse name: Not on file   Number of children: 3   Years of education: BSN   Highest education level: Bachelor's degree (e.g., BA, AB, BS)  Occupational History   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   Enjoys reading, needlework, painting            Social Determinants of Health   Financial Resource Strain: Low Risk  (11/26/2022)   Overall Financial Resource Strain (CARDIA)    Difficulty of Paying Living Expenses: Not hard at all  Food Insecurity: No Food Insecurity (11/26/2022)   Hunger Vital Sign    Worried About Running Out of Food in the Last Year: Never true    Ran Out of Food in the Last Year: Never true  Transportation Needs: No Transportation Needs (11/26/2022)   PRAPARE - Administrator, Civil Service (Medical): No    Lack of Transportation (Non-Medical): No  Physical Activity: Inactive (11/26/2022)   Exercise Vital Sign    Days of Exercise per Week: 0 days     Minutes of Exercise per Session: 0 min  Stress: No Stress Concern Present (11/26/2022)   [employer] of Occupational Health - Occupational Stress Questionnaire    Feeling of Stress : Not at all  Social Connections: Moderately Isolated (11/26/2022)   Social Connection and Isolation Panel [NHANES]    Frequency of Communication with Friends and Family: More than three times a week    Frequency of Social Gatherings  with Friends and Family: More than three times a week    Attends Religious Services: Never    Database administrator or Organizations: No    Attends Engineer, structural: Never    Marital Status: Married    Tobacco Counseling Counseling given: Not Answered   Clinical Intake:  Pre-visit preparation completed: Yes  Pain : No/denies pain     BMI - recorded: 27.98 Nutritional Status: BMI 25 -29 Overweight Nutritional Risks: None Diabetes: No  How often do you need to have someone help you when you read instructions, pamphlets, or other written materials from your doctor or pharmacy?: 1 - Never  Diabetic?  No  Interpreter Needed?: No  Information entered by :: Theresa Mulligan LPN   Activities of Daily Living    11/26/2022   10:24 AM 07/06/2022    8:04 AM  In your present state of health, do you have any difficulty performing the following activities:  Hearing? 0 0  Vision? 0 0  Difficulty concentrating or making decisions? 0 0  Walking or climbing stairs? 0 0  Dressing or bathing? 0 0  Doing errands, shopping? 0 0  Preparing Food and eating ? N   Using the Toilet? N   In the past six months, have you accidently leaked urine? N   Do you have problems with loss of bowel control? N   Managing your Medications? N   Managing your Finances? N   Housekeeping or managing your Housekeeping? N     Patient Care Team: Shirline Frees, NP as PCP - General (Family Medicine) Dohmeier, Porfirio Mylar, MD as Consulting Physician (Neurology) Sinda Du, MD as  Consulting Physician (Ophthalmology) Verner Chol, [facility] (Inactive) as Pharmacist (Pharmacist)  Indicate any recent Medical Services you may have received from other than [facility] providers in the past year (date may be approximate).     Assessment:   This is a routine wellness examination for Brigit.  Hearing/Vision screen Hearing Screening - Comments:: Denies hearing difficulties   Vision Screening - Comments:: Wears rx glasses - up to date with routine eye exams with  Dr Cathey Endow  Dietary issues and exercise activities discussed: Exercise limited by: None identified   Goals Addressed               This Visit's Progress     Increase physical activity (pt-stated)         Depression Screen    11/26/2022   10:23 AM 07/06/2022    8:03 AM 01/18/2022   10:36 AM 01/14/2021    2:52 PM 10/19/2018   10:29 AM 04/18/2018    9:10 AM 01/17/2017   10:21 AM  PHQ 2/9 Scores  PHQ - 2 Score 0 0 0 0 0 0 0  PHQ- 9 Score  1   0      Fall Risk    11/26/2022   10:25 AM 07/06/2022    8:04 AM 04/19/2022    6:59 PM 01/18/2022   10:36 AM 01/14/2021    2:51 PM  Fall Risk   Falls in the past year? 0 0 0 0 0  Number falls in past yr: 0 0  0 0  Injury with Fall? 0 0  0 0  Risk for fall due to : No Fall Risks No Fall Risks  Medication side effect   Follow up Falls prevention discussed Falls evaluation completed  Falls evaluation completed;Education provided;Falls prevention discussed Falls evaluation completed    FALL RISK PREVENTION PERTAINING TO  THE HOME:  Any stairs in or around the home? Yes  If so, are there any without handrails? Yes Home free of loose throw rugs in walkways, pet beds, electrical cords, etc? Yes  Adequate lighting in your home to reduce risk of falls? Yes   ASSISTIVE DEVICES UTILIZED TO PREVENT FALLS:  Life alert? No  Use of a cane, walker or w/c? No  Grab bars in the bathroom? Yes  Shower chair or bench in shower? No  Elevated toilet seat or a handicapped toilet? Yes    TIMED UP AND GO:  Was the test performed? No . Audio Visit   Cognitive Function:        11/26/2022   10:26 AM 01/18/2022   10:37 AM  6CIT Screen  What Year? 0 points 0 points  What month? 0 points 0 points  What time? 0 points 0 points  Count back from 20 0 points 0 points  Months in reverse 0 points 0 points  Repeat phrase 0 points 2 points  Total Score 0 points 2 points    Immunizations Immunization History  Administered Date(s) Administered   Fluad Quad(high Dose 65+) 06/13/2019, 07/01/2020, 07/03/2021, 07/06/2022   Influenza Split 05/22/2012, 05/16/2013   Influenza Whole 08/16/2006   Influenza, High Dose Seasonal PF 04/24/2018   Influenza-Unspecified 04/26/2014, 05/09/2015, 03/22/2016   Moderna SARS-COV2 Booster Vaccination 04/14/2021   PFIZER(Purple Top)SARS-COV-2 Vaccination 09/30/2019, 10/23/2019   Pneumococcal Conjugate-13 10/01/2015   Pneumococcal Polysaccharide-23 12/28/2016   Td 08/16/1998, 10/11/2008   Zoster, Live 02/25/2011    TDAP status: Due, Education has been provided regarding the importance of this vaccine. Advised may receive this vaccine at local pharmacy or Health Dept. Aware to provide a copy of the vaccination record if obtained from local pharmacy or Health Dept. Verbalized acceptance and understanding.  Flu Vaccine status: Up to date  Pneumococcal vaccine status: Up to date  Covid-19 vaccine status: Completed vaccines  Qualifies for Shingles Vaccine? Yes   Zostavax completed No   Shingrix Completed?: No.    Education has been provided regarding the importance of this vaccine. Patient has been advised to call insurance company to determine out of pocket expense if they have not yet received this vaccine. Advised may also receive vaccine at local pharmacy or Health Dept. Verbalized acceptance and understanding.  Screening Tests Health Maintenance  Topic Date Due   DTaP/Tdap/Td (3 - Tdap) 10/11/2018   Zoster Vaccines- Shingrix (1 of 2)  02/25/2023 (Originally 08/01/1999)   COVID-19 Vaccine (4 - 2023-24 season) 08/04/2023 (Originally 04/16/2022)   MAMMOGRAM  01/19/2023   INFLUENZA VACCINE  03/17/2023   Medicare Annual Wellness (AWV)  11/26/2023   COLONOSCOPY (Pts 45-51yrs Insurance coverage will need to be confirmed)  06/23/2025   Pneumonia Vaccine 74+ Years old  Completed   DEXA SCAN  Completed   Hepatitis C Screening  Completed   HPV VACCINES  Aged Out    Health Maintenance  Health Maintenance Due  Topic Date Due   DTaP/Tdap/Td (3 - Tdap) 10/11/2018    Colorectal cancer screening: Type of screening: Colonoscopy. Completed 06/23/22. Repeat every 3 years  Mammogram status: Completed 01/18/22. Repeat every year  Bone Density status: Completed 11/15/16. Results reflect: Bone density results: OSTEOPOROSIS. Repeat every   years.  Lung Cancer Screening: (Low Dose CT Chest recommended if Age 76-80 years, 30 pack-year currently smoking OR have quit w/in 15years.) does not qualify.    Additional Screening:  Hepatitis C Screening: does qualify; Completed 07/03/21  Vision Screening: Recommended  annual ophthalmology exams for early detection of glaucoma and other disorders of the eye. Is the patient up to date with their annual eye exam?  Yes  Who is the provider or what is the name of the office in which the patient attends annual eye exams? Dr Cathey Endow If pt is not established with a provider, would they like to be referred to a provider to establish care? No .   Dental Screening: Recommended annual dental exams for proper oral hygiene  Community Resource Referral / Chronic Care Management:  CRR required this visit?  No   CCM required this visit?  No      Plan:     I have personally reviewed and noted the following in the patient's chart:   Medical and social history Use of alcohol, tobacco or illicit drugs  Current medications and supplements including opioid prescriptions. Patient is not currently taking opioid  prescriptions. Functional ability and status Nutritional status Physical activity Advanced directives List of other physicians Hospitalizations, surgeries, and ER visits in previous 12 months Vitals Screenings to include cognitive, depression, and falls Referrals and appointments  In addition, I have reviewed and discussed with patient certain preventive protocols, quality metrics, and best practice recommendations. A written personalized care plan for preventive services as well as general preventive health recommendations were provided to patient.     Tillie Rung, LPN   03/25/1750   Nurse Notes:   None

## 2022-12-06 ENCOUNTER — Telehealth: Payer: Self-pay

## 2022-12-06 NOTE — Progress Notes (Signed)
Patient ID: Krystal Monroe, female   DOB: 22-Oct-1950, 72 y.o.   MRN: [id number]  Care Management & Coordination Services Pharmacy Team  Reason for Encounter: Medication coordination and delivery  Contacted patient to discuss medications and coordinate delivery from [facility]. Spoke with patient on 12/06/2022  Cycle dispensing form sent to [facility] for review.   Last adherence delivery date:09/15/22      Patient is due for next adherence delivery on: 12/15/22  This delivery to include: Vials  90 Days  Triamterene-hydrochlorothiazide (DYAZIDE) 375.-25 mg :Take one tab a day  Escitalopram (LEXAPRO) 10 mg : Take one tab a day  Omeprazole (PRILOSEC) : 20 mg : Take one tab by mouth every other day  Vitamin D 5000 u : Take one tab daily  Atorvastatin 10 mg : Take one tab daily  Patient declined the following medications this month: Triamterene-hydrochlorothiazide (DYAZIDE) 375.-25 mg :Take one tab a day  Atorvastatin 10 mg : Take one tab daily Patient reports abundance on these 2 medications believes she has enough to last at least 30 days but will call if that changes. She starts at one point she got sent a 30 DS when she was still working on her 79 DS Form Updated  No refill request needed.  Delivery scheduled for 12/15/22. Unable to speak with patient to confirm date.    Any concerns about your medications? No  How often do you forget or accidentally miss a dose? Never  Do you use a pillbox? No  Is patient in packaging No  Chart review: Recent office visits:  07/06/22 Shirline Frees, NP - Patient presented for Routine General Medical Exam at a health care facility and other concerns. No medication changes.   Recent consult visits:  11/26/22 Tillie Rung, LPN - Patient presented for [facility] Annual Wellness Exam. Patient voiced goal of increased physical activity. No medication changes.   Hospital visits:  None in previous 6 months  Medications: Outpatient Encounter  Medications as of 12/06/2022  Medication Sig   atorvastatin (LIPITOR) 10 MG tablet Take 1 tablet (10 mg total) by mouth daily.   cholecalciferol (VITAMIN D3) 25 MCG (1000 UNIT) tablet Take 5,000 Units by mouth daily.   escitalopram (LEXAPRO) 10 MG tablet Take 1 tablet (10 mg total) by mouth daily.   omeprazole (PRILOSEC) 20 MG capsule TAKE ONE CAPSULE BY MOUTH EVERY OTHER DAY (Patient taking differently: Take 20 mg by mouth daily.)   psyllium (METAMUCIL) 58.6 % powder Take 1 packet by mouth daily.   tretinoin (RETIN-A) 0.025 % cream SMARTSIG:1 sparingly Topical Every Night   triamterene-hydrochlorothiazide (DYAZIDE) 37.5-25 MG capsule TAKE ONE CAPSULE BY MOUTH ONCE DAILY   No facility-administered encounter medications on file as of 12/06/2022.   BP Readings from Last 3 Encounters:  07/06/22 110/64  06/23/22 (!) 108/51  04/20/22 (!) 130/90    Pulse Readings from Last 3 Encounters:  07/06/22 75  06/23/22 62  04/20/22 60    Lab Results  Component Value Date/Time   HGBA1C 6.3 07/06/2022 08:40 AM   HGBA1C 6.1 07/03/2021 08:39 AM   Lab Results  Component Value Date   CREATININE 0.83 07/06/2022   BUN 14 07/06/2022   GFR 70.18 07/06/2022   GFRNONAA 71 07/04/2020   GFRAA 83 07/04/2020   NA 138 07/06/2022   K 3.5 07/06/2022   CALCIUM 9.6 07/06/2022   CO2 27 07/06/2022      [name] CMA Clinical Pharmacist Assistant [phone]

## 2023-01-17 ENCOUNTER — Encounter: Payer: Self-pay | Admitting: Adult Health

## 2023-01-19 ENCOUNTER — Telehealth (INDEPENDENT_AMBULATORY_CARE_PROVIDER_SITE_OTHER): Payer: PPO | Admitting: Adult Health

## 2023-01-19 ENCOUNTER — Encounter: Payer: Self-pay | Admitting: Adult Health

## 2023-01-19 VITALS — Ht 64.0 in | Wt 163.0 lb

## 2023-01-19 DIAGNOSIS — K5792 Diverticulitis of intestine, part unspecified, without perforation or abscess without bleeding: Secondary | ICD-10-CM

## 2023-01-19 MED ORDER — ONDANSETRON HCL 4 MG PO TABS: 4.0000 mg | ORAL_TABLET | Freq: Three times a day (TID) | ORAL | 0 refills | Status: DC | PRN

## 2023-01-19 MED ORDER — CIPROFLOXACIN HCL 250 MG PO TABS: 250.0000 mg | ORAL_TABLET | Freq: Two times a day (BID) | ORAL | 0 refills | Status: AC

## 2023-01-19 MED ORDER — METRONIDAZOLE 500 MG PO TABS: 500.0000 mg | ORAL_TABLET | Freq: Two times a day (BID) | ORAL | 0 refills | Status: AC

## 2023-01-19 NOTE — Progress Notes (Signed)
Virtual Visit via Video Note  I connected with Krystal Monroe on 01/19/23 at  7:30 AM EDT by a video enabled telemedicine application and verified that I am speaking with the correct person using two identifiers.  Location patient: home Location provider:work or home office Persons participating in the virtual visit: patient, provider  I discussed the limitations of evaluation and management by telemedicine and the availability of in person appointments. The patient expressed understanding and agreed to proceed.   HPI: 72 year old female who  has a past medical history of Anxiety, Asthma, Basal cell carcinoma of nose, Colon polyps, Depression, Difficult airway for intubation, initial encounter (10/15/2020), Diverticulosis of colon, Gallstones, GERD (gastroesophageal reflux disease), Hypertension, Obesity, Sleep apnea, and Trigeminal neuralgia.  She has a history of diverticulitis with her last flare being in 2020.  Days ago she started to develop pain across her lower abdomen more severe towards the right lower quadrant, this is usually where she develops pain with diverticulitis.  Following day the pain became more severe, she darted Cipro and Flagyl that she had on hand and her pain improved.  She did get nauseous with taking Cipro 500 mg, in the past she has had to split this to 250 twice a day.  She continues to have mild tenderness but nothing like she experienced a few days ago.  She has not had any blood in her stool, vomiting, bloating, or diarrhea.   ROS: See pertinent positives and negatives per HPI.  Past Medical History:  Diagnosis Date   Anxiety    son killed in MVA   Asthma    as a child   Basal cell carcinoma of nose    Colon polyps    2011   Depression    son killed in MVA   Difficult airway for intubation, initial encounter 10/15/2020   light wand intubation   Diverticulosis of colon    diverticulitits   Gallstones    GERD (gastroesophageal reflux disease)     Hypertension    Obesity    Sleep apnea    mild per pt-no tx   Trigeminal neuralgia     Past Surgical History:  Procedure Laterality Date   ABDOMINAL HYSTERECTOMY     1997   CATARACT EXTRACTION Left 06/05/2020   per patient   CESAREAN SECTION  1976, 1982, 1984   x 3   CHOLECYSTECTOMY  2002   COLONOSCOPY  last 03/15/2016   EYE SURGERY     ROTATOR CUFF REPAIR Right 2017   TOTAL ABDOMINAL HYSTERECTOMY W/ BILATERAL SALPINGOOPHORECTOMY  2001   UPPER GASTROINTESTINAL ENDOSCOPY      Family History  Problem Relation Age of Onset   Diabetes Maternal Grandmother    Crohn's disease Father    Lung cancer Father    Breast cancer Mother    Osteoporosis Mother    Dementia Mother    Diabetes Mother    Hypertension Mother    Colon cancer Neg Hx    Esophageal cancer Neg Hx    Rectal cancer Neg Hx    Stomach cancer Neg Hx    Colon polyps Neg Hx        Current Outpatient Medications:    atorvastatin (LIPITOR) 10 MG tablet, Take 1 tablet (10 mg total) by mouth daily., Disp: 90 tablet, Rfl: 3   cholecalciferol (VITAMIN D3) 25 MCG (1000 UNIT) tablet, Take 5,000 Units by mouth daily., Disp: , Rfl:    escitalopram (LEXAPRO) 10 MG tablet, Take 1 tablet (10  mg total) by mouth daily., Disp: 90 tablet, Rfl: 1   omeprazole (PRILOSEC) 20 MG capsule, TAKE ONE CAPSULE BY MOUTH EVERY OTHER DAY (Patient taking differently: Take 20 mg by mouth daily.), Disp: 90 capsule, Rfl: 2   psyllium (METAMUCIL) 58.6 % powder, Take 1 packet by mouth daily., Disp: , Rfl:    tretinoin (RETIN-A) 0.025 % cream, SMARTSIG:1 sparingly Topical Every Night, Disp: , Rfl:    triamterene-hydrochlorothiazide (DYAZIDE) 37.5-25 MG capsule, TAKE ONE CAPSULE BY MOUTH ONCE DAILY, Disp: 90 capsule, Rfl: 3  EXAM:  VITALS per patient if applicable:  GENERAL: alert, oriented, appears well and in no acute distress  HEENT: atraumatic, conjunttiva clear, no obvious abnormalities on inspection of external nose and ears  NECK: normal  movements of the head and neck  LUNGS: on inspection no signs of respiratory distress, breathing rate appears normal, no obvious gross SOB, gasping or wheezing  CV: no obvious cyanosis  MS: moves all visible extremities without noticeable abnormality  PSYCH/NEURO: pleasant and cooperative, no obvious depression or anxiety, speech and thought processing grossly intact  ASSESSMENT AND PLAN:  Discussed the following assessment and plan:  1. Diverticulitis of intestine without perforation or abscess without bleeding, unspecified part of intestinal tract -Will treat for suspected diverticulitis flare with 7 days of cipro and flagyl to complete treatment. Follow up if not resolved by the end of abx therapy or sooner if symptoms worsen  - ciprofloxacin (CIPRO) 250 MG tablet; Take 1 tablet (250 mg total) by mouth 2 (two) times daily for 7 days.  Dispense: 14 tablet; Refill: 0 - metroNIDAZOLE (FLAGYL) 500 MG tablet; Take 1 tablet (500 mg total) by mouth 2 (two) times daily for 7 days.  Dispense: 14 tablet; Refill: 0 - ondansetron (ZOFRAN) 4 MG tablet; Take 1 tablet (4 mg total) by mouth every 8 (eight) hours as needed for nausea or vomiting.  Dispense: 20 tablet; Refill: 0   Shirline Frees, NP     I discussed the assessment and treatment plan with the patient. The patient was provided an opportunity to ask questions and all were answered. The patient agreed with the plan and demonstrated an understanding of the instructions.   The patient was advised to call back or seek an in-person evaluation if the symptoms worsen or if the condition fails to improve as anticipated.   Shirline Frees, NP

## 2023-01-24 DIAGNOSIS — Z1231 Encounter for screening mammogram for malignant neoplasm of breast: Secondary | ICD-10-CM | POA: Diagnosis not present

## 2023-01-24 LAB — HM MAMMOGRAPHY

## 2023-01-26 ENCOUNTER — Encounter: Payer: Self-pay | Admitting: Adult Health

## 2023-03-10 ENCOUNTER — Other Ambulatory Visit: Payer: Self-pay | Admitting: Adult Health

## 2023-03-10 DIAGNOSIS — K2100 Gastro-esophageal reflux disease with esophagitis, without bleeding: Secondary | ICD-10-CM

## 2023-03-10 DIAGNOSIS — F32A Depression, unspecified: Secondary | ICD-10-CM

## 2023-03-10 DIAGNOSIS — F419 Anxiety disorder, unspecified: Secondary | ICD-10-CM

## 2023-03-17 DIAGNOSIS — H5213 Myopia, bilateral: Secondary | ICD-10-CM | POA: Diagnosis not present

## 2023-03-17 DIAGNOSIS — H2513 Age-related nuclear cataract, bilateral: Secondary | ICD-10-CM | POA: Diagnosis not present

## 2023-04-13 DIAGNOSIS — H2511 Age-related nuclear cataract, right eye: Secondary | ICD-10-CM | POA: Diagnosis not present

## 2023-04-13 DIAGNOSIS — H25811 Combined forms of age-related cataract, right eye: Secondary | ICD-10-CM | POA: Diagnosis not present

## 2023-04-21 ENCOUNTER — Encounter: Payer: Self-pay | Admitting: Adult Health

## 2023-04-21 ENCOUNTER — Ambulatory Visit (INDEPENDENT_AMBULATORY_CARE_PROVIDER_SITE_OTHER): Payer: PPO | Admitting: Adult Health

## 2023-04-21 VITALS — BP 110/62 | HR 69 | Temp 98.4°F | Ht 64.0 in | Wt 163.0 lb

## 2023-04-21 DIAGNOSIS — R300 Dysuria: Secondary | ICD-10-CM

## 2023-04-21 LAB — POC URINALSYSI DIPSTICK (AUTOMATED)
Bilirubin, UA: NEGATIVE
Glucose, UA: NEGATIVE
Ketones, UA: NEGATIVE
Nitrite, UA: NEGATIVE
Protein, UA: POSITIVE — AB
Spec Grav, UA: 1.01 (ref 1.010–1.025)
Urobilinogen, UA: NEGATIVE U/dL — AB
pH, UA: 6 (ref 5.0–8.0)

## 2023-04-21 MED ORDER — NITROFURANTOIN MONOHYD MACRO 100 MG PO CAPS: 100.0000 mg | ORAL_CAPSULE | Freq: Two times a day (BID) | ORAL | 0 refills | Status: DC

## 2023-04-21 MED ORDER — PHENAZOPYRIDINE HCL 200 MG PO TABS
200.0000 mg | ORAL_TABLET | Freq: Three times a day (TID) | ORAL | 0 refills | Status: DC | PRN
Start: 2023-04-21 — End: 2023-07-08

## 2023-04-21 NOTE — Progress Notes (Signed)
Subjective:    Patient ID: Krystal Monroe, female    DOB: [birth year], 72 y.o.   MRN: [id number]  Urinary Tract Infection  This is a new problem. The current episode started in the past 7 days. The problem occurs every urination. The problem has been unchanged. The pain is mild. Associated symptoms include frequency, hesitancy and urgency. Pertinent negatives include no discharge, nausea or vomiting. She has tried increased fluids for the symptoms. The treatment provided no relief.     Review of Systems  Gastrointestinal:  Negative for nausea and vomiting.  Genitourinary:  Positive for difficulty urinating, dysuria, frequency, hesitancy and urgency.   Past Medical History:  Diagnosis Date   Anxiety    son killed in MVA   Asthma    as a child   Basal cell carcinoma of nose    Colon polyps    2011   Depression    son killed in MVA   Difficult airway for intubation, initial encounter 10/15/2020   light wand intubation   Diverticulosis of colon    diverticulitits   Gallstones    GERD (gastroesophageal reflux disease)    Hypertension    Obesity    Sleep apnea    mild per pt-no tx   Trigeminal neuralgia     Social History   Socioeconomic History   Marital status: Married    Spouse name: Not on file   Number of children: 3   Years of education: BSN   Highest education level: Bachelor's degree (e.g., BA, AB, BS)  Occupational History   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   Enjoys reading, needlework, painting            Social Determinants of Health   Financial Resource Strain: Low Risk  (11/26/2022)   Overall Financial Resource Strain (CARDIA)    Difficulty of Paying Living Expenses: Not hard at all  Food Insecurity: No Food Insecurity (11/26/2022)   Hunger Vital Sign    Worried About Running Out of Food in the Last Year: Never true    Ran Out of Food in the Last Year: Never true  Transportation Needs: No Transportation Needs (11/26/2022)   PRAPARE - Administrator, Civil Service (Medical): No    Lack of Transportation (Non-Medical): No  Physical Activity: Inactive (11/26/2022)   Exercise Vital Sign    Days of Exercise per Week: 0 days    Minutes of Exercise per Session: 0 min  Stress: No Stress Concern Present (11/26/2022)   [employer] of Occupational Health - Occupational Stress Questionnaire    Feeling of Stress : Not at all  Social Connections: Moderately Isolated (11/26/2022)  Social Advertising account executive [NHANES]    Frequency of Communication with Friends and Family: More than three times a week    Frequency of Social Gatherings with Friends and Family: More than three times a week    Attends Religious Services: Never    Database administrator or Organizations: No    Attends Banker Meetings: Never    Marital Status: Married  Catering manager Violence: Not At Risk (11/26/2022)   Humiliation, Afraid, Rape, and Kick questionnaire    Fear of Current or Ex-Partner: No    Emotionally Abused: No    Physically Abused: No    Sexually Abused: No    Past Surgical History:  Procedure Laterality Date   ABDOMINAL HYSTERECTOMY     1997   CATARACT EXTRACTION Left 06/05/2020   per patient   CESAREAN SECTION  1976, 1982, 1984   x 3   CHOLECYSTECTOMY   2002   COLONOSCOPY  last 03/15/2016   EYE SURGERY     ROTATOR CUFF REPAIR Right 2017   TOTAL ABDOMINAL HYSTERECTOMY W/ BILATERAL SALPINGOOPHORECTOMY  2001   UPPER GASTROINTESTINAL ENDOSCOPY      Family History  Problem Relation Age of Onset   Diabetes Maternal Grandmother    Crohn's disease Father    Lung cancer Father    Breast cancer Mother    Osteoporosis Mother    Dementia Mother    Diabetes Mother    Hypertension Mother    Colon cancer Neg Hx    Esophageal cancer Neg Hx    Rectal cancer Neg Hx    Stomach cancer Neg Hx    Colon polyps Neg Hx     Allergies  Allergen Reactions   Iodine-131 Rash   Other Hives, Rash and Other (See Comments)    Magnavist Contrast    Current Outpatient Medications on File Prior to Visit  Medication Sig Dispense Refill   cholecalciferol (VITAMIN D3) 25 MCG (1000 UNIT) tablet Take 5,000 Units by mouth daily.     escitalopram (LEXAPRO) 10 MG tablet TAKE ONE TABLET BY MOUTH ONCE DAILY 90 tablet 1   omeprazole (PRILOSEC) 20 MG capsule TAKE ONE CAPSULE BY MOUTH EVERY OTHER DAY 90 capsule 2   psyllium (METAMUCIL) 58.6 % powder Take 1 packet by mouth daily.     tretinoin (RETIN-A) 0.025 % cream SMARTSIG:1 sparingly Topical Every Night     triamterene-hydrochlorothiazide (DYAZIDE) 37.5-25 MG capsule TAKE ONE CAPSULE BY MOUTH ONCE DAILY 90 capsule 3   atorvastatin (LIPITOR) 10 MG tablet Take 1 tablet (10 mg total) by mouth daily. (Patient not taking: Reported on 04/21/2023) 90 tablet 3   ondansetron (ZOFRAN) 4 MG tablet Take 1 tablet (4 mg total) by mouth every 8 (eight) hours as needed for nausea or vomiting. (Patient not taking: Reported on 04/21/2023) 20 tablet 0   No current facility-administered medications on file prior to visit.    BP 110/62 (BP Location: Left Arm, Patient Position: Sitting, Cuff Size: Normal)   Pulse 69   Temp 98.4 F (36.9 C) (Oral)   Ht 5\' 4"  (1.626 m)   Wt 163 lb (73.9 kg)   SpO2 97%   BMI 27.98 kg/m        Objective:   Physical Exam Vitals and nursing note reviewed.  Constitutional:      Appearance: Normal appearance.  Cardiovascular:     Rate and Rhythm: Normal rate and regular rhythm.     Pulses: Normal pulses.     Heart  sounds: Normal heart sounds.  Pulmonary:     Effort: Pulmonary effort is normal.     Breath sounds: Normal breath sounds.  Abdominal:     General: Abdomen is flat. Bowel sounds are normal.     Palpations: Abdomen is soft.     Tenderness: There is no right CVA tenderness or left CVA tenderness.  Skin:    General: Skin is warm and dry.  Neurological:     General: No focal deficit present.     Mental Status: She is alert and oriented to person, place, and time.  Psychiatric:        Mood and Affect: Mood normal.        Behavior: Behavior normal.        Thought Content: Thought content normal.        Judgment: Judgment normal.       Assessment & Plan:  1. Dysuria  - POCT Urinalysis Dipstick (Automated) + leuks, protein and blood.  - Will send culture and start treatment with macrobid  - Culture, Urine - nitrofurantoin, macrocrystal-monohydrate, (MACROBID) 100 MG capsule; Take 1 capsule (100 mg total) by mouth 2 (two) times daily for 5 days.  Dispense: 10 capsule; Refill: 0 - phenazopyridine (PYRIDIUM) 200 MG tablet; Take 1 tablet (200 mg total) by mouth 3 (three) times daily as needed for pain.  Dispense: 10 tablet; Refill: 0 - Follow up as needed  Shirline Frees, NP

## 2023-04-24 LAB — URINE CULTURE
MICRO NUMBER:: 15426359
SPECIMEN QUALITY:: ADEQUATE

## 2023-04-25 ENCOUNTER — Encounter: Payer: Self-pay | Admitting: Adult Health

## 2023-04-26 ENCOUNTER — Other Ambulatory Visit: Payer: Self-pay | Admitting: Adult Health

## 2023-04-26 MED ORDER — SULFAMETHOXAZOLE-TRIMETHOPRIM 800-160 MG PO TABS: 1.0000 | ORAL_TABLET | Freq: Two times a day (BID) | ORAL | 0 refills | Status: AC

## 2023-04-26 NOTE — Telephone Encounter (Signed)
Please advise 

## 2023-07-08 ENCOUNTER — Ambulatory Visit: Payer: PPO | Admitting: Adult Health

## 2023-07-08 ENCOUNTER — Encounter: Payer: Self-pay | Admitting: Adult Health

## 2023-07-08 VITALS — BP 130/70 | HR 65 | Temp 97.5°F | Ht 62.5 in | Wt 163.0 lb

## 2023-07-08 DIAGNOSIS — I10 Essential (primary) hypertension: Secondary | ICD-10-CM

## 2023-07-08 DIAGNOSIS — F32A Depression, unspecified: Secondary | ICD-10-CM

## 2023-07-08 DIAGNOSIS — E785 Hyperlipidemia, unspecified: Secondary | ICD-10-CM | POA: Diagnosis not present

## 2023-07-08 DIAGNOSIS — Z0000 Encounter for general adult medical examination without abnormal findings: Secondary | ICD-10-CM

## 2023-07-08 DIAGNOSIS — M859 Disorder of bone density and structure, unspecified: Secondary | ICD-10-CM

## 2023-07-08 DIAGNOSIS — R7303 Prediabetes: Secondary | ICD-10-CM | POA: Diagnosis not present

## 2023-07-08 DIAGNOSIS — E559 Vitamin D deficiency, unspecified: Secondary | ICD-10-CM

## 2023-07-08 LAB — CBC WITH DIFFERENTIAL/PLATELET
Basophils Absolute: 0 10*3/uL (ref 0.0–0.1)
Basophils Relative: 0.4 % (ref 0.0–3.0)
Eosinophils Absolute: 0.1 10*3/uL (ref 0.0–0.7)
Eosinophils Relative: 1.4 % (ref 0.0–5.0)
HCT: 40.4 % (ref 36.0–46.0)
Hemoglobin: 13.3 g/dL (ref 12.0–15.0)
Lymphocytes Relative: 25.8 % (ref 12.0–46.0)
Lymphs Abs: 1.3 10*3/uL (ref 0.7–4.0)
MCHC: 33 g/dL (ref 30.0–36.0)
MCV: 86.7 fL (ref 78.0–100.0)
Monocytes Absolute: 0.3 10*3/uL (ref 0.1–1.0)
Monocytes Relative: 5.7 % (ref 3.0–12.0)
Neutro Abs: 3.4 10*3/uL (ref 1.4–7.7)
Neutrophils Relative %: 66.7 % (ref 43.0–77.0)
Platelets: 201 10*3/uL (ref 150.0–400.0)
RBC: 4.66 Mil/uL (ref 3.87–5.11)
RDW: 13.6 % (ref 11.5–15.5)
WBC: 5.1 10*3/uL (ref 4.0–10.5)

## 2023-07-08 LAB — COMPREHENSIVE METABOLIC PANEL
ALT: 11 U/L (ref 0–35)
AST: 14 U/L (ref 0–37)
Albumin: 4.4 g/dL (ref 3.5–5.2)
Alkaline Phosphatase: 48 U/L (ref 39–117)
BUN: 12 mg/dL (ref 6–23)
CO2: 28 meq/L (ref 19–32)
Calcium: 9.5 mg/dL (ref 8.4–10.5)
Chloride: 101 meq/L (ref 96–112)
Creatinine, Ser: 0.76 mg/dL (ref 0.40–1.20)
GFR: 77.46 mL/min (ref >60.00)
Glucose, Bld: 107 mg/dL — ABNORMAL HIGH (ref 70–99)
Potassium: 3.8 meq/L (ref 3.5–5.1)
Sodium: 137 meq/L (ref 135–145)
Total Bilirubin: 1 mg/dL (ref 0.2–1.2)
Total Protein: 6.8 g/dL (ref 6.0–8.3)

## 2023-07-08 LAB — LIPID PANEL
Cholesterol: 183 mg/dL (ref 0–200)
HDL: 37.8 mg/dL — ABNORMAL LOW (ref >39.00)
LDL Cholesterol: 113 mg/dL — ABNORMAL HIGH (ref 0–99)
NonHDL: 145.55
Total CHOL/HDL Ratio: 5
Triglycerides: 164 mg/dL — ABNORMAL HIGH (ref 0.0–149.0)
VLDL: 32.8 mg/dL (ref 0.0–40.0)

## 2023-07-08 LAB — HEMOGLOBIN A1C: Hgb A1c MFr Bld: 5.9 % (ref 4.6–6.5)

## 2023-07-08 LAB — TSH: TSH: 1.41 u[IU]/mL (ref 0.35–5.50)

## 2023-07-08 LAB — VITAMIN D 25 HYDROXY (VIT D DEFICIENCY, FRACTURES): VITD: 41.71 ng/mL (ref 30.00–100.00)

## 2023-07-08 MED ORDER — TRETINOIN 0.025 % EX CREA: TOPICAL_CREAM | Freq: Every day | CUTANEOUS | 3 refills | Status: DC

## 2023-07-08 NOTE — Patient Instructions (Signed)
It was great seeing you today   We will follow up with you regarding your lab work   Please let me know if you need anything   Schedule your bone density test at check out desk. You may also call directly to X-ray at [phone] to schedule an appointment that is convenient for you.  - located [address] across the street from [place] - in the basement - you do need an appointment for the bone density tests.

## 2023-07-08 NOTE — Progress Notes (Signed)
Subjective:    Patient ID: Krystal Monroe, female    DOB: 1950/09/21, 72 y.o.   MRN: [id number]  HPI Patient presents for yearly preventative medicine examination. She is a pleasant 72 year old female who  has a past medical history of Anxiety, Asthma, Basal cell carcinoma of nose, Colon polyps, Depression, Difficult airway for intubation, initial encounter (10/15/2020), Diverticulosis of colon, Gallstones, GERD (gastroesophageal reflux disease), Hypertension, Obesity, Sleep apnea, and Trigeminal neuralgia.  Hypertension-managed with Dyazide 37.5-25 mg daily.  She does monitor her blood pressure at home and currently gets readings in the 110s to 120s systolic.  She denies dizziness, lightheadedness, chest pain, or shortness of breath BP Readings from Last 3 Encounters:  07/08/23 130/70  04/21/23 110/62  07/06/22 110/64    Depression- she has started tapering off Lexapro as she feels as though she does not need it any longer.   Vitamin D deficiency-takes 5000 units daily Last vitamin D Lab Results  Component Value Date   VD25OH 46.58 07/06/2022    Pre diabetes- not currently on medication. She wanted to work on lifestyle modifications.  Lab Results  Component Value Date   HGBA1C 6.3 07/06/2022   HGBA1C 6.1 07/03/2021   HGBA1C 6.2 (H) 07/04/2020    Hyperlipidemia - has been prescribed Liptor in the past but did not start taking it due to changing her diet.  Lab Results  Component Value Date   CHOL 184 07/06/2022   HDL 42.30 07/06/2022   LDLCALC 106 (H) 07/04/2020   LDLDIRECT 112.0 07/06/2022   TRIG 217.0 (H) 07/06/2022   CHOLHDL 4 07/06/2022    All immunizations and health maintenance protocols were reviewed with the patient and needed orders were placed.  Appropriate screening laboratory values were ordered for the patient including screening of hyperlipidemia, renal function and hepatic function.   Medication reconciliation,  past medical history, social history,  problem list and allergies were reviewed in detail with the patient  Goals were established with regard to weight loss, exercise, and  diet in compliance with medications. She has been working hard to lose weight; she is eating cleaner. She does stay active with activities around the home but does not exercise at the gym.   Wt Readings from Last 10 Encounters:  07/08/23 163 lb (73.9 kg)  04/21/23 163 lb (73.9 kg)  01/19/23 163 lb (73.9 kg)  11/26/22 163 lb (73.9 kg)  07/06/22 170 lb (77.1 kg)  06/23/22 173 lb (78.5 kg)  05/26/22 173 lb (78.5 kg)  04/20/22 173 lb (78.5 kg)  01/18/22 169 lb (76.7 kg)  07/03/21 169 lb (76.7 kg)    Review of Systems  Constitutional: Negative.   HENT: Negative.    Eyes: Negative.   Respiratory: Negative.    Cardiovascular: Negative.   Gastrointestinal: Negative.   Endocrine: Negative.   Genitourinary: Negative.   Musculoskeletal: Negative.   Skin: Negative.   Allergic/Immunologic: Negative.   Neurological: Negative.   Hematological: Negative.   Psychiatric/Behavioral: Negative.     Past Medical History:  Diagnosis Date   Anxiety    son killed in MVA   Asthma    as a child   Basal cell carcinoma of nose    Colon polyps    2011   Depression    son killed in MVA   Difficult airway for intubation, initial encounter 10/15/2020   light wand intubation   Diverticulosis of colon    diverticulitits   Gallstones    GERD (gastroesophageal reflux disease)  Hypertension    Obesity    Sleep apnea    mild per pt-no tx   Trigeminal neuralgia     Social History   Socioeconomic History   Marital status: Married    Spouse name: Not on file   Number of children: 3   Years of education: BSN   Highest education level: Bachelor's degree (e.g., BA, AB, BS)  Occupational History   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   Enjoys reading, needlework, painting            Social Determinants of Health   Financial Resource Strain: Low Risk  (11/26/2022)   Overall Financial Resource Strain (CARDIA)    Difficulty of Paying Living Expenses: Not hard at all  Food Insecurity: No Food Insecurity (11/26/2022)   Hunger Vital Sign    Worried About Running Out of Food in the Last Year: Never true    Ran Out of Food in the Last Year: Never true  Transportation Needs: No Transportation Needs (11/26/2022)   PRAPARE - Administrator, Civil Service (Medical): No    Lack of Transportation (Non-Medical): No  Physical Activity: Inactive (11/26/2022)   Exercise Vital Sign    Days of Exercise per Week: 0 days    Minutes of Exercise per Session: 0 min  Stress: No Stress Concern Present (11/26/2022)   [employer] of Occupational Health - Occupational Stress Questionnaire    Feeling of Stress : Not at all  Social Connections: Moderately Isolated (11/26/2022)   Social Connection and Isolation Panel [NHANES]    Frequency of Communication with Friends and Family: More than three times a week    Frequency of Social Gatherings with Friends and Family: More than three times a week    Attends Religious Services: Never    Database administrator or Organizations: No    Attends Banker Meetings: Never    Marital Status: Married  Catering manager Violence: Not At Risk (11/26/2022)    Humiliation, Afraid, Rape, and Kick questionnaire    Fear of Current or Ex-Partner: No    Emotionally Abused: No    Physically Abused: No    Sexually Abused: No    Past Surgical History:  Procedure Laterality Date   ABDOMINAL HYSTERECTOMY     1997   CATARACT EXTRACTION Left 06/05/2020   per patient   CESAREAN SECTION  1976, 1982, 1984   x 3   CHOLECYSTECTOMY  2002   COLONOSCOPY  last 03/15/2016   EYE SURGERY     ROTATOR CUFF REPAIR Right 2017   TOTAL ABDOMINAL HYSTERECTOMY W/ BILATERAL SALPINGOOPHORECTOMY  2001   UPPER GASTROINTESTINAL ENDOSCOPY  Family History  Problem Relation Age of Onset   Diabetes Maternal Grandmother    Crohn's disease Father    Lung cancer Father    Breast cancer Mother    Osteoporosis Mother    Dementia Mother    Diabetes Mother    Hypertension Mother    Colon cancer Neg Hx    Esophageal cancer Neg Hx    Rectal cancer Neg Hx    Stomach cancer Neg Hx    Colon polyps Neg Hx     Allergies  Allergen Reactions   Iodine-131 Rash   Other Hives, Rash and Other (See Comments)    Magnavist Contrast    Current Outpatient Medications on File Prior to Visit  Medication Sig Dispense Refill   cholecalciferol (VITAMIN D3) 25 MCG (1000 UNIT) tablet Take 5,000 Units by mouth daily.     escitalopram (LEXAPRO) 10 MG tablet TAKE ONE TABLET BY MOUTH ONCE DAILY 90 tablet 1   phenazopyridine (PYRIDIUM) 200 MG tablet Take 1 tablet (200 mg total) by mouth 3 (three) times daily as needed for pain. 10 tablet 0   psyllium (METAMUCIL) 58.6 % powder Take 1 packet by mouth daily.     tretinoin (RETIN-A) 0.025 % cream SMARTSIG:1 sparingly Topical Every Night     triamterene-hydrochlorothiazide (DYAZIDE) 37.5-25 MG capsule TAKE ONE CAPSULE BY MOUTH ONCE DAILY 90 capsule 3   atorvastatin (LIPITOR) 10 MG tablet Take 1 tablet (10 mg total) by mouth daily. (Patient not taking: Reported on 07/08/2023) 90 tablet 3   omeprazole (PRILOSEC) 20 MG capsule TAKE ONE CAPSULE BY  MOUTH EVERY OTHER DAY (Patient not taking: Reported on 07/08/2023) 90 capsule 2   No current facility-administered medications on file prior to visit.    BP 130/70   Pulse 65   Temp (!) 97.5 F (36.4 C) (Oral)   Ht 5' 2.5" (1.588 m)   Wt 163 lb (73.9 kg)   SpO2 97%   BMI 29.34 kg/m       Objective:   Physical Exam Vitals and nursing note reviewed.  Constitutional:      General: She is not in acute distress.    Appearance: Normal appearance. She is not ill-appearing.  HENT:     Head: Normocephalic and atraumatic.     Right Ear: Tympanic membrane, ear canal and external ear normal. There is no impacted cerumen.     Left Ear: Tympanic membrane, ear canal and external ear normal. There is no impacted cerumen.     Nose: Nose normal. No congestion or rhinorrhea.     Mouth/Throat:     Mouth: Mucous membranes are moist.     Pharynx: Oropharynx is clear.  Eyes:     Extraocular Movements: Extraocular movements intact.     Conjunctiva/sclera: Conjunctivae normal.     Pupils: Pupils are equal, round, and reactive to light.  Neck:     Vascular: No carotid bruit.  Cardiovascular:     Rate and Rhythm: Normal rate and regular rhythm.     Pulses: Normal pulses.     Heart sounds: No murmur heard.    No friction rub. No gallop.  Pulmonary:     Effort: Pulmonary effort is normal.     Breath sounds: Normal breath sounds.  Abdominal:     General: Abdomen is flat. Bowel sounds are normal. There is no distension.     Palpations: Abdomen is soft. There is no mass.     Tenderness: There is no abdominal tenderness. There is no  guarding or rebound.     Hernia: No hernia is present.  Musculoskeletal:        General: Normal range of motion.     Cervical back: Normal range of motion and neck supple.  Lymphadenopathy:     Cervical: No cervical adenopathy.  Skin:    General: Skin is warm and dry.     Capillary Refill: Capillary refill takes less than 2 seconds.  Neurological:     General:  No focal deficit present.     Mental Status: She is alert and oriented to person, place, and time.  Psychiatric:        Mood and Affect: Mood normal.        Behavior: Behavior normal.        Thought Content: Thought content normal.        Judgment: Judgment normal.       Assessment & Plan:  1. Routine general medical examination at a health care facility Today patient counseled on age appropriate routine health concerns for screening and prevention, each reviewed and up to date or declined. Immunizations reviewed and up to date or declined. Labs ordered and reviewed. Risk factors for depression reviewed and negative. Hearing function and visual acuity are intact. ADLs screened and addressed as needed. Functional ability and level of safety reviewed and appropriate. Education, counseling and referrals performed based on assessed risks today. Patient provided with a copy of personalized plan for preventive services. - Continue to eat healthy and exercise - Follow up on one year or sooner if needed  2. Essential hypertension - Well controlled. No change in medication  - Vitamin D, 25-hydroxy; Future - CBC with Differential/Platelet; Future - Comprehensive metabolic panel; Future - Lipid panel; Future - TSH; Future  3. Depression, unspecified depression type - Discussed tapering instructions with the patient.  - Vitamin D, 25-hydroxy; Future - CBC with Differential/Platelet; Future - Comprehensive metabolic panel; Future - Lipid panel; Future - TSH; Future  4. Vitamin D deficiency - Continue with Vitamin D - Vitamin D, 25-hydroxy; Future - CBC with Differential/Platelet; Future - Comprehensive metabolic panel; Future - Lipid panel; Future - TSH; Future  5. Prediabetes - Consider Metformin  - Vitamin D, 25-hydroxy; Future - CBC with Differential/Platelet; Future - Comprehensive metabolic panel; Future - Lipid panel; Future - TSH; Future - Hemoglobin A1c; Future  6.  Hyperlipidemia, unspecified hyperlipidemia type - Will do CT calcium scoring  - Vitamin D, 25-hydroxy; Future - CBC with Differential/Platelet; Future - Comprehensive metabolic panel; Future - Lipid panel; Future - TSH; Future - CT CARDIAC SCORING (SELF PAY ONLY); Future  7. Low bone density  - DG Bone Density; Future   Shirline Frees, NP

## 2023-07-10 ENCOUNTER — Other Ambulatory Visit: Payer: Self-pay | Admitting: Adult Health

## 2023-07-10 DIAGNOSIS — I10 Essential (primary) hypertension: Secondary | ICD-10-CM

## 2023-08-24 ENCOUNTER — Encounter: Payer: Self-pay | Admitting: Adult Health

## 2023-08-24 ENCOUNTER — Ambulatory Visit (INDEPENDENT_AMBULATORY_CARE_PROVIDER_SITE_OTHER)
Admission: RE | Admit: 2023-08-24 | Discharge: 2023-08-24 | Disposition: A | Discharge status: Home / Self Care | Payer: PPO | Source: Ambulatory Visit | Attending: Adult Health | Admitting: Adult Health

## 2023-08-24 DIAGNOSIS — M859 Disorder of bone density and structure, unspecified: Secondary | ICD-10-CM

## 2023-08-26 NOTE — Telephone Encounter (Signed)
 FYI

## 2023-09-01 ENCOUNTER — Ambulatory Visit (INDEPENDENT_AMBULATORY_CARE_PROVIDER_SITE_OTHER): Payer: PPO | Admitting: Adult Health

## 2023-09-01 ENCOUNTER — Encounter: Payer: Self-pay | Admitting: Adult Health

## 2023-09-01 VITALS — BP 120/60 | HR 68 | Temp 97.8°F | Ht 62.5 in | Wt 164.0 lb

## 2023-09-01 DIAGNOSIS — M816 Localized osteoporosis [Lequesne]: Secondary | ICD-10-CM

## 2023-09-01 NOTE — Progress Notes (Signed)
Subjective:    Patient ID: Krystal Monroe, female    DOB: 1951-04-28, 73 y.o.   MRN: [id number]  HPI  73 year old female who  has a past medical history of Anxiety, Asthma, Basal cell carcinoma of nose, Colon polyps, Depression, Difficult airway for intubation, initial encounter (10/15/2020), Diverticulosis of colon, Gallstones, GERD (gastroesophageal reflux disease), Hypertension, Obesity, Sleep apnea, and Trigeminal neuralgia.  He presents to the office today for follow-up regarding a recent diagnosis of osteoporosis.  Her most recent bone density scan done on 08/24/2023 showed a T-score of -2.5 in the left femoral neck, this was up from 2.0 in 2018.  Krystal Monroe is taking vitamin D and is going to try taking calcium again, in the past that has caused constipation.  Krystal Monroe is also eating a lot of fruits and vegetables and lean meats.  Krystal Monroe does do a lot of walking and plans on joining a gym to start weight training.  Has a lot of questions about osteoporosis today that Krystal Monroe would like to discuss.  Review of Systems See HPI   Past Medical History:  Diagnosis Date   Anxiety    son killed in MVA   Asthma    as a child   Basal cell carcinoma of nose    Colon polyps    2011   Depression    son killed in MVA   Difficult airway for intubation, initial encounter 10/15/2020   light wand intubation   Diverticulosis of colon    diverticulitits   Gallstones    GERD (gastroesophageal reflux disease)    Hypertension    Obesity    Sleep apnea    mild per pt-no tx   Trigeminal neuralgia     Social History   Socioeconomic History   Marital status: Married    Spouse name: Not on file   Number of children: 3   Years of education: BSN   Highest education level: Bachelor's degree (e.g., BA, AB, BS)  Occupational History   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   Enjoys reading, needlework, painting            Social Drivers of Health   Financial Resource Strain: Low Risk  (09/01/2023)   Overall Financial Resource Strain (CARDIA)    Difficulty of Paying Living Expenses: Not hard at all  Food Insecurity: No Food Insecurity (09/01/2023)   Hunger Vital Sign    Worried About Running Out of Food in the Last Year: Never true    Ran Out of Food in the Last Year: Never true  Transportation Needs: No Transportation Needs (09/01/2023)   PRAPARE - Administrator, Civil Service (Medical):  No    Lack of Transportation (Non-Medical): No  Physical Activity: Sufficiently Active (09/01/2023)   Exercise Vital Sign    Days of Exercise per Week: 6 days    Minutes of Exercise per Session: 30 min  Stress: No Stress Concern Present (11/26/2022)   [employer] of Occupational Health - Occupational Stress Questionnaire    Feeling of Stress : Not at all  Social Connections: Socially Integrated (09/01/2023)   Social Connection and Isolation Panel [NHANES]    Frequency of Communication with Friends and Family: More than three times a week    Frequency of Social Gatherings with Friends and Family: Once a week    Attends Religious Services: 1 to 4 times per year    Active Member of [employer] or Organizations: Yes    Attends Banker Meetings: Never    Marital Status: Married  Catering manager Violence: Not At Risk (11/26/2022)    Humiliation, Afraid, Rape, and Kick questionnaire    Fear of Current or Ex-Partner: No    Emotionally Abused: No    Physically Abused: No    Sexually Abused: No    Past Surgical History:  Procedure Laterality Date   ABDOMINAL HYSTERECTOMY     1997   CATARACT EXTRACTION Left 06/05/2020   per patient   CESAREAN SECTION  1976, 1982, 1984   x 3   CHOLECYSTECTOMY  2002   COLONOSCOPY  last 03/15/2016   EYE SURGERY     ROTATOR CUFF REPAIR Right 2017   TOTAL ABDOMINAL HYSTERECTOMY W/ BILATERAL SALPINGOOPHORECTOMY  2001   UPPER GASTROINTESTINAL ENDOSCOPY      Family History  Problem Relation Age of Onset   Diabetes Maternal Grandmother    Crohn's disease Father    Lung cancer Father    Breast cancer Mother    Osteoporosis Mother    Dementia Mother    Diabetes Mother    Hypertension Mother    Colon cancer Neg Hx    Esophageal cancer Neg Hx    Rectal cancer Neg Hx    Stomach cancer Neg Hx    Colon polyps Neg Hx     Allergies  Allergen Reactions   Iodine-131 Rash   Other Hives, Rash and Other (See Comments)    Magnavist Contrast    Current Outpatient Medications on File Prior to Visit  Medication Sig Dispense Refill   atorvastatin (LIPITOR) 10 MG tablet Take 1 tablet (10 mg total) by mouth daily. 90 tablet 3   cholecalciferol (VITAMIN D3) 25 MCG (1000 UNIT) tablet Take 5,000 Units by mouth daily.     escitalopram (LEXAPRO) 10 MG tablet TAKE ONE TABLET BY MOUTH ONCE DAILY 90 tablet 1   tretinoin (RETIN-A) 0.025 % cream Apply topically at bedtime. 45 g 3   triamterene-hydrochlorothiazide (DYAZIDE) 37.5-25 MG capsule TAKE 1 CAPSULE BY MOUTH EVERY DAY 90 capsule 2   No current facility-administered medications on file prior to visit.    BP 120/60   Pulse 68   Temp 97.8 F (36.6 C) (Oral)   Ht 5' 2.5" (1.588 m)   Wt 164 lb (74.4 kg)   SpO2 96%   BMI 29.52 kg/m       Objective:   Physical Exam Vitals and nursing note reviewed.  Constitutional:      Appearance:  Normal appearance.  Musculoskeletal:        General: Normal range of motion.  Skin:    General: Skin is warm and dry.  Neurological:  General: No focal deficit present.     Mental Status: Krystal Monroe is alert and oriented to person, place, and time.  Psychiatric:        Mood and Affect: Mood normal.        Behavior: Behavior normal.        Thought Content: Thought content normal.        Judgment: Judgment normal.       Assessment & Plan:  1. Localized osteoporosis without current pathological fracture (Primary) -We had an extensive conversation about osteoporosis.  All of the questions that Krystal Monroe had were answered to the best of my ability.  Moving forward we will try to get her approved for Prolia.  Repeat bone density screen in 2 to 3 years.  Shirline Frees, NP  Time spent with patient today was 34 minutes which consisted of chart review, discussing osteoporosis, work up, treatment answering questions and documentation.

## 2023-09-02 ENCOUNTER — Ambulatory Visit (HOSPITAL_BASED_OUTPATIENT_CLINIC_OR_DEPARTMENT_OTHER)
Admission: RE | Admit: 2023-09-02 | Discharge: 2023-09-02 | Disposition: A | Discharge status: Home / Self Care | Payer: Self-pay | Source: Ambulatory Visit | Attending: Adult Health

## 2023-09-02 DIAGNOSIS — E785 Hyperlipidemia, unspecified: Secondary | ICD-10-CM | POA: Insufficient documentation

## 2023-09-06 ENCOUNTER — Telehealth: Payer: Self-pay

## 2023-09-06 NOTE — Telephone Encounter (Signed)
Patient notified of update  and verbalized understanding. 

## 2023-09-06 NOTE — Telephone Encounter (Signed)
Pt ready for scheduling at anytime  Estimated out-of-pocket cost due at time of visit: $315  Primary Insurance: [employer] Prolia co-insurance: 20% (approximately $315)  This summary of benefits is an estimation of the patient's out-of-pocket cost. Exact cost may vary based on individual plan coverage.

## 2023-09-07 ENCOUNTER — Encounter: Payer: Self-pay | Admitting: Adult Health

## 2023-09-07 MED ORDER — VITAMIN D (ERGOCALCIFEROL) 1.25 MG (50000 UNIT) PO CAPS: 50000.0000 [IU] | ORAL_CAPSULE | ORAL | 0 refills | Status: DC

## 2023-09-12 ENCOUNTER — Ambulatory Visit (INDEPENDENT_AMBULATORY_CARE_PROVIDER_SITE_OTHER): Payer: PPO

## 2023-09-12 DIAGNOSIS — M810 Age-related osteoporosis without current pathological fracture: Secondary | ICD-10-CM | POA: Diagnosis not present

## 2023-09-12 DIAGNOSIS — M816 Localized osteoporosis [Lequesne]: Secondary | ICD-10-CM

## 2023-09-12 MED ORDER — DENOSUMAB 60 MG/ML ~~LOC~~ SOSY
60.0000 mg | PREFILLED_SYRINGE | Freq: Once | SUBCUTANEOUS | Status: AC
  Administered 2023-09-12: 60 mg via SUBCUTANEOUS

## 2023-09-12 NOTE — Progress Notes (Signed)
Per orders of Dr. Clent Ridges, injection of prolia given by [facility] on Left Arm. Patient tolerated injection well.

## 2023-09-28 ENCOUNTER — Other Ambulatory Visit: Payer: Self-pay | Admitting: Adult Health

## 2023-09-28 MED ORDER — TRETINOIN 0.025 % EX CREA
TOPICAL_CREAM | Freq: Every day | CUTANEOUS | 3 refills | Status: AC
Start: 2023-09-28 — End: ?

## 2023-11-17 DIAGNOSIS — H04123 Dry eye syndrome of bilateral lacrimal glands: Secondary | ICD-10-CM | POA: Diagnosis not present

## 2023-11-17 DIAGNOSIS — Z961 Presence of intraocular lens: Secondary | ICD-10-CM | POA: Diagnosis not present

## 2023-11-29 ENCOUNTER — Other Ambulatory Visit: Payer: Self-pay

## 2023-11-29 MED ORDER — VITAMIN D (ERGOCALCIFEROL) 1.25 MG (50000 UNIT) PO CAPS: 50000.0000 [IU] | ORAL_CAPSULE | ORAL | 0 refills | Status: DC

## 2024-01-25 DIAGNOSIS — Z1231 Encounter for screening mammogram for malignant neoplasm of breast: Secondary | ICD-10-CM | POA: Diagnosis not present

## 2024-01-25 LAB — HM MAMMOGRAPHY

## 2024-01-26 ENCOUNTER — Encounter: Payer: Self-pay | Admitting: Adult Health

## 2024-02-21 ENCOUNTER — Telehealth: Payer: Self-pay

## 2024-02-21 NOTE — Telephone Encounter (Signed)
 Patient notified of update  and verbalized understanding.

## 2024-02-21 NOTE — Telephone Encounter (Addendum)
 Due on or after 03/11/24.  $0 co-pay, may receive bill from insurance for her portion of the co-insurance.   Buy/bill

## 2024-02-28 ENCOUNTER — Other Ambulatory Visit: Payer: Self-pay | Admitting: Adult Health

## 2024-02-29 ENCOUNTER — Other Ambulatory Visit: Payer: Self-pay | Admitting: Adult Health

## 2024-02-29 DIAGNOSIS — E559 Vitamin D deficiency, unspecified: Secondary | ICD-10-CM

## 2024-02-29 NOTE — Telephone Encounter (Signed)
 Look like pt is scheduled for 03/01/24 already.

## 2024-02-29 NOTE — Telephone Encounter (Signed)
 Okay for refill?

## 2024-03-01 ENCOUNTER — Ambulatory Visit: Admitting: Adult Health

## 2024-03-01 ENCOUNTER — Ambulatory Visit: Payer: Self-pay | Admitting: Adult Health

## 2024-03-01 ENCOUNTER — Other Ambulatory Visit: Payer: Self-pay | Admitting: Adult Health

## 2024-03-01 VITALS — BP 130/80 | HR 61 | Temp 98.1°F | Ht 62.5 in | Wt 165.0 lb

## 2024-03-01 DIAGNOSIS — M816 Localized osteoporosis [Lequesne]: Secondary | ICD-10-CM

## 2024-03-01 DIAGNOSIS — E559 Vitamin D deficiency, unspecified: Secondary | ICD-10-CM | POA: Diagnosis not present

## 2024-03-01 LAB — VITAMIN D 25 HYDROXY (VIT D DEFICIENCY, FRACTURES): VITD: 32.95 ng/mL (ref 30.00–100.00)

## 2024-03-01 MED ORDER — VITAMIN D (ERGOCALCIFEROL) 1.25 MG (50000 UNIT) PO CAPS: 50000.0000 [IU] | ORAL_CAPSULE | ORAL | 1 refills | Status: AC

## 2024-03-01 NOTE — Progress Notes (Signed)
 Subjective:    Patient ID: Krystal Monroe, female    DOB: [birth year], 73 y.o.   MRN: [id number]  HPI 73 year old female who  has a past medical history of Anxiety, Asthma, Basal cell carcinoma of nose, Colon polyps, Depression, Difficult airway for intubation, initial encounter (10/15/2020), Diverticulosis of colon, Gallstones, GERD (gastroesophageal reflux disease), Hypertension, Obesity, Sleep apnea, and Trigeminal neuralgia.  She was diagnosed with osteoporosis with a T-score of -2.5 in her femoral neck in January 2025.  She is currently taking Prolia  shots every 6 months and has tolerated this well.  She was also on high-dose vitamin D  50,000 units weekly which ended last week.  She eats really healthy and is going to the gym multiple days a week doing machines, weightlifting, and aerobic exercise.  She has been seeing a lot of information online about supplements to take for osteoporosis and she is confused if she should be taking any.  She is also wondering if she should start doing Pilates and yoga to help with bone strength.   Review of Systems See HPI   Past Medical History:  Diagnosis Date   Anxiety    son killed in MVA   Asthma    as a child   Basal cell carcinoma of nose    Colon polyps    2011   Depression    son killed in MVA   Difficult airway for intubation, initial encounter 10/15/2020   light wand intubation   Diverticulosis of colon    diverticulitits   Gallstones    GERD (gastroesophageal reflux disease)    Hypertension    Obesity    Sleep apnea    mild per pt-no tx   Trigeminal neuralgia     Social History   Socioeconomic History   Marital status: Married    Spouse name: Not on file   Number of children: 3   Years of education: BSN   Highest education level: Bachelor's degree (e.g., BA, AB, BS)  Occupational History   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   Enjoys reading, needlework, painting            Social Drivers of Health   Financial Resource Strain: Low Risk  (03/01/2024)   Overall Financial Resource Strain (CARDIA)    Difficulty of Paying Living Expenses: Not hard at all  Food Insecurity: No Food Insecurity (03/01/2024)   Hunger Vital Sign    Worried About Running Out of Food in the Last Year: Never true    Ran Out of Food in the Last Year: Never true  Transportation Needs: No Transportation Needs (03/01/2024)   PRAPARE - Transportation    Lack  of Transportation (Medical): No    Lack of Transportation (Non-Medical): No  Physical Activity: Sufficiently Active (03/01/2024)   Exercise Vital Sign    Days of Exercise per Week: 3 days    Minutes of Exercise per Session: 60 min  Stress: Stress Concern Present (03/01/2024)   [employer] of Occupational Health - Occupational Stress Questionnaire    Feeling of Stress: Very much  Social Connections: Moderately Integrated (03/01/2024)   Social Connection and Isolation Panel    Frequency of Communication with Friends and Family: More than three times a week    Frequency of Social Gatherings with Friends and Family: Once a week    Attends Religious Services: Never    Database administrator or Organizations: Yes    Attends Banker Meetings: 1 to 4 times per year    Marital Status: Married  Catering manager  Violence: Not At Risk (11/26/2022)   Humiliation, Afraid, Rape, and Kick questionnaire    Fear of Current or Ex-Partner: No    Emotionally Abused: No    Physically Abused: No    Sexually Abused: No    Past Surgical History:  Procedure Laterality Date   ABDOMINAL HYSTERECTOMY     1997   CATARACT EXTRACTION Left 06/05/2020   per patient   CESAREAN SECTION  1976, 1982, 1984   x 3   CHOLECYSTECTOMY  2002   COLONOSCOPY  last 03/15/2016   EYE SURGERY     ROTATOR CUFF REPAIR Right 2017   TOTAL ABDOMINAL HYSTERECTOMY W/ BILATERAL SALPINGOOPHORECTOMY  2001   UPPER GASTROINTESTINAL ENDOSCOPY      Family History  Problem Relation Age of Onset   Diabetes Maternal Grandmother    Crohn's disease Father    Lung cancer Father    Breast cancer Mother    Osteoporosis Mother    Dementia Mother    Diabetes Mother    Hypertension Mother    Colon cancer Neg Hx    Esophageal cancer Neg Hx    Rectal cancer Neg Hx    Stomach cancer Neg Hx    Colon polyps Neg Hx     Allergies  Allergen Reactions   Iodine-131 Rash   Other Hives, Rash and Other (See Comments)    Magnavist Contrast    Current Outpatient Medications on File Prior to Visit  Medication Sig Dispense Refill   tretinoin  (RETIN-A ) 0.025 % cream Apply topically at bedtime. 45 g 3   triamterene -hydrochlorothiazide  (DYAZIDE) 37.5-25 MG capsule TAKE 1 CAPSULE BY MOUTH EVERY DAY 90 capsule 2   Vitamin D , Ergocalciferol , (DRISDOL ) 1.25 MG (50000 UNIT) CAPS capsule Take 1 capsule (50,000 Units total) by mouth every 7 (seven) days. 12 capsule 0   No current facility-administered medications on file prior to visit.    BP 130/80   Pulse 61   Temp 98.1 F (36.7 C) (Oral)   Ht 5' 2.5 (1.588 m)   Wt 165 lb (74.8 kg)   SpO2 97%   BMI 29.70 kg/m       Objective:   Physical Exam Vitals and nursing note reviewed.  Constitutional:      Appearance: Normal appearance.  Cardiovascular:     Rate and Rhythm: Normal rate and regular  rhythm.     Pulses: Normal pulses.     Heart sounds: Normal heart sounds.  Pulmonary:     Effort: Pulmonary effort is normal.     Breath sounds: Normal breath sounds.  Musculoskeletal:  General: Normal range of motion.  Skin:    General: Skin is warm and dry.  Neurological:     General: No focal deficit present.     Mental Status: She is alert and oriented to person, place, and time.  Psychiatric:        Mood and Affect: Mood normal.        Behavior: Behavior normal.        Thought Content: Thought content normal.        Judgment: Judgment normal.        Assessment & Plan:  1. Localized osteoporosis without current pathological fracture (Primary) - Ianswered all of her questions to the best of my ability. She is doing a wonderful job and I am proud of the changes she has made.   I advised her to continue with bearing exercise, healthy diet, Prolia  every 6 months and that Pilates and yoga would be great for her osteoporosis.  She does not need to take all the supplements that she sees online as these are likely not beneficial. - VITAMIN D  25 Hydroxy (Vit-D Deficiency, Fractures); Future - VITAMIN D  25 Hydroxy (Vit-D Deficiency, Fractures)  2. Vitamin D  deficiency  - VITAMIN D  25 Hydroxy (Vit-D Deficiency, Fractures); Future - VITAMIN D  25 Hydroxy (Vit-D Deficiency, Fractures)  Darleene Shape, NP  Time spent with patient today was 31 minutes which consisted of chart review, discussing osteoporosis, work up, treatment answering questions and documentation.

## 2024-03-12 ENCOUNTER — Ambulatory Visit (INDEPENDENT_AMBULATORY_CARE_PROVIDER_SITE_OTHER): Payer: PPO | Admitting: *Deleted

## 2024-03-12 DIAGNOSIS — M816 Localized osteoporosis [Lequesne]: Secondary | ICD-10-CM | POA: Diagnosis not present

## 2024-03-12 MED ORDER — DENOSUMAB 60 MG/ML ~~LOC~~ SOSY
60.0000 mg | PREFILLED_SYRINGE | Freq: Once | SUBCUTANEOUS | Status: AC
  Administered 2024-03-12: 60 mg via SUBCUTANEOUS

## 2024-03-12 NOTE — Progress Notes (Signed)
Per orders of Dr. Sarajane Jews, injection of Prolia given by Westley Hummer. ?Patient tolerated injection well. ?

## 2024-03-20 ENCOUNTER — Encounter: Admitting: Family Medicine

## 2024-03-23 ENCOUNTER — Encounter: Payer: Self-pay | Admitting: Adult Health

## 2024-03-23 ENCOUNTER — Ambulatory Visit (INDEPENDENT_AMBULATORY_CARE_PROVIDER_SITE_OTHER): Admitting: Adult Health

## 2024-03-23 VITALS — BP 120/80 | HR 69 | Temp 98.0°F | Ht 62.5 in

## 2024-03-23 DIAGNOSIS — T148XXA Other injury of unspecified body region, initial encounter: Secondary | ICD-10-CM

## 2024-03-23 DIAGNOSIS — S29012A Strain of muscle and tendon of back wall of thorax, initial encounter: Secondary | ICD-10-CM

## 2024-03-23 MED ORDER — CYCLOBENZAPRINE HCL 10 MG PO TABS
10.0000 mg | ORAL_TABLET | Freq: Three times a day (TID) | ORAL | 0 refills | Status: DC | PRN
Start: 2024-03-23 — End: 2024-04-10

## 2024-03-23 MED ORDER — METHYLPREDNISOLONE 4 MG PO TBPK
ORAL_TABLET | ORAL | 0 refills | Status: DC
Start: 2024-03-23 — End: 2024-04-10

## 2024-03-23 NOTE — Progress Notes (Signed)
 Subjective:    Patient ID: Krystal Monroe, female    DOB: July 20, 1951, 73 y.o.   MRN: [id number]  HPI 73 year old female who  has a past medical history of Anxiety, Asthma, Basal cell carcinoma of nose, Colon polyps, Depression, Difficult airway for intubation, initial encounter (10/15/2020), Diverticulosis of colon, Gallstones, GERD (gastroesophageal reflux disease), Hypertension, Obesity, Sleep apnea, and Trigeminal neuralgia.  She presents to the office today for an acute issue. She reports having right side mid back pain x 1 week. Symptom started after exercising and she bent over to pick up a 20 lb weight. . At home she has been taking Advil with  relief. Pain feels like a spasm or catch and is more apparent when she changes positions or is walking and turns too quickly to the side.    Review of Systems See HPI   Past Medical History:  Diagnosis Date   Anxiety    son killed in MVA   Asthma    as a child   Basal cell carcinoma of nose    Colon polyps    2011   Depression    son killed in MVA   Difficult airway for intubation, initial encounter 10/15/2020   light wand intubation   Diverticulosis of colon    diverticulitits   Gallstones    GERD (gastroesophageal reflux disease)    Hypertension    Obesity    Sleep apnea    mild per pt-no tx   Trigeminal neuralgia     Social History   Socioeconomic History   Marital status: Married    Spouse name: Not on file   Number of children: 3   Years of education: BSN   Highest education level: Bachelor's degree (e.g., BA, AB, BS)  Occupational History   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   Enjoys reading, needlework, painting            Social Drivers of Health   Financial Resource Strain: Low Risk  (03/01/2024)   Overall Financial Resource Strain (CARDIA)    Difficulty of Paying Living Expenses: Not hard at all  Food Insecurity: No Food Insecurity (03/01/2024)   Hunger Vital Sign    Worried About Running Out of Food in the Last Year: Never true    Ran Out of Food in the Last Year: Never true  Transportation Needs: No Transportation Needs (03/01/2024)   PRAPARE - Administrator, Civil Service (Medical): No    Lack of Transportation (Non-Medical): No  Physical Activity: Sufficiently Active (03/01/2024)   Exercise Vital Sign    Days of Exercise per Week: 3 days    Minutes of Exercise  per Session: 60 min  Stress: Stress Concern Present (03/01/2024)   [employer] of Occupational Health - Occupational Stress Questionnaire    Feeling of Stress: Very much  Social Connections: Moderately Integrated (03/01/2024)   Social Connection and Isolation Panel    Frequency of Communication with Friends and Family: More than three times a week    Frequency of Social Gatherings with Friends and Family: Once a week    Attends Religious Services: Never    Database administrator or Organizations: Yes    Attends Banker Meetings: 1 to 4 times per year    Marital Status: Married  Catering manager Violence: Not At Risk (11/26/2022)   Humiliation, Afraid, Rape, and Kick questionnaire    Fear of Current or Ex-Partner: No    Emotionally Abused: No    Physically Abused: No    Sexually Abused: No    Past Surgical History:  Procedure Laterality  Date   ABDOMINAL HYSTERECTOMY     1997   CATARACT EXTRACTION Left 06/05/2020   per patient   CESAREAN SECTION  1976, 1982, 1984   x 3   CHOLECYSTECTOMY  2002   COLONOSCOPY  last 03/15/2016   EYE SURGERY     ROTATOR CUFF REPAIR Right 2017   TOTAL ABDOMINAL HYSTERECTOMY W/ BILATERAL SALPINGOOPHORECTOMY  2001   UPPER GASTROINTESTINAL ENDOSCOPY      Family History  Problem Relation Age of Onset   Diabetes Maternal Grandmother    Crohn's disease Father    Lung cancer Father    Breast cancer Mother    Osteoporosis Mother    Dementia Mother    Diabetes Mother    Hypertension Mother    Colon cancer Neg Hx    Esophageal cancer Neg Hx    Rectal cancer Neg Hx    Stomach cancer Neg Hx    Colon polyps Neg Hx     Allergies  Allergen Reactions   Iodine-131 Rash   Other Hives, Rash and Other (See Comments)    Magnavist Contrast    Current Outpatient Medications on File Prior to Visit  Medication Sig Dispense Refill   tretinoin  (RETIN-A ) 0.025 % cream Apply topically at bedtime. 45 g 3   triamterene -hydrochlorothiazide  (DYAZIDE) 37.5-25 MG capsule TAKE 1 CAPSULE BY MOUTH EVERY DAY 90 capsule 2   Vitamin D , Ergocalciferol , (DRISDOL ) 1.25 MG (50000 UNIT) CAPS capsule Take 1 capsule (50,000 Units total) by mouth every 7 (seven) days. 12 capsule 1   No current facility-administered medications on file prior to visit.    BP 120/80   Pulse 69   Temp 98 F (36.7 C) (Oral)   Ht 5' 2.5 (1.588 m)   SpO2 96%   BMI 29.70 kg/m       Objective:   Physical Exam Vitals and nursing note reviewed.  Constitutional:      Appearance: Normal appearance.  Musculoskeletal:       Back:  Skin:    General: Skin is warm and dry.  Neurological:     General: No focal deficit present.     Mental Status: She is alert and oriented to person, place, and time.  Psychiatric:        Mood and Affect: Mood normal.        Behavior: Behavior normal.        Thought Content: Thought content normal.         Judgment: Judgment normal.        Assessment & Plan:  1. Muscle strain (Primary) - Will treat with medrol  dose pack and muscle relaxer. She knows that the muscle relaxer can cause drowsiness - Follow up if not improving in the next 3-4 days - methylPREDNISolone  (MEDROL  DOSEPAK) 4 MG TBPK tablet; Take as directed  Dispense: 21 tablet; Refill: 0 - cyclobenzaprine  (FLEXERIL ) 10 MG tablet; Take 1 tablet (10 mg total) by mouth 3 (three) times daily as needed for muscle spasms.  Dispense: 30 tablet; Refill: 0   Darleene Shape, NP

## 2024-04-01 ENCOUNTER — Emergency Department (HOSPITAL_BASED_OUTPATIENT_CLINIC_OR_DEPARTMENT_OTHER)

## 2024-04-01 ENCOUNTER — Other Ambulatory Visit: Payer: Self-pay

## 2024-04-01 ENCOUNTER — Emergency Department (HOSPITAL_BASED_OUTPATIENT_CLINIC_OR_DEPARTMENT_OTHER)
Admission: EM | Admit: 2024-04-01 | Discharge: 2024-04-01 | Disposition: A | Discharge status: Home / Self Care | Attending: Emergency Medicine | Admitting: Emergency Medicine

## 2024-04-01 DIAGNOSIS — R161 Splenomegaly, not elsewhere classified: Secondary | ICD-10-CM | POA: Diagnosis not present

## 2024-04-01 DIAGNOSIS — K5732 Diverticulitis of large intestine without perforation or abscess without bleeding: Secondary | ICD-10-CM | POA: Diagnosis not present

## 2024-04-01 DIAGNOSIS — K2970 Gastritis, unspecified, without bleeding: Secondary | ICD-10-CM | POA: Insufficient documentation

## 2024-04-01 DIAGNOSIS — R109 Unspecified abdominal pain: Secondary | ICD-10-CM | POA: Diagnosis present

## 2024-04-01 DIAGNOSIS — K5792 Diverticulitis of intestine, part unspecified, without perforation or abscess without bleeding: Secondary | ICD-10-CM | POA: Diagnosis not present

## 2024-04-01 DIAGNOSIS — I10 Essential (primary) hypertension: Secondary | ICD-10-CM | POA: Diagnosis not present

## 2024-04-01 DIAGNOSIS — K449 Diaphragmatic hernia without obstruction or gangrene: Secondary | ICD-10-CM | POA: Diagnosis not present

## 2024-04-01 LAB — COMPREHENSIVE METABOLIC PANEL WITH GFR
ALT: 28 U/L (ref 0–44)
AST: 40 U/L (ref 15–41)
Albumin: 4.3 g/dL (ref 3.5–5.0)
Alkaline Phosphatase: 97 U/L (ref 38–126)
Anion gap: 14 (ref 5–15)
BUN: 17 mg/dL (ref 8–23)
CO2: 20 mmol/L — ABNORMAL LOW (ref 22–32)
Calcium: 8.7 mg/dL — ABNORMAL LOW (ref 8.9–10.3)
Chloride: 97 mmol/L — ABNORMAL LOW (ref 98–111)
Creatinine, Ser: 0.66 mg/dL (ref 0.44–1.00)
GFR, Estimated: >60 mL/min (ref >60)
Glucose, Bld: 156 mg/dL — ABNORMAL HIGH (ref 70–99)
Potassium: 3.6 mmol/L (ref 3.5–5.1)
Sodium: 132 mmol/L — ABNORMAL LOW (ref 135–145)
Total Bilirubin: 1.2 mg/dL (ref 0.0–1.2)
Total Protein: 7.2 g/dL (ref 6.5–8.1)

## 2024-04-01 LAB — CBC
HCT: 44.2 % (ref 36.0–46.0)
Hemoglobin: 15.1 g/dL — ABNORMAL HIGH (ref 12.0–15.0)
MCH: 28.8 pg (ref 26.0–34.0)
MCHC: 34.2 g/dL (ref 30.0–36.0)
MCV: 84.2 fL (ref 80.0–100.0)
Platelets: 197 K/uL (ref 150–400)
RBC: 5.25 MIL/uL — ABNORMAL HIGH (ref 3.87–5.11)
RDW: 13.3 % (ref 11.5–15.5)
WBC: 10.1 K/uL (ref 4.0–10.5)
nRBC: 0 % (ref 0.0–0.2)

## 2024-04-01 LAB — LIPASE, BLOOD: Lipase: 31 U/L (ref 11–51)

## 2024-04-01 MED ORDER — PANTOPRAZOLE SODIUM 40 MG IV SOLR
40.0000 mg | Freq: Once | INTRAVENOUS | Status: AC
  Administered 2024-04-01: 40 mg via INTRAVENOUS
  Filled 2024-04-01: qty 10

## 2024-04-01 MED ORDER — SODIUM CHLORIDE 0.9 % IV BOLUS
1000.0000 mL | Freq: Once | INTRAVENOUS | Status: AC
  Administered 2024-04-01: 1000 mL via INTRAVENOUS

## 2024-04-01 MED ORDER — PANTOPRAZOLE SODIUM 20 MG PO TBEC: 20.0000 mg | DELAYED_RELEASE_TABLET | Freq: Every day | ORAL | 0 refills | Status: DC

## 2024-04-01 MED ORDER — ONDANSETRON 4 MG PO TBDP: 4.0000 mg | ORAL_TABLET | Freq: Three times a day (TID) | ORAL | 0 refills | Status: DC | PRN

## 2024-04-01 MED ORDER — FENTANYL CITRATE PF 50 MCG/ML IJ SOSY: 50.0000 ug | PREFILLED_SYRINGE | Freq: Once | INTRAMUSCULAR | Status: DC

## 2024-04-01 MED ORDER — ONDANSETRON 4 MG PO TBDP
4.0000 mg | ORAL_TABLET | Freq: Once | ORAL | Status: AC | PRN
  Administered 2024-04-01: 4 mg via ORAL
  Filled 2024-04-01: qty 1

## 2024-04-01 MED ORDER — AMOXICILLIN-POT CLAVULANATE 875-125 MG PO TABS: 1.0000 | ORAL_TABLET | Freq: Two times a day (BID) | ORAL | 0 refills | Status: DC

## 2024-04-01 MED ORDER — AMOXICILLIN-POT CLAVULANATE 875-125 MG PO TABS
1.0000 | ORAL_TABLET | Freq: Once | ORAL | Status: AC
  Administered 2024-04-01: 1 via ORAL
  Filled 2024-04-01: qty 1

## 2024-04-01 MED ORDER — ONDANSETRON HCL 4 MG/2ML IJ SOLN
4.0000 mg | Freq: Once | INTRAMUSCULAR | Status: DC
  Filled 2024-04-01: qty 2

## 2024-04-01 NOTE — Discharge Instructions (Addendum)
 Please return if symptoms worsen.  Take antibiotics as prescribed.  Take Zofran  as needed for nausea and vomiting.  Take Protonix  as prescribed.

## 2024-04-01 NOTE — ED Provider Notes (Signed)
 [place] [facility] DEPARTMENT AT [facility] Provider Note   CSN: [id number] Arrival date & time: 04/01/24  1330     Patient presents with: Abdominal Pain   Krystal Monroe is a 73 y.o. female.   Patient having nausea diarrhea abdominal pain.  She has been dealing with some back spasms here recently.  Just finished a course of prednisone.  Now started with some nausea diarrhea the last 2 days.  Pain in the upper abdomen.  She has had prior gallbladder removal.  Not having any pain with urination.  Denies any chest pain shortness of breath weakness numbness tingling.  No obvious suspicious food intake.  The history is provided by the patient.       Prior to Admission medications   Medication Sig Start Date End Date Taking? Authorizing Provider  amoxicillin -clavulanate (AUGMENTIN ) 875-125 MG tablet Take 1 tablet by mouth every 12 (twelve) hours. 04/01/24  Yes Kahli Fitzgerald, DO  ondansetron  (ZOFRAN -ODT) 4 MG disintegrating tablet Take 1 tablet (4 mg total) by mouth every 8 (eight) hours as needed. 04/01/24  Yes Anwen Cannedy, DO  pantoprazole  (PROTONIX ) 20 MG tablet Take 1 tablet (20 mg total) by mouth daily for 14 days. 04/01/24 04/15/24 Yes Abagale Boulos, DO  cyclobenzaprine  (FLEXERIL ) 10 MG tablet Take 1 tablet (10 mg total) by mouth 3 (three) times daily as needed for muscle spasms. 03/23/24   Nafziger, Darleene, NP  methylPREDNISolone  (MEDROL  DOSEPAK) 4 MG TBPK tablet Take as directed 03/23/24   Nafziger, Darleene, NP  tretinoin  (RETIN-A ) 0.025 % cream Apply topically at bedtime. 09/28/23   Nafziger, Darleene, NP  triamterene -hydrochlorothiazide  (DYAZIDE) 37.5-25 MG capsule TAKE 1 CAPSULE BY MOUTH EVERY DAY 07/12/23   Nafziger, Darleene, NP  Vitamin D , Ergocalciferol , (DRISDOL ) 1.25 MG (50000 UNIT) CAPS capsule Take 1 capsule (50,000 Units total) by mouth every 7 (seven) days. 03/01/24 05/30/24  Nafziger, Darleene, NP    Allergies: Iodine-131 and Other    Review of Systems  Updated Vital  Signs BP 108/62   Pulse 65   Temp 97.9 F (36.6 C)   Resp 11   Ht 5' 3.5 (1.613 m)   Wt 71.7 kg   SpO2 97%   BMI 27.55 kg/m   Physical Exam Vitals and nursing note reviewed.  Constitutional:      General: She is not in acute distress.    Appearance: She is well-developed. She is not ill-appearing.  HENT:     Head: Normocephalic and atraumatic.  Eyes:     Extraocular Movements: Extraocular movements intact.     Conjunctiva/sclera: Conjunctivae normal.     Pupils: Pupils are equal, round, and reactive to light.  Cardiovascular:     Rate and Rhythm: Normal rate and regular rhythm.     Heart sounds: Normal heart sounds. No murmur heard. Pulmonary:     Effort: Pulmonary effort is normal. No respiratory distress.     Breath sounds: Normal breath sounds.  Abdominal:     General: Abdomen is flat.     Palpations: Abdomen is soft.     Tenderness: There is generalized abdominal tenderness.  Musculoskeletal:        General: No swelling.     Cervical back: Neck supple.  Skin:    General: Skin is warm and dry.     Capillary Refill: Capillary refill takes less than 2 seconds.  Neurological:     Mental Status: She is alert.  Psychiatric:        Mood and Affect: Mood normal.     (  all labs ordered are listed, but only abnormal results are displayed) Labs Reviewed  COMPREHENSIVE METABOLIC PANEL WITH GFR - Abnormal; Notable for the following components:      Result Value   Sodium 132 (*)    Chloride 97 (*)    CO2 20 (*)    Glucose, Bld 156 (*)    Calcium  8.7 (*)    All other components within normal limits  CBC - Abnormal; Notable for the following components:   RBC 5.25 (*)    Hemoglobin 15.1 (*)    All other components within normal limits  LIPASE, BLOOD    EKG: EKG Interpretation Date/Time:  Sunday April 01 2024 13:45:02 EDT Ventricular Rate:  77 PR Interval:  166 QRS Duration:  90 QT Interval:  418 QTC Calculation: 473 R Axis:   -41  Text  Interpretation: Normal sinus rhythm Left axis deviation Low voltage QRS When compared with ECG of 25-Dec-2016 13:49, PREVIOUS ECG IS PRESENT Confirmed by Ruthe Cornet [phone]) on 04/01/2024 2:58:41 PM  Radiology: CT ABDOMEN PELVIS WO CONTRAST Result Date: 04/01/2024 CLINICAL DATA:  Abdominal pain, acute. EXAM: CT ABDOMEN AND PELVIS WITHOUT CONTRAST TECHNIQUE: Multidetector CT imaging of the abdomen and pelvis was performed following the standard protocol without IV contrast. RADIATION DOSE REDUCTION: This exam was performed according to the departmental dose-optimization program which includes automated exposure control, adjustment of the mA and/or kV according to patient size and/or use of iterative reconstruction technique. COMPARISON:  Remote CT 12/01/2012 FINDINGS: Lower chest: The lung bases are clear. Hepatobiliary: Mild diffuse hepatic steatosis. No evidence of focal liver abnormality on this unenhanced exam. Clips in the gallbladder fossa postcholecystectomy. No biliary dilatation. Pancreas: No ductal dilatation or inflammation. Spleen: The spleen is enlarged, 14.5 cm AP. No focal abnormality on this unenhanced exam. Adrenals/Urinary Tract: No adrenal nodule. No hydronephrosis or renal calculi. No evidence of renal a flow may shin. The ureters are decompressed. There are multiple retroperitoneal phleboliths that are outside the course of the ureters. The urinary bladder is nondistended. Stomach/Bowel: Small hiatal hernia with mild wall thickening of the distal esophagus. The stomach is otherwise unremarkable. Minimal fluid within small bowel but no wall thickening, obstruction or inflammation. Normal appendix. Inflamed diverticulum with at the junction of the descending and sigmoid colon, series 2, image 69. There is wall thickening of the adjacent colon and pericolonic fat stranding. No perforation or abscess. Additional noninflamed diverticula in the left colon. Vascular/Lymphatic: Aortic  atherosclerosis. No aortic aneurysm. No bulky abdominopelvic adenopathy. Reproductive: Status post hysterectomy. No adnexal masses. Other: No free air, free fluid, or intra-abdominal fluid collection. Musculoskeletal: Lower lumbar facet hypertrophy. Degenerative change of the pubic symphysis. The bones are subjectively under mineralized. No acute osseous findings. IMPRESSION: 1. Acute uncomplicated diverticulitis at the junction of the descending and sigmoid colon. 2. Small hiatal hernia with mild wall thickening of the distal esophagus, can be seen with reflux or esophagitis. 3. Mild hepatic steatosis. 4. Splenomegaly. Aortic Atherosclerosis (ICD10-I70.0). Electronically Signed   By: Andrea Gasman M.D.   On: 04/01/2024 16:06     Procedures   Medications Ordered in the ED  ondansetron  (ZOFRAN ) injection 4 mg (4 mg Intravenous Not Given 04/01/24 1517)  ondansetron  (ZOFRAN -ODT) disintegrating tablet 4 mg (4 mg Oral Given 04/01/24 1345)  sodium chloride  0.9 % bolus 1,000 mL (0 mLs Intravenous Stopped 04/01/24 1630)  pantoprazole  (PROTONIX ) injection 40 mg (40 mg Intravenous Given 04/01/24 1514)  amoxicillin -clavulanate (AUGMENTIN ) 875-125 MG per tablet 1 tablet (1 tablet Oral Given  04/01/24 1635)                                    Medical Decision Making Amount and/or Complexity of Data Reviewed Labs: ordered. Radiology: ordered.  Risk Prescription drug management.   TANASIA BUDZINSKI is here with nausea diarrhea.  Abdominal pain as well.  Normal vitals.  No fever.  No significant major medical problems.  Differential diagnosis colitis versus less likely pancreatitis bowel obstruction.  Sounds like she has had symptoms after finishing prednisone for muscle spasms.  Could be gastritis as she does have hiatal hernia history.  Will give IV Protonix  Zofran  fluids check basic labs get a CT scan abdomen pelvis.  EKG shows sinus rhythm.  No ischemic changes.  I doubt any cardiac or pulmonary process at  this time.  No respiratory symptoms.  Doubt PE.  Per my review interpretation labs thus far no significant leukocytosis anemia or electrolyte abnormality kidney injury.  Awaiting urinalysis CT scan abdomen pelvis.  CT scan shows diverticulitis and esophagitis gastritis.  Ultimately will treat with Augmentin  Zofran  Protonix  have her follow-up with her primary care doctor.  Discharged in good condition.  Understands return precautions.  Feeling better after nausea meds and fluids.  This chart was dictated using voice recognition software.  Despite best efforts to proofread,  errors can occur which can change the documentation meaning.      Final diagnoses:  Diverticulitis  Gastritis without bleeding, unspecified chronicity, unspecified gastritis type    ED Discharge Orders          Ordered    pantoprazole  (PROTONIX ) 20 MG tablet  Daily        04/01/24 1637    amoxicillin -clavulanate (AUGMENTIN ) 875-125 MG tablet  Every 12 hours        04/01/24 1637    ondansetron  (ZOFRAN -ODT) 4 MG disintegrating tablet  Every 8 hours PRN        04/01/24 1637               Corretta Munce, DO 04/01/24 1640

## 2024-04-01 NOTE — ED Notes (Signed)
 Unable to provide urine at this time, will try again at a later time.

## 2024-04-01 NOTE — ED Notes (Signed)
 Pt aware of the need for a urine... Pt unable to currently provide a sample.SABRASABRA

## 2024-04-01 NOTE — ED Triage Notes (Signed)
 Pt POV reporting upper abd pain and nausea that began two days ago following completed course of prednisone due to muscle spasms in back. Denies emesis, endorses diarrhea.

## 2024-04-01 NOTE — ED Notes (Signed)
 DC paperwork given an verbally understood.

## 2024-04-10 ENCOUNTER — Ambulatory Visit (INDEPENDENT_AMBULATORY_CARE_PROVIDER_SITE_OTHER): Admitting: Adult Health

## 2024-04-10 ENCOUNTER — Encounter: Payer: Self-pay | Admitting: Adult Health

## 2024-04-10 VITALS — BP 110/60 | HR 71 | Temp 98.0°F | Ht 63.5 in | Wt 163.0 lb

## 2024-04-10 DIAGNOSIS — K2100 Gastro-esophageal reflux disease with esophagitis, without bleeding: Secondary | ICD-10-CM | POA: Diagnosis not present

## 2024-04-10 DIAGNOSIS — K5792 Diverticulitis of intestine, part unspecified, without perforation or abscess without bleeding: Secondary | ICD-10-CM

## 2024-04-10 DIAGNOSIS — Z8719 Personal history of other diseases of the digestive system: Secondary | ICD-10-CM | POA: Diagnosis not present

## 2024-04-10 DIAGNOSIS — R161 Splenomegaly, not elsewhere classified: Secondary | ICD-10-CM

## 2024-04-10 MED ORDER — PANTOPRAZOLE SODIUM 20 MG PO TBEC: 20.0000 mg | DELAYED_RELEASE_TABLET | Freq: Every day | ORAL | 0 refills | Status: DC

## 2024-04-10 NOTE — Progress Notes (Signed)
 Subjective:    Patient ID: Krystal Monroe, female    DOB: 04/23/1951, 73 y.o.   MRN: [id number]  HPI 73 year old female who  has a past medical history of Anxiety, Asthma, Basal cell carcinoma of nose, Colon polyps, Depression, Difficult airway for intubation, initial encounter (10/15/2020), Diverticulosis of colon, Gallstones, GERD (gastroesophageal reflux disease), Hypertension, Obesity, Sleep apnea, and Trigeminal neuralgia.  She presents to the office today for follow-up after being seen in the emergency room on 04/01/2024.  She presented with abdominal pain, nausea, and diarrhea since 2 days.  She denied pain with urination, chest pain, shortness of breath, weakness, numbness, or tingling.  ER her EKG showed sinus rhythm.  CT showed:  IMPRESSION: 1. Acute uncomplicated diverticulitis at the junction of the descending and sigmoid colon. 2. Small hiatal hernia with mild wall thickening of the distal esophagus, can be seen with reflux or esophagitis. 3. Mild hepatic steatosis. 4. Splenomegaly. She was treated with Augmentin , Zofran , and Protonix .  Today she reports that she no longer is having abd pain, denies pressure or feeling full on her left side and is feeling much better overall. She continues to have mild back pain but this has improved.   Review of Systems See HPI   Past Medical History:  Diagnosis Date   Anxiety    son killed in MVA   Asthma    as a child   Basal cell carcinoma of nose    Colon polyps    2011   Depression    son killed in MVA   Difficult airway for intubation, initial encounter 10/15/2020   light wand intubation   Diverticulosis of colon    diverticulitits   Gallstones    GERD (gastroesophageal reflux disease)    Hypertension    Obesity    Sleep apnea    mild per pt-no tx   Trigeminal neuralgia     Social History   Socioeconomic History   Marital status: Married    Spouse name: Not on file   Number of children: 3   Years of  education: BSN   Highest education level: Bachelor's degree (e.g., BA, AB, BS)  Occupational History   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   Enjoys reading, needlework, painting            Social Drivers of Health   Financial Resource Strain: Low Risk  (03/01/2024)   Overall Financial Resource Strain (CARDIA)    Difficulty of Paying Living Expenses: Not hard at all  Food Insecurity: No Food Insecurity (03/01/2024)   Hunger Vital Sign    Worried About Running Out of Food in the Last Year: Never true  Ran Out of Food in the Last Year: Never true  Transportation Needs: No Transportation Needs (03/01/2024)   PRAPARE - Administrator, Civil Service (Medical): No    Lack of Transportation (Non-Medical): No  Physical Activity: Sufficiently Active (03/01/2024)   Exercise Vital Sign    Days of Exercise per Week: 3 days    Minutes of Exercise per Session: 60 min  Stress: Stress Concern Present (03/01/2024)   [employer] of Occupational Health - Occupational Stress Questionnaire    Feeling of Stress: Very much  Social Connections: Moderately Integrated (03/01/2024)   Social Connection and Isolation Panel    Frequency of Communication with Friends and Family: More than three times a week    Frequency of Social Gatherings with  Friends and Family: Once a week    Attends Religious Services: Never    Database administrator or Organizations: Yes    Attends Banker Meetings: 1 to 4 times per year    Marital Status: Married  Catering manager Violence: Not At Risk (11/26/2022)   Humiliation, Afraid, Rape, and Kick questionnaire    Fear of Current or Ex-Partner: No    Emotionally Abused: No    Physically Abused: No    Sexually Abused: No    Past Surgical History:  Procedure Laterality Date   ABDOMINAL HYSTERECTOMY     1997   CATARACT EXTRACTION Left 06/05/2020   per patient   CESAREAN SECTION  1976, 1982, 1984   x 3   CHOLECYSTECTOMY  2002   COLONOSCOPY  last 03/15/2016   EYE SURGERY     ROTATOR CUFF REPAIR Right 2017   TOTAL ABDOMINAL HYSTERECTOMY W/ BILATERAL SALPINGOOPHORECTOMY  2001   UPPER GASTROINTESTINAL ENDOSCOPY      Family History  Problem Relation Age of Onset   Diabetes Maternal Grandmother    Crohn's disease Father    Lung cancer Father    Breast cancer Mother    Osteoporosis Mother    Dementia Mother    Diabetes Mother    Hypertension Mother    Colon cancer Neg Hx    Esophageal cancer Neg Hx    Rectal cancer Neg Hx    Stomach cancer Neg Hx    Colon polyps Neg Hx     Allergies  Allergen Reactions   Iodine-131 Rash   Other Hives, Rash and Other (See Comments)    Magnavist Contrast    Current Outpatient Medications on File Prior to Visit  Medication Sig Dispense Refill   ondansetron  (ZOFRAN -ODT) 4 MG disintegrating tablet Take 1 tablet (4 mg total) by mouth every 8 (eight) hours as needed. 20 tablet 0   pantoprazole  (PROTONIX ) 20 MG tablet Take 1 tablet (20 mg total) by mouth daily for 14 days. 14 tablet 0   tretinoin  (RETIN-A ) 0.025 % cream Apply topically at bedtime. 45 g 3   triamterene -hydrochlorothiazide  (DYAZIDE) 37.5-25 MG capsule TAKE 1 CAPSULE BY MOUTH EVERY DAY 90 capsule 2   Vitamin D , Ergocalciferol , (DRISDOL ) 1.25 MG (50000 UNIT) CAPS capsule Take 1  capsule (50,000 Units total) by mouth every 7 (seven) days. 12 capsule 1   amoxicillin -clavulanate (AUGMENTIN ) 875-125 MG tablet Take 1 tablet by mouth every 12 (twelve) hours. 14 tablet 0   cyclobenzaprine  (FLEXERIL ) 10 MG tablet Take 1 tablet (10 mg total) by mouth 3 (three) times daily as needed for muscle spasms. 30 tablet 0   methylPREDNISolone  (MEDROL  DOSEPAK) 4 MG TBPK tablet Take as directed 21 tablet 0  No current facility-administered medications on file prior to visit.    BP 110/60   Pulse 71   Temp 98 F (36.7 C) (Oral)   Ht 5' 3.5 (1.613 m)   Wt 163 lb (73.9 kg)   SpO2 96%   BMI 28.42 kg/m       Objective:   Physical Exam Vitals and nursing note reviewed.  Constitutional:      Appearance: Normal appearance.  Cardiovascular:     Rate and Rhythm: Normal rate and regular rhythm.     Pulses: Normal pulses.     Heart sounds: Normal heart sounds.  Pulmonary:     Effort: Pulmonary effort is normal.     Breath sounds: Normal breath sounds.  Abdominal:     General: Abdomen is flat. Bowel sounds are normal. There is no distension.     Palpations: Abdomen is soft. There is no mass.     Tenderness: There is no abdominal tenderness.  Neurological:     Mental Status: She is alert.        Assessment & Plan:  1. Acute diverticulitis (Primary) - Has resolved with abx therapy.   2. Gastroesophageal reflux disease with esophagitis without hemorrhage - Will keep her on Protonix  for the next 90 days and see if this has improved on follow up CT.  - May need referral to GI  - pantoprazole  (PROTONIX ) 20 MG tablet; Take 1 tablet (20 mg total) by mouth daily.  Dispense: 90 tablet; Refill: 0  3. Splenomegaly - She was not anemic with ER labs. Possibly related to infection. Will repeat CT scan in about a month - CT ABDOMEN PELVIS WO CONTRAST; Future  Darleene Shape, NP

## 2024-04-27 ENCOUNTER — Ambulatory Visit

## 2024-04-27 VITALS — Ht 63.5 in | Wt 163.0 lb

## 2024-04-27 DIAGNOSIS — Z0000 Encounter for general adult medical examination without abnormal findings: Secondary | ICD-10-CM

## 2024-04-27 NOTE — Progress Notes (Signed)
 Subjective:   Krystal Monroe is a 73 y.o. who presents for a Medicare Wellness preventive visit.  As a reminder, Annual Wellness Visits don't include a physical exam, and some assessments may be limited, especially if this visit is performed virtually. We may recommend an in-person follow-up visit with your provider if needed.  Visit Complete: Virtual I connected with  Krystal Monroe on 04/27/24 by a audio enabled telemedicine application and verified that I am speaking with the correct person using two identifiers.  Patient Location: Home  Provider Location: Home Office  I discussed the limitations of evaluation and management by telemedicine. The patient expressed understanding and agreed to proceed.  Vital Signs: Because this visit was a virtual/telehealth visit, some criteria may be missing or patient reported. Any vitals not documented were not able to be obtained and vitals that have been documented are patient reported.    Persons Participating in Visit: Patient.  AWV Questionnaire: No: Patient Medicare AWV questionnaire was not completed prior to this visit.  Cardiac Risk Factors include: advanced age (>63men, >9 women);hypertension     Objective:    Today's Vitals   04/27/24 1535  Weight: 163 lb (73.9 kg)  Height: 5' 3.5 (1.613 m)   Body mass index is 28.42 kg/m.     04/27/2024    3:41 PM 04/01/2024    1:42 PM 11/26/2022   10:26 AM 01/18/2022   10:35 AM 01/14/2021    2:51 PM 10/19/2018   10:29 AM 01/17/2017   10:28 AM  Advanced Directives  Does Patient Have a Medical Advance Directive? Yes Yes Yes Yes Yes Yes  No   Type of Estate agent of [place];Living will Healthcare Power of [place];Living will Healthcare Power of [place];Living will Healthcare Power of [place];Living will Healthcare Power of [place];Living will Healthcare Power of [place];Living will   Does patient want to make changes to medical advance directive?      No - Patient  declined    Copy of Healthcare Power of Attorney in Chart? No - copy requested  No - copy requested No - copy requested No - copy requested No - copy requested    Would patient like information on creating a medical advance directive?       No - Patient declined      Data saved with a previous flowsheet row definition    Current Medications (verified) Outpatient Encounter Medications as of 04/27/2024  Medication Sig   pantoprazole  (PROTONIX ) 20 MG tablet Take 1 tablet (20 mg total) by mouth daily.   tretinoin  (RETIN-A ) 0.025 % cream Apply topically at bedtime.   triamterene -hydrochlorothiazide  (DYAZIDE) 37.5-25 MG capsule TAKE 1 CAPSULE BY MOUTH EVERY DAY   Vitamin D , Ergocalciferol , (DRISDOL ) 1.25 MG (50000 UNIT) CAPS capsule Take 1 capsule (50,000 Units total) by mouth every 7 (seven) days.   No facility-administered encounter medications on file as of 04/27/2024.    Allergies (verified) Iodine-131 and Other   History: Past Medical History:  Diagnosis Date   Anxiety    son killed in MVA   Asthma    as a child   Basal cell carcinoma of nose    Colon polyps    2011   Depression    son killed in MVA   Difficult airway for intubation, initial encounter 10/15/2020   light wand intubation   Diverticulosis of colon    diverticulitits   Gallstones    GERD (gastroesophageal reflux disease)    Hypertension  Obesity    Sleep apnea    mild per pt-no tx   Trigeminal neuralgia    Past Surgical History:  Procedure Laterality Date   ABDOMINAL HYSTERECTOMY     1997   CATARACT EXTRACTION Left 06/05/2020   per patient   CESAREAN SECTION  1976, 1982, 1984   x 3   CHOLECYSTECTOMY  2002   COLONOSCOPY  last 03/15/2016   EYE SURGERY     ROTATOR CUFF REPAIR Right 2017   TOTAL ABDOMINAL HYSTERECTOMY W/ BILATERAL SALPINGOOPHORECTOMY  2001   UPPER GASTROINTESTINAL ENDOSCOPY     Family History  Problem Relation Age of Onset   Diabetes Maternal Grandmother    Crohn's disease Father     Lung cancer Father    Breast cancer Mother    Osteoporosis Mother    Dementia Mother    Diabetes Mother    Hypertension Mother    Colon cancer Neg Hx    Esophageal cancer Neg Hx    Rectal cancer Neg Hx    Stomach cancer Neg Hx    Colon polyps Neg Hx    Social History   Socioeconomic History   Marital status: Married    Spouse name: Not on file   Number of children: 3   Years of education: BSN   Highest education level: Bachelor's degree (e.g., BA, AB, BS)  Occupational History   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   Enjoys reading, needlework, painting            Social Drivers of Health   Financial Resource Strain: Low Risk  (04/27/2024)   Overall Financial Resource Strain (CARDIA)    Difficulty of Paying Living Expenses: Not hard at all  Food Insecurity: No Food Insecurity (04/27/2024)   Hunger Vital Sign    Worried About Running Out of Food in the Last Year: Never true    Ran Out of Food in the Last Year: Never true  Transportation Needs: No Transportation Needs (04/27/2024)   PRAPARE - Administrator, Civil Service (Medical): No    Lack of Transportation (Non-Medical): No  Physical Activity: Sufficiently Active (04/27/2024)   Exercise Vital Sign    Days of Exercise per Week: 3 days    Minutes of Exercise  per Session: 60 min  Stress: No Stress Concern Present (04/27/2024)   [employer] of Occupational Health - Occupational Stress Questionnaire    Feeling of Stress: Not at all  Recent Concern: Stress - Stress Concern Present (03/01/2024)   [employer] of Occupational Health - Occupational Stress Questionnaire    Feeling of Stress: Very much  Social Connections: Socially Integrated (04/27/2024)   Social Connection and Isolation Panel  Frequency of Communication with Friends and Family: More than three times a week    Frequency of Social Gatherings with Friends and Family: More than three times a week    Attends Religious Services: More than 4 times per year    Active Member of [employer] or Organizations: Yes    Attends Engineer, structural: More than 4 times per year    Marital Status: Married    Tobacco Counseling Counseling given: Not Answered    Clinical Intake:  Pre-visit preparation completed: Yes  Pain : No/denies pain     BMI - recorded: 28.42 Nutritional Status: BMI 25 -29 Overweight Nutritional Risks: None Diabetes: No  Lab Results  Component Value Date   HGBA1C 5.9 07/08/2023   HGBA1C 6.3 07/06/2022   HGBA1C 6.1 07/03/2021     How often do you need to have someone help you when you read instructions, pamphlets, or other written materials from your doctor or pharmacy?: 1 - Never  Interpreter Needed?: No  Information entered by :: Rojelio Blush LPN   Activities of Daily Living      04/27/2024    3:40 PM 07/08/2023    7:55 AM  In your present state of health, do you have any difficulty performing the following activities:  Hearing? 0 0  Vision? 0 0  Difficulty concentrating or making decisions? 0 0  Walking or climbing stairs? 0 0  Dressing or bathing? 0 0  Doing errands, shopping? 0 0  Preparing Food and eating ? N   Using the Toilet? N   In the past six months, have you accidently leaked urine? N   Do you have problems with loss  of bowel control? N   Managing your Medications? N   Managing your Finances? N   Housekeeping or managing your Housekeeping? N     Patient Care Team: Merna Huxley, NP as PCP - General (Family Medicine) Dohmeier, Dedra, MD as Consulting Physician (Neurology) Waylan Cain, MD as Consulting Physician (Ophthalmology) Liane Sharyne MATSU, [facility] (Inactive) as Pharmacist (Pharmacist)  I have updated your Care Teams any recent Medical Services you may have received from other providers in the past year.     Assessment:   This is a routine wellness examination for Krystal Monroe.  Hearing/Vision screen Hearing Screening - Comments:: Denies hearing difficulties   Vision Screening - Comments:: Wears rx glasses - up to date with routine eye exams with  [facility]   Goals Addressed               This Visit's Progress     Lose weight (pt-stated)         Depression Screen     04/27/2024    3:39 PM 07/08/2023    7:54 AM 04/21/2023    3:32 PM 11/26/2022   10:23 AM 07/06/2022    8:03 AM 01/18/2022   10:36 AM 01/14/2021    2:52 PM  PHQ 2/9 Scores  PHQ - 2 Score 0 0 0 0 0 0 0  PHQ- 9 Score  2 4  1       Fall Risk     04/27/2024    3:40 PM 07/08/2023    7:54 AM 04/21/2023    3:32 PM 11/26/2022   10:25 AM 07/06/2022    8:04 AM  Fall Risk   Falls in the past year? 0 0 0 0 0  Number falls in past yr: 0 0 0 0 0  Injury with Fall? 0 0 0 0  0  Risk for fall due to : No Fall Risks No Fall Risks No Fall Risks No Fall Risks No Fall Risks  Follow up Falls evaluation completed Falls evaluation completed  Falls prevention discussed Falls evaluation completed      Data saved with a previous flowsheet row definition    MEDICARE RISK AT HOME:  Medicare Risk at Home Any stairs in or around the home?: No If so, are there any without handrails?: No Home free of loose throw rugs in walkways, pet beds, electrical cords, etc?: Yes Adequate lighting in your home to reduce risk of falls?: Yes Life alert?:  No Use of a cane, walker or w/c?: No Grab bars in the bathroom?: No Shower chair or bench in shower?: No Elevated toilet seat or a handicapped toilet?: Yes  TIMED UP AND GO:  Was the test performed?  No  Cognitive Function: 6CIT completed    12/28/2016   10:59 AM  MMSE - Mini Mental State Exam  Not completed: --        04/27/2024    3:41 PM 11/26/2022   10:26 AM 01/18/2022   10:37 AM  6CIT Screen  What Year? 0 points 0 points 0 points  What month? 0 points 0 points 0 points  What time? 0 points 0 points 0 points  Count back from 20 0 points 0 points 0 points  Months in reverse 0 points 0 points 0 points  Repeat phrase 0 points 0 points 2 points  Total Score 0 points 0 points 2 points    Immunizations Immunization History  Administered Date(s) Administered   Fluad Quad(high Dose 65+) 06/13/2019, 07/01/2020, 07/03/2021, 07/06/2022   INFLUENZA, HIGH DOSE SEASONAL PF 04/24/2018, 05/25/2023   Influenza Split 05/22/2012, 05/16/2013   Influenza Whole 08/16/2006   Influenza-Unspecified 04/26/2014, 05/09/2015, 03/22/2016   Moderna SARS-COV2 Booster Vaccination 04/14/2021   PFIZER(Purple Top)SARS-COV-2 Vaccination 09/30/2019, 10/23/2019   Pneumococcal Conjugate-13 10/01/2015   Pneumococcal Polysaccharide-23 12/28/2016   Td 08/16/1998, 10/11/2008   Zoster, Live 02/25/2011    Screening Tests Health Maintenance  Topic Date Due   Zoster Vaccines- Shingrix (1 of 2) 08/01/1999   DTaP/Tdap/Td (3 - Tdap) 10/11/2018   Influenza Vaccine  03/16/2024   COVID-19 Vaccine (4 - 2025-26 season) 04/16/2024   Mammogram  01/24/2025   Medicare Annual Wellness (AWV)  04/27/2025   Colonoscopy  06/23/2025   Pneumococcal Vaccine: 50+ Years  Completed   DEXA SCAN  Completed   Hepatitis C Screening  Completed   HPV VACCINES  Aged Out   Meningococcal B Vaccine  Aged Out    Health Maintenance Items Addressed:   Additional Screening:  Vision Screening: Recommended annual ophthalmology exams  for early detection of glaucoma and other disorders of the eye. Is the patient up to date with their annual eye exam?  Yes  Who is the provider or what is the name of the office in which the patient attends annual eye exams?  [facility]  Dental Screening: Recommended annual dental exams for proper oral hygiene  Community Resource Referral / Chronic Care Management: CRR required this visit?  No   CCM required this visit?  No   Plan:    I have personally reviewed and noted the following in the patient's chart:   Medical and social history Use of alcohol, tobacco or illicit drugs  Current medications and supplements including opioid prescriptions. Patient is not currently taking opioid prescriptions. Functional ability and status Nutritional status Physical activity Advanced directives List of  other physicians Hospitalizations, surgeries, and ER visits in previous 12 months Vitals Screenings to include cognitive, depression, and falls Referrals and appointments  In addition, I have reviewed and discussed with patient certain preventive protocols, quality metrics, and best practice recommendations. A written personalized care plan for preventive services as well as general preventive health recommendations were provided to patient.   Rojelio LELON Blush, LPN   [id number]   After Visit Summary: (MyChart) Due to this being a telephonic visit, the after visit summary with patients personalized plan was offered to patient via MyChart   Notes: Nothing significant to report at this time.

## 2024-04-27 NOTE — Patient Instructions (Addendum)
 Ms. Renderos,  Thank you for taking the time for your Medicare Wellness Visit. I appreciate your continued commitment to your health goals. Please review the care plan we discussed, and feel free to reach out if I can assist you further.  Medicare recommends these wellness visits once per year to help you and your care team stay ahead of potential health issues. These visits are designed to focus on prevention, allowing your provider to concentrate on managing your acute and chronic conditions during your regular appointments.  Please note that Annual Wellness Visits do not include a physical exam. Some assessments may be limited, especially if the visit was conducted virtually. If needed, we may recommend a separate in-person follow-up with your provider.  Ongoing Care Seeing your primary care provider every 3 to 6 months helps us  monitor your health and provide consistent, personalized care.   Referrals If a referral was made during today's visit and you haven't received any updates within two weeks, please contact the referred provider directly to check on the status.  Recommended Screenings:  Health Maintenance  Topic Date Due   Zoster (Shingles) Vaccine (1 of 2) 08/01/1999   DTaP/Tdap/Td vaccine (3 - Tdap) 10/11/2018   Flu Shot  03/16/2024   COVID-19 Vaccine (4 - 2025-26 season) 04/16/2024   Breast Cancer Screening  01/24/2025   Medicare Annual Wellness Visit  04/27/2025   Colon Cancer Screening  06/23/2025   Pneumococcal Vaccine for age over 30  Completed   DEXA scan (bone density measurement)  Completed   Hepatitis C Screening  Completed   HPV Vaccine  Aged Out   Meningitis B Vaccine  Aged Out       04/27/2024    3:41 PM  Advanced Directives  Does Patient Have a Medical Advance Directive? Yes  Type of Estate agent of [place];Living will  Copy of Healthcare Power of Attorney in Chart? No - copy requested   Advance Care Planning is important because  it: Ensures you receive medical care that aligns with your values, goals, and preferences. Provides guidance to your family and loved ones, reducing the emotional burden of decision-making during critical moments.  Vision: Annual vision screenings are recommended for early detection of glaucoma, cataracts, and diabetic retinopathy. These exams can also reveal signs of chronic conditions such as diabetes and high blood pressure.  Dental: Annual dental screenings help detect early signs of oral cancer, gum disease, and other conditions linked to overall health, including heart disease and diabetes.  Please see the attached documents for additional preventive care recommendations.

## 2024-05-02 ENCOUNTER — Other Ambulatory Visit: Payer: Self-pay

## 2024-05-02 DIAGNOSIS — M816 Localized osteoporosis [Lequesne]: Secondary | ICD-10-CM

## 2024-05-02 MED ORDER — DENOSUMAB 60 MG/ML ~~LOC~~ SOSY: 60.0000 mg | PREFILLED_SYRINGE | SUBCUTANEOUS | Status: DC

## 2024-05-02 NOTE — Progress Notes (Signed)
 Pt on bone density report. Order placed for PA.

## 2024-05-17 ENCOUNTER — Ambulatory Visit (HOSPITAL_BASED_OUTPATIENT_CLINIC_OR_DEPARTMENT_OTHER)
Admission: RE | Admit: 2024-05-17 | Discharge: 2024-05-17 | Disposition: A | Discharge status: Home / Self Care | Source: Ambulatory Visit | Attending: Adult Health | Admitting: Adult Health

## 2024-05-17 DIAGNOSIS — R161 Splenomegaly, not elsewhere classified: Secondary | ICD-10-CM | POA: Insufficient documentation

## 2024-05-17 DIAGNOSIS — K5730 Diverticulosis of large intestine without perforation or abscess without bleeding: Secondary | ICD-10-CM | POA: Diagnosis not present

## 2024-05-17 DIAGNOSIS — Z9049 Acquired absence of other specified parts of digestive tract: Secondary | ICD-10-CM | POA: Diagnosis not present

## 2024-05-22 ENCOUNTER — Ambulatory Visit: Payer: Self-pay | Admitting: Adult Health

## 2024-06-13 ENCOUNTER — Ambulatory Visit: Payer: Self-pay

## 2024-06-13 NOTE — Telephone Encounter (Signed)
 Reason for Disposition  [1] MODERATE dizziness (e.g., interferes with normal activities) AND [2] has NOT been evaluated by doctor (or NP/PA) for this  (Exception: Dizziness caused by heat exposure, sudden standing, or poor fluid intake.)  Answer Assessment - Initial Assessment Questions Pt states that she has been having a swimming head for 3 days. Checked her BP this morning, it was normal. She states her husband has a machine to check sugar so she checked that and it was normal. She states she is feeling nauseated she thinks from the dizziness. She is just laying on the couch and just doesn't feel well. States sometimes she feels heat all over. She thinks all of these ongoing symptoms are from the prolia  injection she had. She had back pain and muscle pain for 2 months and then nausea and dizziness intermittently but this has lasted a while.      1. DESCRIPTION: Describe your dizziness.     Feels like her head is swimmy 2. LIGHTHEADED: Do you feel lightheaded? (e.g., somewhat faint, woozy, weak upon standing)     no 3. VERTIGO: Do you feel like either you or the room is spinning or tilting? (i.e., vertigo)     Room not spinning 4. SEVERITY: How bad is it?  Do you feel like you are going to faint? Can you stand and walk?     She can walk. Does not feel like she is going to faint.  5. ONSET:  When did the dizziness begin?     3 days  6. AGGRAVATING FACTORS: Does anything make it worse? (e.g., standing, change in head position)     No triggers 7. HEART RATE: Can you tell me your heart rate? How many beats in 15 seconds?  (Note: Not all patients can do this.)       Normal (states she is a engineer, civil (consulting)) 8. CAUSE: What do you think is causing the dizziness? (e.g., decreased fluids or food, diarrhea, emotional distress, heat exposure, new medicine, sudden standing, vomiting; unknown)     no 9. RECURRENT SYMPTOM: Have you had dizziness before? If Yes, ask: When was the last  time? What happened that time?     no 10. OTHER SYMPTOMS: Do you have any other symptoms? (e.g., fever, chest pain, vomiting, diarrhea, bleeding)       no  Protocols used: Dizziness - Lightheadedness-A-AH

## 2024-06-13 NOTE — Telephone Encounter (Signed)
 FYI Only or Action Required?: FYI only for provider: appointment scheduled on 06/14/24.  Patient was last seen in primary care on 04/10/2024 by Merna Huxley, NP.  Called Nurse Triage reporting Dizziness.  Symptoms began several days ago.  Interventions attempted: Nothing.  Symptoms are: unchanged.  Triage Disposition: See Physician Within 24 Hours  Patient/caregiver understands and will follow disposition?: Yes

## 2024-06-14 ENCOUNTER — Ambulatory Visit (INDEPENDENT_AMBULATORY_CARE_PROVIDER_SITE_OTHER): Admitting: Family Medicine

## 2024-06-14 VITALS — BP 130/72 | HR 70 | Temp 97.3°F | Ht 63.5 in | Wt 162.4 lb

## 2024-06-14 DIAGNOSIS — R42 Dizziness and giddiness: Secondary | ICD-10-CM

## 2024-06-14 DIAGNOSIS — E871 Hypo-osmolality and hyponatremia: Secondary | ICD-10-CM

## 2024-06-14 LAB — BASIC METABOLIC PANEL WITH GFR
BUN: 16 mg/dL (ref 6–23)
CO2: 28 meq/L (ref 19–32)
Calcium: 9.8 mg/dL (ref 8.4–10.5)
Chloride: 98 meq/L (ref 96–112)
Creatinine, Ser: 0.79 mg/dL (ref 0.40–1.20)
GFR: 73.45 mL/min (ref >60.00)
Glucose, Bld: 104 mg/dL — ABNORMAL HIGH (ref 70–99)
Potassium: 3.9 meq/L (ref 3.5–5.1)
Sodium: 135 meq/L (ref 135–145)

## 2024-06-14 MED ORDER — MECLIZINE HCL 25 MG PO TABS: 12.5000 mg | ORAL_TABLET | Freq: Three times a day (TID) | ORAL | 1 refills | Status: AC | PRN

## 2024-06-14 NOTE — Progress Notes (Unsigned)
   Acute Office Visit  Subjective:     Patient ID: Krystal Monroe, female    DOB: [birth year], 73 y.o.   MRN: [id number]  Chief Complaint  Patient presents with   Dizziness    X5 days   Headache    Patient complains of facial pain and pressure since last night    HPI Patient is in today for  ROS       Objective:    BP 130/72   Pulse 70   Temp (!) 97.3 F (36.3 C) (Oral)   Ht 5' 3.5 (1.613 m)   Wt 162 lb 6.4 oz (73.7 kg)   SpO2 97%   BMI 28.32 kg/m  {Vitals History (Optional):23777}  Physical Exam  No results found for any visits on 06/14/24.      Assessment & Plan:   Problem List Items Addressed This Visit   None Visit Diagnoses       Dizziness    -  Primary   Relevant Medications   meclizine (ANTIVERT) 25 MG tablet     Hyponatremia       Relevant Orders   Basic metabolic panel with GFR       Meds ordered this encounter  Medications   meclizine (ANTIVERT) 25 MG tablet    Sig: Take 0.5-1 tablets (12.5-25 mg total) by mouth 3 (three) times daily as needed for dizziness.    Dispense:  30 tablet    Refill:  1    No follow-ups on file.  Heron CHRISTELLA Sharper, MD

## 2024-06-15 ENCOUNTER — Ambulatory Visit: Payer: Self-pay | Admitting: Family Medicine

## 2024-06-20 ENCOUNTER — Ambulatory Visit (INDEPENDENT_AMBULATORY_CARE_PROVIDER_SITE_OTHER): Admitting: Adult Health

## 2024-06-20 ENCOUNTER — Encounter: Payer: Self-pay | Admitting: Adult Health

## 2024-06-20 VITALS — BP 100/70 | HR 94 | Temp 98.3°F | Ht 63.5 in | Wt 161.0 lb

## 2024-06-20 DIAGNOSIS — R42 Dizziness and giddiness: Secondary | ICD-10-CM

## 2024-06-20 MED ORDER — FLUTICASONE PROPIONATE 50 MCG/ACT NA SUSP: 2.0000 | Freq: Every day | NASAL | 0 refills | Status: AC

## 2024-06-20 MED ORDER — ONDANSETRON HCL 4 MG PO TABS: 4.0000 mg | ORAL_TABLET | Freq: Three times a day (TID) | ORAL | 0 refills | Status: DC | PRN

## 2024-06-20 NOTE — Progress Notes (Signed)
 Subjective:    Patient ID: Krystal Monroe, female    DOB: 10-01-1950, 73 y.o.   MRN: [id number]  HPI  73 year old female who  has a past medical history of Anxiety, Asthma, Basal cell carcinoma of nose, Colon polyps, Depression, Difficult airway for intubation, initial encounter (10/15/2020), Diverticulosis of colon, Gallstones, GERD (gastroesophageal reflux disease), Hypertension, Obesity, Sleep apnea, and Trigeminal neuralgia.  Resents to the office today for follow up.  He was seen last week by another provider in the office for dizziness/lightheaded and nausea.  At this time it worsened with head movements and persist even when sitting still.  Her nausea was intermittent.  She denied numbness, tingling, changes to vision, hearing, or speech.  She had experienced similar dizziness episodes in the past that her mother had similar symptoms including vertigo.  Lab work showed a normal BMP and CBC.  Discussed carotid workup with ultrasound or head CT if symptoms did not improve and she was prescribed a course of meclizine  Today she reports that she continues to have dizziness but does not feel like the world is spinning around her.  She continues to have intermittent nausea as well.  Dizziness seems to be pretty constant but does worsen with changes in position such as getting up from a laying down position or bending over to pick something off the floor.  She feels as though the meclizine may have helped but made her more dizzy at the beginning of therapy.  She does endorse sinus pressure under her eyes but has not had any cough, fevers, chills, nasal congestion, runny nose, or ear pain.  She continues to deny numbness, tingling, changes to vision, hearing, or speech.  Review of Systems See HPI   Past Medical History:  Diagnosis Date   Anxiety    son killed in MVA   Asthma    as a child   Basal cell carcinoma of nose    Colon polyps    2011   Depression    son killed in MVA    Difficult airway for intubation, initial encounter 10/15/2020   light wand intubation   Diverticulosis of colon    diverticulitits   Gallstones    GERD (gastroesophageal reflux disease)    Hypertension    Obesity    Sleep apnea    mild per pt-no tx   Trigeminal neuralgia     Social History   Socioeconomic History   Marital status: Married    Spouse name: Not on file   Number of children: 3   Years of education: BSN   Highest education level: Bachelor's degree (e.g., BA, AB, BS)  Occupational History   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   Enjoys reading, needlework, painting            Social Drivers of Health   Financial Resource Strain: Low Risk  (06/14/2024)   Overall Financial Resource Strain (CARDIA)    Difficulty of Paying Living Expenses: Not hard at all  Food Insecurity: No Food Insecurity (06/14/2024)   Hunger Vital Sign    Worried About Running Out of Food in the Last Year: Never true    Ran Out of Food in the Last Year: Never true  Transportation Needs: No Transportation Needs (06/14/2024)   PRAPARE - Administrator, Civil Service (Medical): No    Lack of Transportation (Non-Medical): No  Physical Activity: Sufficiently Active (06/14/2024)   Exercise Vital Sign    Days of Exercise per Week: 5  days    Minutes of Exercise per Session: 30 min  Stress: No Stress Concern Present (06/14/2024)   [employer] of Occupational Health - Occupational Stress Questionnaire    Feeling of Stress: Not at all  Social Connections: Moderately Isolated (06/14/2024)   Social Connection and Isolation Panel    Frequency of Communication with Friends and Family: Once a week    Frequency of Social Gatherings with Friends and Family: Once a week    Attends Religious Services: Never    Database Administrator or Organizations: Yes    Attends Banker Meetings: 1 to 4 times per year    Marital Status: Married  Catering Manager Violence: Not At Risk (04/27/2024)   Humiliation, Afraid, Rape, and Kick questionnaire    Fear of Current or Ex-Partner: No    Emotionally Abused: No    Physically Abused: No    Sexually Abused: No    Past Surgical History:  Procedure Laterality Date   ABDOMINAL HYSTERECTOMY     1997   CATARACT EXTRACTION Left 06/05/2020   per patient   CESAREAN SECTION  1976, 1982, 1984   x 3   CHOLECYSTECTOMY  2002   COLONOSCOPY  last 03/15/2016   EYE SURGERY     ROTATOR CUFF REPAIR Right 2017   TOTAL ABDOMINAL HYSTERECTOMY W/ BILATERAL SALPINGOOPHORECTOMY  2001   UPPER GASTROINTESTINAL ENDOSCOPY      Family History  Problem Relation Age of Onset   Diabetes Maternal Grandmother    Crohn's disease Father    Lung cancer Father    Breast cancer Mother    Osteoporosis Mother    Dementia Mother    Diabetes Mother    Hypertension Mother    Colon cancer Neg Hx    Esophageal cancer Neg Hx    Rectal cancer Neg Hx    Stomach cancer Neg Hx    Colon polyps Neg Hx     Allergies  Allergen Reactions   Iodine-131 Rash   Other Hives, Rash and Other (See Comments)    Magnavist Contrast    Current Outpatient Medications on File Prior to Visit  Medication Sig Dispense Refill   meclizine (ANTIVERT) 25 MG tablet Take 0.5-1 tablets (12.5-25 mg total) by mouth 3 (three)  times daily as needed for dizziness. 30 tablet 1   tretinoin  (RETIN-A ) 0.025 % cream Apply topically at bedtime. 45 g 3   triamterene -hydrochlorothiazide  (DYAZIDE) 37.5-25 MG capsule TAKE 1 CAPSULE BY MOUTH EVERY DAY 90 capsule 2   Current Facility-Administered Medications on File Prior to Visit  Medication Dose Route Frequency Provider Last Rate Last Admin   [START ON 09/12/2024] denosumab  (PROLIA )  injection 60 mg  60 mg Subcutaneous Q6 months Ericson , NP        BP 100/70   Pulse 94   Temp 98.3 F (36.8 C) (Oral)   Ht 5' 3.5 (1.613 m)   Wt 161 lb (73 kg)   SpO2 97%   BMI 28.07 kg/m       Objective:   Physical Exam Vitals and nursing note reviewed.  Constitutional:      Appearance: Normal appearance.  HENT:     Nose: Congestion present. No rhinorrhea.     Right Sinus: Maxillary sinus tenderness present. No frontal sinus tenderness.     Left Sinus: Maxillary sinus tenderness present. No frontal sinus tenderness.  Eyes:     Extraocular Movements:     Right eye: Nystagmus (horizontal) present.     Left eye: Nystagmus (horizontal) present.  Cardiovascular:     Rate and Rhythm: Normal rate and regular rhythm.     Pulses: Normal pulses.     Heart sounds: Normal heart sounds.  Pulmonary:     Effort: Pulmonary effort is normal.     Breath sounds: Normal breath sounds.  Musculoskeletal:        General: Normal range of motion.  Skin:    General: Skin is warm and dry.  Neurological:     General: No focal deficit present.     Mental Status: She is alert and oriented to person, place, and time.  Psychiatric:        Mood and Affect: Mood normal.        Behavior: Behavior normal.        Thought Content: Thought content normal.        Assessment & Plan:  1. Vertigo (Primary) - Did not seem consistent with vertigo likely from viral allergy sinusitis.  Will have her start using Zofran  and and Flonase.  If symptoms do not improve then she would like to be referred to  physical therapy.  We can do a CT of the head or ultrasound of carotid arteries in the future if needed - ondansetron  (ZOFRAN ) 4 MG tablet; Take 1 tablet (4 mg total) by mouth every 8 (eight) hours as needed for nausea or vomiting.  Dispense: 20 tablet; Refill: 0 - fluticasone (FLONASE) 50 MCG/ACT nasal spray; Place 2 sprays into both nostrils daily.  Dispense: 16 g; Refill: 0  Darleene Shape, NP  I personally spent a total of 41 minutes in the care of the patient today including preparing to see the patient, getting/reviewing separately obtained history, performing a medically appropriate exam/evaluation, counseling and educating, placing orders, documenting clinical information in the EHR, and listening .

## 2024-07-04 ENCOUNTER — Encounter: Payer: Self-pay | Admitting: Adult Health

## 2024-07-04 ENCOUNTER — Other Ambulatory Visit: Payer: Self-pay | Admitting: Adult Health

## 2024-07-04 DIAGNOSIS — R42 Dizziness and giddiness: Secondary | ICD-10-CM

## 2024-07-10 ENCOUNTER — Ambulatory Visit: Payer: PPO | Admitting: Adult Health

## 2024-07-10 ENCOUNTER — Encounter: Payer: Self-pay | Admitting: Adult Health

## 2024-07-10 VITALS — BP 122/74 | HR 75 | Temp 98.7°F | Ht 63.5 in | Wt 160.8 lb

## 2024-07-10 DIAGNOSIS — M816 Localized osteoporosis [Lequesne]: Secondary | ICD-10-CM

## 2024-07-10 DIAGNOSIS — E559 Vitamin D deficiency, unspecified: Secondary | ICD-10-CM

## 2024-07-10 DIAGNOSIS — F32A Depression, unspecified: Secondary | ICD-10-CM

## 2024-07-10 DIAGNOSIS — I10 Essential (primary) hypertension: Secondary | ICD-10-CM

## 2024-07-10 DIAGNOSIS — Z0000 Encounter for general adult medical examination without abnormal findings: Secondary | ICD-10-CM | POA: Diagnosis not present

## 2024-07-10 DIAGNOSIS — R7303 Prediabetes: Secondary | ICD-10-CM

## 2024-07-10 DIAGNOSIS — E785 Hyperlipidemia, unspecified: Secondary | ICD-10-CM | POA: Diagnosis not present

## 2024-07-10 LAB — CBC WITH DIFFERENTIAL/PLATELET
Basophils Absolute: 0 K/uL (ref 0.0–0.1)
Basophils Relative: 0.5 % (ref 0.0–3.0)
Eosinophils Absolute: 0.1 K/uL (ref 0.0–0.7)
Eosinophils Relative: 1.1 % (ref 0.0–5.0)
HCT: 42.5 % (ref 36.0–46.0)
Hemoglobin: 14.1 g/dL (ref 12.0–15.0)
Lymphocytes Relative: 21.5 % (ref 12.0–46.0)
Lymphs Abs: 1.6 K/uL (ref 0.7–4.0)
MCHC: 33.2 g/dL (ref 30.0–36.0)
MCV: 86 fl (ref 78.0–100.0)
Monocytes Absolute: 0.4 K/uL (ref 0.1–1.0)
Monocytes Relative: 5.9 % (ref 3.0–12.0)
Neutro Abs: 5.2 K/uL (ref 1.4–7.7)
Neutrophils Relative %: 71 % (ref 43.0–77.0)
Platelets: 231 K/uL (ref 150.0–400.0)
RBC: 4.95 Mil/uL (ref 3.87–5.11)
RDW: 13.9 % (ref 11.5–15.5)
WBC: 7.3 K/uL (ref 4.0–10.5)

## 2024-07-10 LAB — COMPREHENSIVE METABOLIC PANEL WITH GFR
ALT: 13 U/L (ref 0–35)
AST: 15 U/L (ref 0–37)
Albumin: 4.6 g/dL (ref 3.5–5.2)
Alkaline Phosphatase: 42 U/L (ref 39–117)
BUN: 16 mg/dL (ref 6–23)
CO2: 28 meq/L (ref 19–32)
Calcium: 9.7 mg/dL (ref 8.4–10.5)
Chloride: 101 meq/L (ref 96–112)
Creatinine, Ser: 0.8 mg/dL (ref 0.40–1.20)
GFR: 72.32 mL/min (ref >60.00)
Glucose, Bld: 100 mg/dL — ABNORMAL HIGH (ref 70–99)
Potassium: 3.8 meq/L (ref 3.5–5.1)
Sodium: 136 meq/L (ref 135–145)
Total Bilirubin: 1 mg/dL (ref 0.2–1.2)
Total Protein: 7.4 g/dL (ref 6.0–8.3)

## 2024-07-10 LAB — LIPID PANEL
Cholesterol: 185 mg/dL (ref 0–200)
HDL: 40.5 mg/dL (ref >39.00)
LDL Cholesterol: 98 mg/dL (ref 0–99)
NonHDL: 144.23
Total CHOL/HDL Ratio: 5
Triglycerides: 230 mg/dL — ABNORMAL HIGH (ref 0.0–149.0)
VLDL: 46 mg/dL — ABNORMAL HIGH (ref 0.0–40.0)

## 2024-07-10 LAB — HEMOGLOBIN A1C: Hgb A1c MFr Bld: 5.8 % (ref 4.6–6.5)

## 2024-07-10 LAB — VITAMIN D 25 HYDROXY (VIT D DEFICIENCY, FRACTURES): VITD: 35.89 ng/mL (ref 30.00–100.00)

## 2024-07-10 LAB — TSH: TSH: 1.33 u[IU]/mL (ref 0.35–5.50)

## 2024-07-10 MED ORDER — TRIAMTERENE-HCTZ 37.5-25 MG PO CAPS: 1.0000 | ORAL_CAPSULE | Freq: Every day | ORAL | 3 refills | Status: AC

## 2024-07-10 NOTE — Progress Notes (Signed)
 Subjective:    Patient ID: Krystal Monroe, female    DOB: Aug 26, 1950, 73 y.o.   MRN: [id number]  HPI Patient presents for yearly preventative medicine examination. She is a pleasant 73 year old female who  has a past medical history of Anxiety, Asthma, Basal cell carcinoma of nose, Colon polyps, Depression, Difficult airway for intubation, initial encounter (10/15/2020), Diverticulosis of colon, Gallstones, GERD (gastroesophageal reflux disease), Hypertension, Obesity, Sleep apnea, and Trigeminal neuralgia.  Hypertension-managed with Dyazide 37.5-25 mg daily.  She does monitor her blood pressure at home and currently gets readings in the 110s to 120s systolic.  She denies dizziness, lightheadedness, chest pain, or shortness of breath BP Readings from Last 3 Encounters:  07/10/24 122/74  06/20/24 100/70  06/14/24 130/72   Depression- she restarted Lexapro  10mg  two days ago.   Vitamin D  deficiency-takes 5000 units daily Last vitamin D  Last vitamin D  Lab Results  Component Value Date   VD25OH 32.95 03/01/2024   Pre diabetes- not currently on medication. She wanted to work on lifestyle modifications.  Lab Results  Component Value Date   HGBA1C 5.9 07/08/2023   HGBA1C 6.3 07/06/2022   HGBA1C 6.1 07/03/2021   Hyperlipidemia - has been prescribed Liptor in the past but did not start taking it due to changing her diet. She did have a CT Coronary Calcium  scoring done in Jan 2025 which showed Coronary calcium  score of 5. This was 32nd percentile for age-,race-, and sex-matched controls. Trivial aortic root atherosclerosis. Lab Results  Component Value Date   CHOL 183 07/08/2023   HDL 37.80 (L) 07/08/2023   LDLCALC 113 (H) 07/08/2023   LDLDIRECT 112.0 07/06/2022   TRIG 164.0 (H) 07/08/2023   CHOLHDL 5 07/08/2023   Osteoporosis - T score of -2.5 in LFN in Jan 2025. At this time she was started on Prolia  60mg  Q6 months   Vertigo - she has some good days and some bad days. She has an  appointment with vestibular rehab in less than a week. She will use Antivert  PRN.   All immunizations and health maintenance protocols were reviewed with the patient and needed orders were placed.  Appropriate screening laboratory values were ordered for the patient including screening of hyperlipidemia, renal function and hepatic function.  Medication reconciliation,  past medical history, social history, problem list and allergies were reviewed in detail with the patient  Goals were established with regard to weight loss, exercise, and  diet in compliance with medications. She does eat healthy and has been going to the gym on a routine basis.  Wt Readings from Last 3 Encounters:  07/10/24 160 lb 12.8 oz (72.9 kg)  06/20/24 161 lb (73 kg)  06/14/24 162 lb 6.4 oz (73.7 kg)   She is up to date on routine colon cancer screening, mammograms, and Gyn care   Review of Systems  Constitutional: Negative.   HENT: Negative.    Eyes: Negative.   Respiratory: Negative.    Cardiovascular: Negative.   Gastrointestinal: Negative.   Endocrine: Negative.   Genitourinary: Negative.   Musculoskeletal: Negative.   Skin: Negative.   Allergic/Immunologic: Negative.   Neurological: Negative.   Hematological: Negative.   Psychiatric/Behavioral: Negative.     Past Medical History:  Diagnosis Date   Anxiety    son killed in MVA   Asthma    as a child   Basal cell carcinoma of nose    Colon polyps    2011   Depression    son killed  in MVA   Difficult airway for intubation, initial encounter 10/15/2020   light wand intubation   Diverticulosis of colon    diverticulitits   Gallstones    GERD (gastroesophageal reflux disease)    Hypertension    Obesity    Sleep apnea    mild per pt-no tx   Trigeminal neuralgia     Social History   Socioeconomic History   Marital status: Married    Spouse name: Not on file   Number of children: 3   Years of education: BSN   Highest education level:  Bachelor's degree (e.g., BA, AB, BS)  Occupational History   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   Enjoys reading, needlework, painting            Social Drivers of Health   Financial Resource Strain: Low Risk  (06/14/2024)   Overall Financial Resource Strain (CARDIA)    Difficulty of Paying Living Expenses: Not hard at all  Food Insecurity: No Food Insecurity (06/14/2024)   Hunger Vital Sign    Worried About Running Out of Food in the Last Year: Never true    Ran Out of Food in the Last Year: Never true  Transportation Needs: No Transportation Needs (06/14/2024)   PRAPARE - Administrator, Civil Service (Medical): No    Lack of Transportation (Non-Medical): No  Physical Activity: Sufficiently Active (06/14/2024)   Exercise Vital Sign    Days of Exercise per Week: 5 days    Minutes of Exercise per Session: 30 min  Stress: No Stress Concern Present (06/14/2024)   [employer] of Occupational Health - Occupational Stress Questionnaire    Feeling of Stress: Not at all  Social Connections: Moderately Isolated (06/14/2024)   Social Connection and Isolation Panel    Frequency of Communication with Friends and Family: Once a week    Frequency of Social Gatherings with Friends and Family: Once a week    Attends Religious  Services: Never    Database Administrator or Organizations: Yes    Attends Banker Meetings: 1 to 4 times per year    Marital Status: Married  Catering Manager Violence: Not At Risk (04/27/2024)   Humiliation, Afraid, Rape, and Kick questionnaire    Fear of Current or Ex-Partner: No    Emotionally Abused: No    Physically Abused: No    Sexually Abused: No    Past Surgical History:  Procedure Laterality Date   ABDOMINAL HYSTERECTOMY     1997   CATARACT EXTRACTION Left 06/05/2020   per patient   CESAREAN SECTION  1976, 1982, 1984   x 3   CHOLECYSTECTOMY  2002  COLONOSCOPY  last 03/15/2016   EYE SURGERY     ROTATOR CUFF REPAIR Right 2017   TOTAL ABDOMINAL HYSTERECTOMY W/ BILATERAL SALPINGOOPHORECTOMY  2001   UPPER GASTROINTESTINAL ENDOSCOPY      Family History  Problem Relation Age of Onset   Diabetes Maternal Grandmother    Crohn's disease Father    Lung cancer Father    Breast cancer Mother    Osteoporosis Mother    Dementia Mother    Diabetes Mother    Hypertension Mother    Colon cancer Neg Hx    Esophageal cancer Neg Hx    Rectal cancer Neg Hx    Stomach cancer Neg Hx    Colon polyps Neg Hx     Allergies  Allergen Reactions   Iodine-131 Rash   Other Hives, Rash and Other (See Comments)    Magnavist Contrast    Current Outpatient Medications on File Prior to Visit  Medication Sig Dispense Refill   fluticasone  (FLONASE ) 50 MCG/ACT nasal spray Place 2 sprays into both nostrils daily. 16 g 0   meclizine  (ANTIVERT ) 25 MG tablet Take 0.5-1 tablets (12.5-25 mg total) by mouth 3 (three) times daily as needed for dizziness. 30 tablet 1   ondansetron  (ZOFRAN ) 4 MG tablet Take 1 tablet (4 mg total) by mouth every 8 (eight) hours as needed for nausea or vomiting. 20 tablet 0   tretinoin  (RETIN-A ) 0.025 % cream Apply topically at bedtime. 45 g 3   triamterene -hydrochlorothiazide  (DYAZIDE) 37.5-25 MG capsule TAKE 1 CAPSULE BY MOUTH EVERY DAY 90 capsule 2    Current Facility-Administered Medications on File Prior to Visit  Medication Dose Route Frequency Provider Last Rate Last Admin   [START ON 09/12/2024] denosumab  (PROLIA ) injection 60 mg  60 mg Subcutaneous Q6 months Kavon Valenza, NP        BP 122/74   Pulse 75   Temp 98.7 F (37.1 C)   Ht 5' 3.5 (1.613 m)   Wt 160 lb 12.8 oz (72.9 kg)   SpO2 97%   BMI 28.04 kg/m       Objective:   Physical Exam Vitals and nursing note reviewed.  Constitutional:      General: She is not in acute distress.    Appearance: Normal appearance. She is not ill-appearing.  HENT:     Head: Normocephalic and atraumatic.     Right Ear: Tympanic membrane, ear canal and external ear normal. There is no impacted cerumen.     Left Ear: Tympanic membrane, ear canal and external ear normal. There is no impacted cerumen.     Nose: Nose normal. No congestion or rhinorrhea.     Mouth/Throat:     Mouth: Mucous membranes are moist.     Pharynx: Oropharynx is clear.  Eyes:     Extraocular Movements: Extraocular movements intact.     Conjunctiva/sclera: Conjunctivae normal.     Pupils: Pupils are equal, round, and reactive to light.  Neck:     Vascular: No carotid bruit.  Cardiovascular:     Rate and Rhythm: Normal rate and regular rhythm.     Pulses: Normal pulses.     Heart sounds: No murmur heard.    No friction rub. No gallop.  Pulmonary:     Effort: Pulmonary effort is normal.     Breath sounds: Normal breath sounds.  Abdominal:     General: Abdomen is flat. Bowel sounds are normal. There is no distension.     Palpations: Abdomen is soft. There  is no mass.     Tenderness: There is no abdominal tenderness. There is no guarding or rebound.     Hernia: No hernia is present.  Musculoskeletal:        General: Normal range of motion.     Cervical back: Normal range of motion and neck supple.  Lymphadenopathy:     Cervical: No cervical adenopathy.  Skin:    General: Skin is warm and dry.      Capillary Refill: Capillary refill takes less than 2 seconds.  Neurological:     General: No focal deficit present.     Mental Status: She is alert and oriented to person, place, and time.  Psychiatric:        Mood and Affect: Mood normal.        Behavior: Behavior normal.        Thought Content: Thought content normal.        Judgment: Judgment normal.       Assessment & Plan:  1. Routine general medical examination at a health care facility (Primary) Today patient counseled on age appropriate routine health concerns for screening and prevention, each reviewed and up to date or declined. Immunizations reviewed and up to date or declined. Labs ordered and reviewed. Risk factors for depression reviewed and negative. Hearing function and visual acuity are intact. ADLs screened and addressed as needed. Functional ability and level of safety reviewed and appropriate. Education, counseling and referrals performed based on assessed risks today. Patient provided with a copy of personalized plan for preventive services. - Continue to eat healthy and exercise -  Follow up in one year or sooner if needed  2. Essential hypertension - Well controlled. No change in mediation  - CBC with Differential/Platelet; Future - Comprehensive metabolic panel with GFR; Future - Lipid panel; Future - TSH; Future - triamterene -hydrochlorothiazide  (DYAZIDE) 37.5-25 MG capsule; Take 1 each (1 capsule total) by mouth daily.  Dispense: 90 capsule; Refill: 3  3. Depression, unspecified depression type - Continue with Lexapro  10 mg   4. Vitamin D  deficiency - Continue with Vitamin D  supplements  - CBC with Differential/Platelet; Future - Comprehensive metabolic panel with GFR; Future - Lipid panel; Future - TSH; Future - VITAMIN D  25 Hydroxy (Vit-D Deficiency, Fractures); Future  5. Prediabetes - Consider adding Metformin  - CBC with Differential/Platelet; Future - Comprehensive metabolic panel with GFR;  Future - Lipid panel; Future - TSH; Future - Hemoglobin A1c; Future  6. Hyperlipidemia, unspecified hyperlipidemia type - Consider statin  - CBC with Differential/Platelet; Future - Comprehensive metabolic panel with GFR; Future - Lipid panel; Future - TSH; Future  7. Localized osteoporosis without current pathological fracture - Continue with Prolia  injections  - CBC with Differential/Platelet; Future - Comprehensive metabolic panel with GFR; Future - Lipid panel; Future - TSH; Future - VITAMIN D  25 Hydroxy (Vit-D Deficiency, Fractures); Future   Darleene Shape, NP

## 2024-07-11 ENCOUNTER — Other Ambulatory Visit: Payer: Self-pay | Admitting: Adult Health

## 2024-07-11 ENCOUNTER — Ambulatory Visit: Payer: Self-pay | Admitting: Adult Health

## 2024-07-11 MED ORDER — ROSUVASTATIN CALCIUM 5 MG PO TABS: 5.0000 mg | ORAL_TABLET | Freq: Every day | ORAL | 3 refills | Status: AC

## 2024-07-11 MED ORDER — VITAMIN D3 125 MCG (5000 UT) PO CAPS: 5000.0000 [IU] | ORAL_CAPSULE | Freq: Every day | ORAL | 3 refills | Status: AC

## 2024-07-16 ENCOUNTER — Encounter (HOSPITAL_COMMUNITY): Payer: Self-pay

## 2024-07-16 ENCOUNTER — Ambulatory Visit (HOSPITAL_COMMUNITY): Attending: Adult Health

## 2024-07-16 DIAGNOSIS — R42 Dizziness and giddiness: Secondary | ICD-10-CM | POA: Diagnosis present

## 2024-07-16 DIAGNOSIS — R2681 Unsteadiness on feet: Secondary | ICD-10-CM | POA: Insufficient documentation

## 2024-07-16 DIAGNOSIS — M6281 Muscle weakness (generalized): Secondary | ICD-10-CM | POA: Diagnosis present

## 2024-07-16 NOTE — Therapy (Signed)
 OUTPATIENT PHYSICAL THERAPY VESTIBULAR EVALUATION     Patient Name: Krystal Monroe MRN: [id number] DOB:02/11/1951, 73 y.o., female Today's Date: 07/16/2024  END OF SESSION:  PT End of Session - 07/16/24 1418     Visit Number 1    Number of Visits 8    Date for Recertification  10/12/24    Authorization Type [employer]    Authorization Time Period no authorization required    PT Start Time 1418    PT Stop Time 1458    PT Time Calculation (min) 40 min    Activity Tolerance Patient tolerated treatment well    Behavior During Therapy [facility] for tasks assessed/performed          Past Medical History:  Diagnosis Date   Anxiety    son killed in MVA   Asthma    as a child   Basal cell carcinoma of nose    Colon polyps    2011   Depression    son killed in MVA   Difficult airway for intubation, initial encounter 10/15/2020   light wand intubation   Diverticulosis of colon    diverticulitits   Gallstones    GERD (gastroesophageal reflux disease)    Hypertension    Obesity    Sleep apnea    mild per pt-no tx   Trigeminal neuralgia    Past Surgical History:  Procedure Laterality Date   ABDOMINAL HYSTERECTOMY     1997   CATARACT EXTRACTION Left 06/05/2020   per patient   CESAREAN SECTION  1976, 1982, 1984   x 3   CHOLECYSTECTOMY  2002   COLONOSCOPY  last 03/15/2016   EYE SURGERY     ROTATOR CUFF REPAIR Right 2017   TOTAL ABDOMINAL HYSTERECTOMY W/ BILATERAL SALPINGOOPHORECTOMY  2001   UPPER GASTROINTESTINAL ENDOSCOPY     Patient Active Problem List   Diagnosis Date Noted   Class 1 obesity due to excess calories without serious comorbidity with body mass index (BMI) of 31.0 to 31.9 in adult 06/15/2018   Loud snoring 06/15/2018   RLS (restless legs syndrome) 06/15/2018   Right trigeminal neuralgia 06/15/2018   Essential hypertension 09/20/2016   History of colonic polyps 02/02/2016   Diverticulitis of intestine without perforation or abscess without  bleeding 02/02/2016   Routine general medical examination at a health care facility 10/01/2015   History of skin cancer 10/01/2015   GERD 12/19/2009   HIATAL HERNIA 12/19/2009   History of colonic polyps 12/19/2009   OSA (obstructive sleep apnea) 12/03/2009   NEURALGIA, TRIGEMINAL 08/19/2008   Depression 05/24/2007   Diverticulosis of large intestine 05/24/2007   WEIGHT GAIN 05/24/2007    PCP: Merna Huxley, NP  REFERRING PROVIDER: Merna Huxley, NP   REFERRING DIAG: Vertigo   THERAPY DIAG:  Dizziness and giddiness  Unsteadiness on feet  Muscle weakness (generalized)  ONSET DATE: 5 weeks  Rationale for Evaluation and Treatment: Rehabilitation  SUBJECTIVE:   SUBJECTIVE STATEMENT: Patient reports that she has been having some dizziness that has been going on for about 5 weeks. There was no known cause to her symptoms. She had dizziness like this before, but this it not as severe. However, this has not gone away. She has tried using medication, but this did not to help any. She feels that her symptoms are around a 4-5/10 right now and that is where it typically stays.  Pt accompanied by: self  PERTINENT HISTORY: Hypertension, history of anxiety and depression, and history of cancer  PAIN:  Are you having pain? No  PRECAUTIONS: None  RED FLAGS: None   WEIGHT BEARING RESTRICTIONS: No  FALLS: Has patient fallen in last 6 months? No  LIVING ENVIRONMENT: Lives with: lives with their spouse Lives in: House/apartment Has following equipment at home: None  PLOF: Independent  PATIENT GOALS: reduced dizziness  OBJECTIVE:  Note: Objective measures were completed at Evaluation unless otherwise noted.  COGNITION: Overall cognitive status: Within functional limits for tasks assessed   SENSATION: Patient reports no numbness or tingling reported   EDEMA:  No edema observed  Cervical ROM:  No significant deficits observed   STRENGTH:   LOWER EXTREMITY MMT:    MMT Right eval Left eval  Hip flexion 4-5 4-/5  Hip abduction    Hip adduction    Hip internal rotation    Hip external rotation    Knee flexion 4/5 4/5  Knee extension 4-/5 4-/5  Ankle dorsiflexion 4+/5 4+/5  Ankle plantarflexion    Ankle inversion    Ankle eversion    (Blank rows = not tested)  TRANSFERS: Assistive device utilized: None  Sit to stand: Complete Independence Stand to sit: Complete Independence GAIT: Assistive device utilized: None Level of assistance: Complete Independence Comments:   FUNCTIONAL TESTS:  5 times sit to stand: To be assessed at next appointment Timed up and go (TUG): To be assessed at next appointment for 2 minute walk test: To be assessed at next appointment  PATIENT SURVEYS:  ABC scale: The Activities-Specific Balance Confidence (ABC) Scale 0% 10 20 30  40 50 60 70 80 90 100% No confidence<->completely confident  "How confident are you that you will not lose your balance or become unsteady when you . . .   Date tested 07/16/24  Walk around the house 100%  2. Walk up or down stairs 100%  3. Bend over and pick up a slipper from in front of a closet floor 80%  4. Reach for a small can off a shelf at eye level 100%  5. Stand on tip toes and reach for something above your head 100%  6. Stand on a chair and reach for something 70%  7. Sweep the floor 100%  8. Walk outside the house to a car parked in the driveway 100%  9. Get into or out of a car 100%  10. Walk across a parking lot to the mall 100%  11. Walk up or down a ramp 100%  12. Walk in a crowded mall where people rapidly walk past you 100%  13. Are bumped into by people as you walk through the mall 50%  14. Step onto or off of an escalator while you are holding onto the railing 80%  15. Step onto or off an escalator while holding onto parcels such that you cannot hold onto the railing 80%  16. Walk outside on icy sidewalks 0%  Total: #/16  85%     VESTIBULAR  ASSESSMENT:  SYMPTOM BEHAVIOR:  Subjective history: see above   Non-Vestibular symptoms: none  Type of dizziness: Imbalance (Disequilibrium) and Funny feeling in the head  Frequency: every day  Duration: constant  Aggravating factors: Induced by motion: turning head quickly  Relieving factors: no known relieving factors  Progression of symptoms: better or staying the same  OCULOMOTOR EXAM:  Ocular Alignment: normal  Ocular ROM: No Limitations  Spontaneous Nystagmus: absent  Gaze-Induced Nystagmus: absent  Smooth Pursuits: intact  VESTIBULAR - OCULAR REFLEX:   Head-Impulse Test: HIT Right: negative  HIT Left: negative    POSITIONAL TESTING: Right Dix-Hallpike: no nystagmus able to be observed, but symptoms reproduced Left Dix-Hallpike: no nystagmus                                                                                                                           TREATMENT DATE:  07/16/24: PT evaluation and patient education  Canalith Repositioning:  Epley Right: Number of Reps: 1, Response to Treatment: symptoms improved, and Comment: Patient reported improved symptoms once standing and walking  PATIENT EDUCATION: Education details: Plan of care, prognosis, positional testing and treatment, objective findings, and goals for physical therapy Person educated: Patient Education method: Explanation and Demonstration Education comprehension: verbalized understanding  HOME EXERCISE PROGRAM:  GOALS: Goals reviewed with patient? Yes  LONG TERM GOALS: Target date: 08/13/24  Patient will be independent with her HEP. Baseline:  Goal status: INITIAL  2.  Patient will report at least a 50% improvement in her symptoms. Baseline:  Goal status: INITIAL  3.  Patient will experience at least a 8% improvement in her ABC scale for improved perceived function with her daily activities. Baseline:  Goal status: INITIAL  4.  Patient will report being able to go shopping  without being limited by her familiar symptoms. Baseline:  Goal status: INITIAL  ASSESSMENT:  CLINICAL IMPRESSION: Patient is a 73 y.o. female who was seen today for physical therapy evaluation and treatment for right posterior canal BPPV.  She presented with moderate symptom severity and low irritability.  However, her symptoms were able to be reproduced with a right Dix-Hallpike.  This was followed by an Epley maneuver to treat the right posterior canal with good results.  Recommend that she continue with skilled physical therapy to address her impairments to return to her prior level of function.  OBJECTIVE IMPAIRMENTS: decreased activity tolerance, decreased mobility, difficulty walking, decreased strength, and dizziness.   ACTIVITY LIMITATIONS: transfers and locomotion level  PARTICIPATION LIMITATIONS: shopping, community activity, and yard work  PERSONAL FACTORS: Past/current experiences, Time since onset of injury/illness/exacerbation, and 3+ comorbidities: Hypertension, history of anxiety and depression, and history of cancer are also affecting patient's functional outcome.   REHAB POTENTIAL: Good  CLINICAL DECISION MAKING: Stable/uncomplicated  EVALUATION COMPLEXITY: Low   PLAN:  PT FREQUENCY: 1-2x/week  PT DURATION: 4 weeks  PLANNED INTERVENTIONS: 97164- PT Re-evaluation, 97750- Physical Performance Testing, 97110-Therapeutic exercises, 97530- Therapeutic activity, [id number]- Neuromuscular re-education, 97535- Self Care, [id number]- Manual therapy, [phone]- Canalith repositioning, Patient/Family education, Balance training, Spinal mobilization, Cryotherapy, and Moist heat  PLAN FOR NEXT SESSION: Epley maneuver for right posterior canal, functional testing as listed above, and provide HEP   Lacinda JAYSON Fass, PT 07/16/2024, 6:42 PM

## 2024-07-20 ENCOUNTER — Ambulatory Visit (HOSPITAL_COMMUNITY)

## 2024-07-20 ENCOUNTER — Encounter (HOSPITAL_COMMUNITY): Payer: Self-pay

## 2024-07-20 DIAGNOSIS — R42 Dizziness and giddiness: Secondary | ICD-10-CM

## 2024-07-20 DIAGNOSIS — M6281 Muscle weakness (generalized): Secondary | ICD-10-CM

## 2024-07-20 DIAGNOSIS — R2681 Unsteadiness on feet: Secondary | ICD-10-CM

## 2024-07-20 NOTE — Therapy (Signed)
 OUTPATIENT PHYSICAL THERAPY VESTIBULAR TREATMENT     Patient Name: Krystal Monroe MRN: [id number] DOB:06/03/1951, 73 y.o., female Today's Date: 07/20/2024  END OF SESSION:  PT End of Session - 07/20/24 1507     Visit Number 2    Number of Visits 8    Date for Recertification  10/12/24    Authorization Type [employer]    Authorization Time Period no authorization required    PT Start Time 1507    PT Stop Time 1540    PT Time Calculation (min) 33 min    Activity Tolerance Patient tolerated treatment well    Behavior During Therapy [facility] for tasks assessed/performed           Past Medical History:  Diagnosis Date   Anxiety    son killed in MVA   Asthma    as a child   Basal cell carcinoma of nose    Colon polyps    2011   Depression    son killed in MVA   Difficult airway for intubation, initial encounter 10/15/2020   light wand intubation   Diverticulosis of colon    diverticulitits   Gallstones    GERD (gastroesophageal reflux disease)    Hypertension    Obesity    Sleep apnea    mild per pt-no tx   Trigeminal neuralgia    Past Surgical History:  Procedure Laterality Date   ABDOMINAL HYSTERECTOMY     1997   CATARACT EXTRACTION Left 06/05/2020   per patient   CESAREAN SECTION  1976, 1982, 1984   x 3   CHOLECYSTECTOMY  2002   COLONOSCOPY  last 03/15/2016   EYE SURGERY     ROTATOR CUFF REPAIR Right 2017   TOTAL ABDOMINAL HYSTERECTOMY W/ BILATERAL SALPINGOOPHORECTOMY  2001   UPPER GASTROINTESTINAL ENDOSCOPY     Patient Active Problem List   Diagnosis Date Noted   Class 1 obesity due to excess calories without serious comorbidity with body mass index (BMI) of 31.0 to 31.9 in adult 06/15/2018   Loud snoring 06/15/2018   RLS (restless legs syndrome) 06/15/2018   Right trigeminal neuralgia 06/15/2018   Essential hypertension 09/20/2016   History of colonic polyps 02/02/2016   Diverticulitis of intestine without perforation or abscess without  bleeding 02/02/2016   Routine general medical examination at a health care facility 10/01/2015   History of skin cancer 10/01/2015   GERD 12/19/2009   HIATAL HERNIA 12/19/2009   History of colonic polyps 12/19/2009   OSA (obstructive sleep apnea) 12/03/2009   NEURALGIA, TRIGEMINAL 08/19/2008   Depression 05/24/2007   Diverticulosis of large intestine 05/24/2007   WEIGHT GAIN 05/24/2007    PCP: Merna Huxley, NP  REFERRING PROVIDER: Merna Huxley, NP   REFERRING DIAG: Vertigo   THERAPY DIAG:  Dizziness and giddiness  Unsteadiness on feet  Muscle weakness (generalized)  ONSET DATE: 5 weeks  Rationale for Evaluation and Treatment: Rehabilitation  SUBJECTIVE:   SUBJECTIVE STATEMENT: Pt states she is doing some better since last visit. Woozy feeling 2/10, has not had to take a meclizine  this week.  Eval: Patient reports that she has been having some dizziness that has been going on for about 5 weeks. There was no known cause to her symptoms. She had dizziness like this before, but this it not as severe. However, this has not gone away. She has tried using medication, but this did not to help any. She feels that her symptoms are around a 4-5/10 right now and  that is where it typically stays.  Pt accompanied by: self  PERTINENT HISTORY: Hypertension, history of anxiety and depression, and history of cancer  PAIN:  Are you having pain? No  PRECAUTIONS: None  RED FLAGS: None   WEIGHT BEARING RESTRICTIONS: No  FALLS: Has patient fallen in last 6 months? No  LIVING ENVIRONMENT: Lives with: lives with their spouse Lives in: House/apartment Has following equipment at home: None  PLOF: Independent  PATIENT GOALS: reduced dizziness  OBJECTIVE:  Note: Objective measures were completed at Evaluation unless otherwise noted.  COGNITION: Overall cognitive status: Within functional limits for tasks assessed   SENSATION: Patient reports no numbness or tingling reported    EDEMA:  No edema observed  Cervical ROM:  No significant deficits observed   STRENGTH:   LOWER EXTREMITY MMT:   MMT Right eval Left eval  Hip flexion 4-5 4-/5  Hip abduction    Hip adduction    Hip internal rotation    Hip external rotation    Knee flexion 4/5 4/5  Knee extension 4-/5 4-/5  Ankle dorsiflexion 4+/5 4+/5  Ankle plantarflexion    Ankle inversion    Ankle eversion    (Blank rows = not tested)  TRANSFERS: Assistive device utilized: None  Sit to stand: Complete Independence Stand to sit: Complete Independence GAIT: Assistive device utilized: None Level of assistance: Complete Independence Comments:   FUNCTIONAL TESTS:  5 times sit to stand: To be assessed at next appointment Timed up and go (TUG): 7.28 seconds 2 minute walk test: To be assessed at next appointment  PATIENT SURVEYS:  ABC scale: The Activities-Specific Balance Confidence (ABC) Scale 0% 10 20 30  40 50 60 70 80 90 100% No confidence<->completely confident  "How confident are you that you will not lose your balance or become unsteady when you . . .   Date tested 07/16/24  Walk around the house 100%  2. Walk up or down stairs 100%  3. Bend over and pick up a slipper from in front of a closet floor 80%  4. Reach for a small can off a shelf at eye level 100%  5. Stand on tip toes and reach for something above your head 100%  6. Stand on a chair and reach for something 70%  7. Sweep the floor 100%  8. Walk outside the house to a car parked in the driveway 100%  9. Get into or out of a car 100%  10. Walk across a parking lot to the mall 100%  11. Walk up or down a ramp 100%  12. Walk in a crowded mall where people rapidly walk past you 100%  13. Are bumped into by people as you walk through the mall 50%  14. Step onto or off of an escalator while you are holding onto the railing 80%  15. Step onto or off an escalator while holding onto parcels such that you cannot hold onto the railing  80%  16. Walk outside on icy sidewalks 0%  Total: #/16  85%     VESTIBULAR ASSESSMENT:  SYMPTOM BEHAVIOR:  Subjective history: see above   Non-Vestibular symptoms: none  Type of dizziness: Imbalance (Disequilibrium) and Funny feeling in the head  Frequency: every day  Duration: constant  Aggravating factors: Induced by motion: turning head quickly  Relieving factors: no known relieving factors  Progression of symptoms: better or staying the same  OCULOMOTOR EXAM:  Ocular Alignment: normal  Ocular ROM: No Limitations  Spontaneous Nystagmus: absent  Gaze-Induced Nystagmus: absent  Smooth Pursuits: intact  VESTIBULAR - OCULAR REFLEX:   Head-Impulse Test: HIT Right: negative HIT Left: negative    POSITIONAL TESTING: Right Dix-Hallpike: no nystagmus able to be observed, but symptoms reproduced Left Dix-Hallpike: no nystagmus                                                                                                                           TREATMENT DATE:  07/20/2024  -R Trenda Shona Grebe testing bilaterally, negative bilaterally -TUG Neuromuscular Re-education: -Walking VORx1, 2 laps down back hall way, pt cued for increased frequency of turns, slight symptoms with horizontal rotations -Seated VORx1, 15 reps -Seated VOR cancellization, 8 reps  07/16/24: PT evaluation and patient education  Canalith Repositioning:  Epley Right: Number of Reps: 1, Response to Treatment: symptoms improved, and Comment: Patient reported improved symptoms once standing and walking  PATIENT EDUCATION: Education details: Plan of care, prognosis, positional testing and treatment, objective findings, and goals for physical therapy Person educated: Patient Education method: Explanation and Demonstration Education comprehension: verbalized understanding  HOME EXERCISE PROGRAM: Access Code: [email or web address] URL: [email or web address] Date: 07/20/2024 Prepared by: Lang Ada  Exercises - Seated Gaze Stabilization with Head Nod  - 1 x daily - 7 x weekly - 3 sets - 1 reps - Seated Gaze Stabilization with Head Rotation  - 1 x daily - 7 x weekly - 3 sets - 1 reps - Seated VOR Cancellation  - 1 x daily - 7 x weekly - 3 sets - 1 reps - Walking Gaze Stabilization Head Nod  - 1 x daily - 7 x weekly - 3 sets - 1 reps - Walking Gaze Stabilization Head Rotation  - 1 x daily - 7 x weekly - 3 sets - 1 reps  GOALS: Goals reviewed with patient? Yes  LONG TERM GOALS: Target date: 08/13/24  Patient will be independent with her HEP. Baseline:  Goal status: INITIAL  2.  Patient will report at least a 50% improvement in her symptoms. Baseline:  Goal status: INITIAL  3.  Patient will experience at least a 8% improvement in her ABC scale for improved perceived function with her daily activities. Baseline:  Goal status: INITIAL  4.  Patient will report being able to go shopping without being limited by her familiar symptoms. Baseline:  Goal status: INITIAL  ASSESSMENT:  CLINICAL IMPRESSION: Patient demonstrates decreased dizziness symptoms, fair LE strength, and WFL gait quality and balance. Patient also demonstrates good completion of VOR activities during today's session. Patient able to initiate dynamic balance and VOR activation exercises today with walking and sitting VOR variations, good performance with verbal cueing. Patient would continue to benefit from skilled physical therapy for decreased dizziness and increased independence with management of dizziness for improved quality of life, improved independence with community ambulation and continued progress towards therapy goals.   Eval: Patient is a 73 y.o. female who was seen today for physical therapy evaluation and treatment for  right posterior canal BPPV.  She presented with moderate symptom severity and low irritability.  However, her symptoms were able to be reproduced with a right Dix-Hallpike.  This  was followed by an Epley maneuver to treat the right posterior canal with good results.  Recommend that she continue with skilled physical therapy to address her impairments to return to her prior level of function.  OBJECTIVE IMPAIRMENTS: decreased activity tolerance, decreased mobility, difficulty walking, decreased strength, and dizziness.   ACTIVITY LIMITATIONS: transfers and locomotion level  PARTICIPATION LIMITATIONS: shopping, community activity, and yard work  PERSONAL FACTORS: Past/current experiences, Time since onset of injury/illness/exacerbation, and 3+ comorbidities: Hypertension, history of anxiety and depression, and history of cancer are also affecting patient's functional outcome.   REHAB POTENTIAL: Good  CLINICAL DECISION MAKING: Stable/uncomplicated  EVALUATION COMPLEXITY: Low   PLAN:  PT FREQUENCY: 1-2x/week  PT DURATION: 4 weeks  PLANNED INTERVENTIONS: 97164- PT Re-evaluation, 97750- Physical Performance Testing, 97110-Therapeutic exercises, 97530- Therapeutic activity, 97112- Neuromuscular re-education, 97535- Self Care, 02859- Manual therapy, [phone]- Canalith repositioning, Patient/Family education, Balance training, Spinal mobilization, Cryotherapy, and Moist heat  PLAN FOR NEXT SESSION: Reassess/ possible discharge   Lang Ada, PT, DPT [facility] Office: [phone] 3:48 PM, 07/20/24

## 2024-07-30 ENCOUNTER — Ambulatory Visit (HOSPITAL_COMMUNITY)

## 2024-08-06 ENCOUNTER — Ambulatory Visit (HOSPITAL_COMMUNITY)

## 2024-08-13 ENCOUNTER — Encounter (HOSPITAL_COMMUNITY): Payer: Self-pay

## 2024-08-13 ENCOUNTER — Ambulatory Visit (HOSPITAL_COMMUNITY)

## 2024-08-13 DIAGNOSIS — R42 Dizziness and giddiness: Secondary | ICD-10-CM

## 2024-08-13 DIAGNOSIS — M6281 Muscle weakness (generalized): Secondary | ICD-10-CM

## 2024-08-13 DIAGNOSIS — R2681 Unsteadiness on feet: Secondary | ICD-10-CM

## 2024-08-13 NOTE — Therapy (Signed)
 " OUTPATIENT PHYSICAL THERAPY VESTIBULAR TREATMENT   PHYSICAL THERAPY DISCHARGE SUMMARY  Visits from Start of Care: 2  Current functional level related to goals / functional outcomes: Lacking, residual dizziness on occasion   Remaining deficits: residual dizziness on occasion   Education / Equipment: Causes of dizziness and importance of HEP compliance.   Patient agrees to discharge. Patient goals were met. Patient is being discharged due to maximized rehab potential.    Patient Name: Krystal Monroe MRN: [id number] DOB:Dec 12, 1950, 73 y.o., female Today's Date: 08/13/2024  END OF SESSION:  PT End of Session - 08/13/24 0901     Visit Number 3    Number of Visits 8    Date for Recertification  10/12/24    Authorization Type [employer]    Authorization Time Period no authorization required    PT Start Time 0902    PT Stop Time 0923    PT Time Calculation (min) 21 min    Activity Tolerance Patient tolerated treatment well    Behavior During Therapy [name] for tasks assessed/performed            Past Medical History:  Diagnosis Date   Anxiety    son killed in MVA   Asthma    as a child   Basal cell carcinoma of nose    Colon polyps    2011   Depression    son killed in MVA   Difficult airway for intubation, initial encounter 10/15/2020   light wand intubation   Diverticulosis of colon    diverticulitits   Gallstones    GERD (gastroesophageal reflux disease)    Hypertension    Obesity    Sleep apnea    mild per pt-no tx   Trigeminal neuralgia    Past Surgical History:  Procedure Laterality Date   ABDOMINAL HYSTERECTOMY     1997   CATARACT EXTRACTION Left 06/05/2020   per patient   CESAREAN SECTION  1976, 1982, 1984   x 3   CHOLECYSTECTOMY  2002   COLONOSCOPY  last 03/15/2016   EYE SURGERY     ROTATOR CUFF REPAIR Right 2017   TOTAL ABDOMINAL HYSTERECTOMY W/ BILATERAL SALPINGOOPHORECTOMY  2001   UPPER GASTROINTESTINAL ENDOSCOPY      Patient Active Problem List   Diagnosis Date Noted   Class 1 obesity due to excess calories without serious comorbidity with body mass index (BMI) of 31.0 to 31.9 in adult 06/15/2018   Loud snoring 06/15/2018   RLS (restless legs syndrome) 06/15/2018   Right trigeminal neuralgia 06/15/2018   Essential hypertension 09/20/2016   History of colonic polyps 02/02/2016   Diverticulitis of intestine without perforation or abscess without bleeding 02/02/2016   Routine general medical examination at a health care facility 10/01/2015   History of skin cancer 10/01/2015   GERD 12/19/2009   HIATAL HERNIA 12/19/2009   History of colonic polyps 12/19/2009   OSA (obstructive sleep apnea) 12/03/2009   NEURALGIA, TRIGEMINAL 08/19/2008   Depression 05/24/2007   Diverticulosis of large intestine 05/24/2007   WEIGHT GAIN 05/24/2007    PCP: Merna Huxley, NP  REFERRING PROVIDER: Merna Huxley, NP   REFERRING DIAG: Vertigo   THERAPY DIAG:  Dizziness and giddiness  Unsteadiness on feet  Muscle weakness (generalized)  ONSET DATE: 5 weeks  Rationale for Evaluation and Treatment: Rehabilitation  SUBJECTIVE:   SUBJECTIVE STATEMENT: Pt states she was doing good for a few days after Pt states she is doing some better since last visit. Woozy feeling  2/10, has not had to take a meclizine  this week.  Eval: Patient reports that she has been having some dizziness that has been going on for about 5 weeks. There was no known cause to her symptoms. She had dizziness like this before, but this it not as severe. However, this has not gone away. She has tried using medication, but this did not to help any. She feels that her symptoms are around a 4-5/10 right now and that is where it typically stays.  Pt accompanied by: self  PERTINENT HISTORY: Hypertension, history of anxiety and depression, and history of cancer  PAIN:  Are you having pain? No  PRECAUTIONS: None  RED FLAGS: None   WEIGHT  BEARING RESTRICTIONS: No  FALLS: Has patient fallen in last 6 months? No  LIVING ENVIRONMENT: Lives with: lives with their spouse Lives in: House/apartment Has following equipment at home: None  PLOF: Independent  PATIENT GOALS: reduced dizziness  OBJECTIVE:  Note: Objective measures were completed at Evaluation unless otherwise noted.  COGNITION: Overall cognitive status: Within functional limits for tasks assessed   SENSATION: Patient reports no numbness or tingling reported   EDEMA:  No edema observed  Cervical ROM:  No significant deficits observed   STRENGTH:   LOWER EXTREMITY MMT:   MMT Right eval Left eval  Hip flexion 4-5 4-/5  Hip abduction    Hip adduction    Hip internal rotation    Hip external rotation    Knee flexion 4/5 4/5  Knee extension 4-/5 4-/5  Ankle dorsiflexion 4+/5 4+/5  Ankle plantarflexion    Ankle inversion    Ankle eversion    (Blank rows = not tested)  TRANSFERS: Assistive device utilized: None  Sit to stand: Complete Independence Stand to sit: Complete Independence GAIT: Assistive device utilized: None Level of assistance: Complete Independence Comments:   FUNCTIONAL TESTS:  5 times sit to stand: To be assessed at next appointment Timed up and go (TUG): 7.28 seconds 2 minute walk test: To be assessed at next appointment  PATIENT SURVEYS:  ABC scale: The Activities-Specific Balance Confidence (ABC) Scale 0% 10 20 30  40 50 60 70 80 90 100% No confidence<->completely confident  How confident are you that you will not lose your balance or become unsteady when you . . .   Date tested 07/16/24  Walk around the house 100%  2. Walk up or down stairs 100%  3. Bend over and pick up a slipper from in front of a closet floor 80%  4. Reach for a small can off a shelf at eye level 100%  5. Stand on tip toes and reach for something above your head 100%  6. Stand on a chair and reach for something 70%  7. Sweep the floor 100%   8. Walk outside the house to a car parked in the driveway 100%  9. Get into or out of a car 100%  10. Walk across a parking lot to the mall 100%  11. Walk up or down a ramp 100%  12. Walk in a crowded mall where people rapidly walk past you 100%  13. Are bumped into by people as you walk through the mall 50%  14. Step onto or off of an escalator while you are holding onto the railing 80%  15. Step onto or off an escalator while holding onto parcels such that you cannot hold onto the railing 80%  16. Walk outside on icy sidewalks 0%  Total: #/16  85%     VESTIBULAR ASSESSMENT:  SYMPTOM BEHAVIOR:  Subjective history: see above   Non-Vestibular symptoms: none  Type of dizziness: Imbalance (Disequilibrium) and Funny feeling in the head  Frequency: every day  Duration: constant  Aggravating factors: Induced by motion: turning head quickly  Relieving factors: no known relieving factors  Progression of symptoms: better or staying the same  OCULOMOTOR EXAM:  Ocular Alignment: normal  Ocular ROM: No Limitations  Spontaneous Nystagmus: absent  Gaze-Induced Nystagmus: absent  Smooth Pursuits: intact  VESTIBULAR - OCULAR REFLEX:   Head-Impulse Test: HIT Right: negative HIT Left: negative    POSITIONAL TESTING: Right Dix-Hallpike: no nystagmus able to be observed, but symptoms reproduced Left Dix-Hallpike: no nystagmus                                                                                                                           TREATMENT DATE:  08/13/2024  -Pt education on POC, follow up with referring, continued HEP(reviewed) compliance and dizziness causes.  07/20/2024  -R [employer] testing bilaterally, negative bilaterally -TUG Neuromuscular Re-education: -Walking VORx1, 2 laps down back hall way, pt cued for increased frequency of turns, slight symptoms with horizontal rotations -Seated VORx1, 15 reps -Seated VOR cancellization, 8 reps  07/16/24: PT  evaluation and patient education  Canalith Repositioning:  Epley Right: Number of Reps: 1, Response to Treatment: symptoms improved, and Comment: Patient reported improved symptoms once standing and walking  PATIENT EDUCATION: Education details: Plan of care, prognosis, positional testing and treatment, objective findings, and goals for physical therapy Person educated: Patient Education method: Explanation and Demonstration Education comprehension: verbalized understanding  HOME EXERCISE PROGRAM: Access Code: [email or web address] URL: [email or web address] Date: 07/20/2024 Prepared by: Lang Ada  Exercises - Seated Gaze Stabilization with Head Nod  - 1 x daily - 7 x weekly - 3 sets - 1 reps - Seated Gaze Stabilization with Head Rotation  - 1 x daily - 7 x weekly - 3 sets - 1 reps - Seated VOR Cancellation  - 1 x daily - 7 x weekly - 3 sets - 1 reps - Walking Gaze Stabilization Head Nod  - 1 x daily - 7 x weekly - 3 sets - 1 reps - Walking Gaze Stabilization Head Rotation  - 1 x daily - 7 x weekly - 3 sets - 1 reps  GOALS: Goals reviewed with patient? Yes  LONG TERM GOALS: Target date: 08/13/24  Patient will be independent with her HEP. Baseline:  Goal status: MET  2.  Patient will report at least a 50% improvement in her symptoms. Baseline:  Goal status: IN PROGRESS  3.  Patient will experience at least a 8% improvement in her ABC scale for improved perceived function with her daily activities. Baseline:  Goal status: IN PROGRESS  4.  Patient will report being able to go shopping without being limited by her familiar symptoms. Baseline:  Goal status: MET  ASSESSMENT:  CLINICAL IMPRESSION: Patient demonstrates  decreased dizziness symptoms, WFL LE strength, and WFL gait quality and balance. Patient also demonstrates good completion of HEP, VOR activities. Reviewed pt POC and possibilities of causes of her dizziness, HEP review and emphasized as VOR seems to be the  main provocation for symptoms. Patient to be discharged at this time due to symptoms being minimal at this time and pt being safe with functional transfers and community ambulation as well as pt being comfortable with HEP independence.   OBJECTIVE IMPAIRMENTS: decreased activity tolerance, decreased mobility, difficulty walking, decreased strength, and dizziness.   ACTIVITY LIMITATIONS: transfers and locomotion level  PARTICIPATION LIMITATIONS: shopping, community activity, and yard work  PERSONAL FACTORS: Past/current experiences, Time since onset of injury/illness/exacerbation, and 3+ comorbidities: Hypertension, history of anxiety and depression, and history of cancer are also affecting patient's functional outcome.   REHAB POTENTIAL: Good  CLINICAL DECISION MAKING: Stable/uncomplicated  EVALUATION COMPLEXITY: Low   PLAN:  PT FREQUENCY: 1-2x/week  PT DURATION: 4 weeks  PLANNED INTERVENTIONS: 97164- PT Re-evaluation, 97750- Physical Performance Testing, 97110-Therapeutic exercises, 97530- Therapeutic activity, 97112- Neuromuscular re-education, 97535- Self Care, 02859- Manual therapy, [phone]- Canalith repositioning, Patient/Family education, Balance training, Spinal mobilization, Cryotherapy, and Moist heat  PLAN FOR NEXT SESSION: discharged   Lang Ada, PT, DPT [facility] Office: [phone] 9:30 AM, 08/13/2024  "

## 2024-08-19 ENCOUNTER — Encounter: Payer: Self-pay | Admitting: Adult Health

## 2024-08-20 ENCOUNTER — Ambulatory Visit (HOSPITAL_COMMUNITY)

## 2024-08-21 ENCOUNTER — Other Ambulatory Visit: Payer: Self-pay | Admitting: Adult Health

## 2024-08-21 MED ORDER — ESCITALOPRAM OXALATE 10 MG PO TABS: 10.0000 mg | ORAL_TABLET | Freq: Every day | ORAL | 1 refills | Status: AC

## 2024-08-22 ENCOUNTER — Telehealth: Payer: Self-pay

## 2024-08-22 ENCOUNTER — Other Ambulatory Visit (HOSPITAL_COMMUNITY): Payer: Self-pay

## 2024-08-22 NOTE — Telephone Encounter (Signed)
 Prolia  VOB initiated via [email or web address]  Next Prolia  inj DUE: 09/12/24

## 2024-08-23 ENCOUNTER — Other Ambulatory Visit (HOSPITAL_COMMUNITY): Payer: Self-pay

## 2024-08-23 NOTE — Telephone Encounter (Signed)
 Pt ready for scheduling for PROLIA  on or after : 09/12/24  Option# 1: Buy/Bill (Office supplied medication)  Out-of-pocket cost due at time of clinic visit: $352  Number of injection/visits approved: ---  Primary: [employer] Prolia  co-insurance: 20% Admin fee co-insurance: 0%  Secondary: --- Prolia  co-insurance:  Admin fee co-insurance:   Medical Benefit Details: Date Benefits were checked: 08/22/24 Deductible: NO/ Coinsurance: 20%/ Admin Fee: 0%  Prior Auth: N/A PA# Expiration Date:   # of doses approved: ----------------------------------------------------------------------- Option# 2- Med Obtained from pharmacy: [facility] PREFERRED FOR PHARMACY BENEFIT  Pharmacy benefit: Copay [phone] (Paid to pharmacy) Admin Fee: 0% (Pay at clinic)  Prior Auth: N/A PA# Expiration Date:   # of doses approved:   If patient wants fill through the pharmacy benefit please send prescription to: [facility], and include estimated need by date in rx notes. Pharmacy will ship medication directly to the office.  Patient NOT eligible for Prolia  Copay Card. Copay Card can make patient's cost as little as $25. Link to apply: [email or web address]://[email or web address]  ** This summary of benefits is an estimation of the patient's out-of-pocket cost. Exact cost may very based on individual plan coverage.

## 2024-08-29 NOTE — Telephone Encounter (Signed)
 Unable to call pt call drops.

## 2024-08-29 NOTE — Telephone Encounter (Signed)
 Patient is ready for scheduling of Prolia  on or after 09/12/2024. Please contact patient for scheduling and advise patient of $352 copay. Please respond with patient appointment date and time when scheduled.

## 2024-08-30 NOTE — Telephone Encounter (Signed)
 Left message to return phone call.

## 2024-08-30 NOTE — Telephone Encounter (Signed)
 Pt notified of co pay. Pt stated that she has been so feeling so bad for 6 months. Pt stated she think it was coming from the Prolia  injections. Pt wants to know if she can stop taking it.

## 2024-08-31 ENCOUNTER — Telehealth: Payer: Self-pay | Admitting: Adult Health

## 2024-08-31 NOTE — Telephone Encounter (Signed)
 Please see other phone note.

## 2024-08-31 NOTE — Telephone Encounter (Signed)
 Patient notified of update  and verbalized understanding.

## 2024-08-31 NOTE — Telephone Encounter (Signed)
 Copied from CRM [phone]. Topic: General - Call Back - No Documentation >> Aug 31, 2024  9:32 AM Gustabo D wrote: Pt is returning a call to her pcp and Marjorie

## 2024-09-03 NOTE — Addendum Note (Signed)
 Addended by: DIONISIO COLLIE PARAS on: 09/03/2024 09:29 AM   Modules accepted: Orders

## 2025-05-03 ENCOUNTER — Ambulatory Visit

## 2025-07-17 ENCOUNTER — Encounter: Admitting: Adult Health
